# Patient Record
Sex: Female | Born: 1980 | Race: Black or African American | Hispanic: No | Marital: Single | Smoking: Former smoker
Health system: Southern US, Community
[De-identification: ages and names within clinical notes are randomized; demographics above are authoritative.]

## PROBLEM LIST (undated history)

## (undated) DIAGNOSIS — T8484XA Pain due to internal orthopedic prosthetic devices, implants and grafts, initial encounter: Secondary | ICD-10-CM

## (undated) DIAGNOSIS — Z8669 Personal history of other diseases of the nervous system and sense organs: Secondary | ICD-10-CM

## (undated) DIAGNOSIS — G709 Myoneural disorder, unspecified: Secondary | ICD-10-CM

## (undated) DIAGNOSIS — F419 Anxiety disorder, unspecified: Secondary | ICD-10-CM

## (undated) DIAGNOSIS — M898X9 Other specified disorders of bone, unspecified site: Secondary | ICD-10-CM

## (undated) DIAGNOSIS — K219 Gastro-esophageal reflux disease without esophagitis: Secondary | ICD-10-CM

## (undated) DIAGNOSIS — H5712 Ocular pain, left eye: Secondary | ICD-10-CM

## (undated) DIAGNOSIS — I10 Essential (primary) hypertension: Secondary | ICD-10-CM

## (undated) DIAGNOSIS — M1990 Unspecified osteoarthritis, unspecified site: Secondary | ICD-10-CM

## (undated) HISTORY — DX: Personal history of other diseases of the nervous system and sense organs: Z86.69

---

## 2001-05-05 ENCOUNTER — Emergency Department (HOSPITAL_COMMUNITY): Admission: EM | Admit: 2001-05-05 | Discharge: 2001-05-05 | Payer: Self-pay | Admitting: Emergency Medicine

## 2004-07-17 ENCOUNTER — Ambulatory Visit: Payer: Self-pay | Admitting: Gastroenterology

## 2004-07-21 ENCOUNTER — Ambulatory Visit: Payer: Self-pay | Admitting: Gastroenterology

## 2004-10-07 ENCOUNTER — Encounter: Admission: RE | Admit: 2004-10-07 | Discharge: 2004-10-07 | Payer: Self-pay | Admitting: Gastroenterology

## 2004-10-13 ENCOUNTER — Encounter: Admission: RE | Admit: 2004-10-13 | Discharge: 2004-10-13 | Payer: Self-pay | Admitting: Gastroenterology

## 2005-12-10 ENCOUNTER — Emergency Department (HOSPITAL_COMMUNITY): Admission: EM | Admit: 2005-12-10 | Discharge: 2005-12-10 | Payer: Self-pay | Admitting: Emergency Medicine

## 2006-03-22 ENCOUNTER — Encounter: Admission: RE | Admit: 2006-03-22 | Discharge: 2006-03-22 | Payer: Self-pay | Admitting: Obstetrics and Gynecology

## 2006-07-12 HISTORY — PX: CHOLECYSTECTOMY: SHX55

## 2006-08-05 ENCOUNTER — Inpatient Hospital Stay (HOSPITAL_COMMUNITY): Admission: AD | Admit: 2006-08-05 | Discharge: 2006-08-07 | Payer: Self-pay | Admitting: Obstetrics and Gynecology

## 2006-11-14 ENCOUNTER — Emergency Department (HOSPITAL_COMMUNITY): Admission: EM | Admit: 2006-11-14 | Discharge: 2006-11-14 | Payer: Self-pay | Admitting: Family Medicine

## 2007-01-09 ENCOUNTER — Emergency Department (HOSPITAL_COMMUNITY): Admission: EM | Admit: 2007-01-09 | Discharge: 2007-01-09 | Payer: Self-pay | Admitting: Family Medicine

## 2007-02-28 ENCOUNTER — Inpatient Hospital Stay (HOSPITAL_COMMUNITY): Admission: EM | Admit: 2007-02-28 | Discharge: 2007-03-03 | Payer: Self-pay | Admitting: Emergency Medicine

## 2007-02-28 HISTORY — PX: ORIF TIBIA FRACTURE: SHX5416

## 2007-02-28 HISTORY — PX: ORIF ANKLE FRACTURE BIMALLEOLAR: SUR920

## 2007-05-17 ENCOUNTER — Ambulatory Visit (HOSPITAL_COMMUNITY): Admission: RE | Admit: 2007-05-17 | Discharge: 2007-05-17 | Payer: Self-pay | Admitting: Orthopaedic Surgery

## 2007-05-17 ENCOUNTER — Ambulatory Visit: Payer: Self-pay | Admitting: Vascular Surgery

## 2007-05-17 ENCOUNTER — Encounter (INDEPENDENT_AMBULATORY_CARE_PROVIDER_SITE_OTHER): Payer: Self-pay | Admitting: Orthopaedic Surgery

## 2007-09-01 ENCOUNTER — Ambulatory Visit (HOSPITAL_BASED_OUTPATIENT_CLINIC_OR_DEPARTMENT_OTHER): Admission: RE | Admit: 2007-09-01 | Discharge: 2007-09-01 | Payer: Self-pay | Admitting: Orthopedic Surgery

## 2007-09-01 HISTORY — PX: TIBIA HARDWARE REMOVAL: SHX2515

## 2007-09-01 HISTORY — PX: KNEE ARTHROSCOPY: SHX127

## 2010-03-10 ENCOUNTER — Ambulatory Visit: Payer: Self-pay | Admitting: Family Medicine

## 2010-04-16 DIAGNOSIS — K219 Gastro-esophageal reflux disease without esophagitis: Secondary | ICD-10-CM | POA: Insufficient documentation

## 2010-06-03 ENCOUNTER — Ambulatory Visit: Payer: Self-pay | Admitting: Family

## 2010-11-24 NOTE — Discharge Summary (Signed)
NAMEJEMEKA, WAGLER              ACCOUNT NO.:  [id number]   MEDICAL RECORD NO.:  [id number]          PATIENT TYPE:  INP   LOCATION:  1617                         FACILITY:  [facility]   PHYSICIAN:  Harvie Junior, M.D.   DATE OF BIRTH:  [birth year]   DATE OF ADMISSION:  02/28/2007  DATE OF DISCHARGE:  03/03/2007                               DISCHARGE SUMMARY   ADMITTING DIAGNOSES:  1. Bi-malleolar right ankle fracture.  2. Right tib-fib fracture.   DISCHARGE DIAGNOSES:  1. Bi-malleolar right ankle fracture.  2. Right tib-fib fracture.   PROCEDURES IN-HOSPITAL:  1. Open reduction, internal fixation/IM nailing of right tibia      fracture February 28, 2007.  2. Open reduction, internal fixation of bi-malleolar fracture/distal      fibula, right ankle, February 28, 2007.   BRIEF HISTORY:  Ms. Hodak is a 30 year old female who was at work,  walking down some stairs.  She tripped and fell, suffering a right mid-  shaft tibia fracture and distal tibial plafond fracture with fracture of  the lateral fibulas, as well as posterior malleolus.  She was brought to  [facility] emergency room where x-rays confirmed the  diagnosis, and it was felt that she needed surgical intervention on her  right lower extremity, because of severity of her fractures.  She was  admitted for this.  Pertinent laboratory studies show a hemoglobin  admission of 13.1, hematocrit 38.2, WBC 15.5, indices within normal  limits.  Her BUN was within normal limits without abnormalities.  X-ray  of the right tib-fib.  She has a spiral-type fracture involving the  distal fibular shaft above the ankle mortis and also two fractures  involving the tibia.  One is in the mid-tibial shaft region, and the  other is distally.  Postoperative x-rays show near anatomic reduction of  the tibial and fibular fractures, status post ORIF.   BRIEF HISTORY:  Ms. Berling came to the [facility] emergency  room where she was  evaluated.  Neurovascular status was intact to the  right lower extremity, and she had a right mid-shaft tibia fracture with  a distal fibular fracture and tibial plafond fracture.  She was felt to  be in need of ORIF of these significant injuries.  She was brought to  the operating for this.  On the day of admission, preoperatively she  given a gram of Ancef prophylactically.  She underwent surgery as well,  described in Dr. Stacy Gardner operative note.  Postoperatively, she was  placed on the PCA morphine pump for pain control, was given Ancef 1 gram  IV q.8 h. x3 doses.  The leg was elevated and iced.  She was seen by  physical therapy for walker ambulation, non-weightbearing on the right  side.  She made somewhat slow progress in physical therapy and had  normal neurovascular status distally, although at one point stated their  toes were a bit numb.  She had no other injuries.  She was continued on  a PCA morphine pump, was gotten out of bed with physical therapy.  Aspirin was used  for DVT prophylaxis.  On post-op day #2, she was  without complaints.  She was afebrile.  Her vital signs were stable.  Her lungs clear to auscultation.  Her right lower extremity had  neurovascular status intact.  Her IV was converted to a heparin lock,  and a PCA morphine pump was continued.  On post-op day #3, she was  afebrile.  Her vital signs were stable.  She was ambulating safely with  physical therapy with a walker and had moderate pain.  She was  discharged home after being seen by physical therapy, and this was  deemed been safe.   CONDITION ON DISCHARGE:  Improved.   DISCHARGE DIET:  Regular.   MEDICATIONS AT DISCHARGE:  Will include:  1. Robaxin 500 mg 1 to 2 q.6 h. p.r.n. spasm.  2. Enteric aspirin one twice daily for DVT prophylaxis.  3. Oxycodone 5 mg without Tylenol 1-2 tablets q.4-6 h. p.r.n. severe      pain.  4. Vicoprofen 5 mg one to two q.4-6 h p.r.n. less severe pain.   ACTIVITY  STATUS:  May ambulate with a walker, non-weightbearing on the  right, to elevate the leg as much as possible.  She will follow up with  Dr. Luiz Blare in the office in 2 weeks.  She will be out of work.  Should  call if she has any problems.      Marshia Ly, P.A.      Harvie Junior, M.D.  Electronically Signed    JB/MEDQ  D:  05/10/2007  T:  05/11/2007  Job:  [id number]

## 2010-11-24 NOTE — Op Note (Signed)
NAMEJAIMI, BELLE              ACCOUNT NO.:  [id number]   MEDICAL RECORD NO.:  [id number]          PATIENT TYPE:  AMB   LOCATION:  DSC                          FACILITY:  [facility]   PHYSICIAN:  Harvie Junior, M.D.   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   POSTOPERATIVE DIAGNOSES:  1. Painful hardware from intramedullary rodding of the tibia.  2. Chondromalacia of patella.  3. Chondromalacia of lateral tibial plateau.  4. Synovitis medially and laterally.   PRINCIPAL PROCEDURE:  1. Arthroscopy with chondroplasty of the lateral tibial plateau.  2. Chondromalacia of the patellofemoral joints in particular the      lateral patella.  3. Synovectomy of the medial and lateral compartments.  4. Removal of deep hardware medial.  5. Removal of deep hardware lateral.   SURGEON:  Harvie Junior, MD.   ASSISTANT:  Marshia Ly, PA.   ANESTHESIA:  General.   BRIEF HISTORY:  Ms. Gallego is a 30 year old female with a long history  of having had significant pain in the right leg after a severe injury  where she had a tibia fracture, which spiraled down near the distal  joint.  She had an intramedullary rod placed and had done reasonably  well.  She had a little bit of struggle early on with pain but had done  reasonably well.  Pain in the tibia had all but resolved, but she was  having significant pain with the prominence of her tibial screws  inferiorly and was also having pain in the knee, especially over the  medial knee.  An MRI was obtained, which did not show any meniscal  abnormality, but we felt she could have a little bit of chondral  abnormality and felt that she could have some scar tissue relative to  the intramedullary rodding, and we also felt that we  needed to remove  the two screws from her tibia.  The tibia fracture was healed so we felt  certainly good about removing the hardware.   PROCEDURE:  The patient was taken to the operating room and after  adequate anesthesia was obtained with a general anesthetic, the patient  was placed upon the operating table and the right leg was prepped and  draped in the usual sterile fashion.  Following this, the leg was  exsanguinated and the blood pressure cuff inflated to 300 mmHg.  Following this, a small incision was made over the medial tibia and this  was dissected down to the screw, the screw was then removed.  Attention  was then turned laterally where a small incision was made in the  subcutaneous tissue and dissected down to the level of the screw on the  lateral tibia, and then this was removed with the screwdriver.  At this  point, attention was turned to the knee where routine arthroscopic  examination of the knee revealed that the patellofemoral  joint looked  like it tracked reasonably, the articular cartilage looked pretty good,  and over the anterolateral area there was some chondromalacia, which was  debrided.  Attention turned to the medial gutter, which had a fair  amount of scar tissue, this was debrided.  Attention turned to the  medial compartment where the medial femoral condyle looked pristine, the  medial meniscus pristine.  Attention turned to the ACL normal.  The  lateral side showed some grade 2 chondromalacia in the lateral tibial  plateau, this was debrided with a suction shaver back to a smooth stable  rim.  A synovectomy, thorough, was performed anteromedial and  anterolateral and once that was completed attention was turned back to  the knee, which was copiously and thoroughly irrigated and suctioned  dry.  Arthroscopic portals were then closed with a bandage, the distal  incisions were closed with nylon interrupted sutures, a sterile,  compressive dressing was  applied on the knee and then the patient was  taken to the recovery room where she was noted to be in satisfactory  condition.  Estimated blood loss for the procedure was none.      Harvie Junior, M.D.  Electronically Signed     JLG/MEDQ  D:  09/01/2007  T:  09/01/2007  Job:  [id number]

## 2010-11-24 NOTE — Op Note (Signed)
[name], [name]              ACCOUNT NO.:  [id number]   MEDICAL RECORD NO.:  [id number]          PATIENT TYPE:  INP   LOCATION:  1617                         FACILITY:  [facility]   PHYSICIAN:  Harvie Junior, M.D.   DATE OF BIRTH:  Sep 21, 1980   DATE OF PROCEDURE:  02/28/2007  DATE OF DISCHARGE:                               OPERATIVE REPORT   PREOPERATIVE DIAGNOSES:  1. Mid-shaft tibia fracture.  2. Bimalleolar ankle fracture.   EMERGENCY PROCEDURE:  1. Open reduction and internal fixation of mid-shaft tibia fracture      with intramedullary rodding of the tibia with a 10 by 36 mm rod,      locked proximally and distally.  2. Open reduction and internal fixation of bimalleolar ankle fracture      with fixation of the lateral tibia with a seven-hole one-third      tubular plate with interfragmentary fixation.   SURGEON:  Harvie Junior, M.D.   ASSISTANT:  Lianne Cure, P.A.-C.   BRIEF HISTORY:  [name] is a 30 year old female, who was walking down  her stairs in her high heels.  She ultimately tripped and suffered a mid-  shaft tibia fracture.  Unfortunately, she also suffered a distal tibial  plafond fracture with fracture of the lateral fibula, as well as  posterior malleolus.  Ultimately, it was felt that fixation was  appropriate with intramedullary rodding for the tibia fracture and then,  hopefully, we could lock the distal tibial plafond fracture in place  with her distal tibial locking.  This was accomplished and then we  needed to fix the ankle fracture with a plating and screws.  She was  brought to the operating room for this procedure.   DESCRIPTION OF PROCEDURE:  Patient was brought to the operating room.  After adequate anesthesia was obtained without ill effect, patient was  placed on the operating table.  The right leg was prepped and draped in  the usual sterile fashion.  Following this, the leg was exsanguinated.  Tourniquet was inflated to 300 mmHg.   Following this, a small incision  was made, just medial to the patellar tendon., subcutaneous tissue down  to the level of the patellar tendon.  A guide wire was placed centrally  in the tibia, AP and lateral.  This was over-reamed to a level of 14 mm.  Following this, a guide wire was passed across the fracture site, all  the way down to the tibial plafond and this was sequentially reamed to  11.5.  This measured to be a 36 and a 36 rod was advanced across the  fracture site, anatomically reducing the fracture and coming distally to  the plafond.  It was lined up proximally with the jigs, distally with  the free-hand technique, care being taken distally because she had a  nondisplaced tibial plafond fracture, to make sure that we were, in  fact, lining up and not displacing the plafond.   Once the tibia was anatomically fixed, the attention was turned to the  lateral malleolus, where interfragmentary fixation was initially  obtained, followed by  length control with a one-third tubular plate, a  seven-hole, with three cortical screws proximally and three cancellous  screws distally.  Excellent length control was obtained.  The ankle  mortise was anatomic and the posterior malleolus was anatomically  reduced with the posterior splinting.   At this point, the wounds were copiously and thoroughly irrigated and  suctioned dry.  The wounds were then closed proximally with #1 Vicryl,  followed by 2-0 Vicryl and skin staples distally over the ankle wound, 2-  0 Vicryl and skin staples.  The stab wounds for the interlocking screws  were closed with skin staples, as well.   At this point, a well-molded posterior splint, as well as a U splint,  were placed with bulky padding underneath and then the patient was taken  to the recovery room, where she was noted to be in satisfactory  condition.   Estimated blood loss for the procedure was none.      Harvie Junior, M.D.  Electronically  Signed     JLG/MEDQ  D:  02/28/2007  T:  03/01/2007  Job:  [id number]

## 2010-11-27 NOTE — Op Note (Signed)
NAMEJONNA, DITTRICH              ACCOUNT NO.:  [id number]   MEDICAL RECORD NO.:  [id number]          PATIENT TYPE:  INP   LOCATION:  9164                          FACILITY:  [facility]   PHYSICIAN:  [place] B. Earlene Plater, M.D.  DATE OF BIRTH:  Sep 10, 1980   DATE OF PROCEDURE:  08/05/2006  DATE OF DISCHARGE:                               OPERATIVE REPORT   PREOPERATIVE DIAGNOSIS:  1. 36 week intrauterine pregnancy.  2. Premature rupture of membranes.  3. Repetitive severe variable decelerations.   POSTOPERATIVE DIAGNOSIS:  1. 36 week intrauterine pregnancy.  2. Premature rupture of membranes.  3. Repetitive severe variable decelerations.   PROCEDURE:  Vacuum assisted vaginal delivery from a complete/complete,  plus 3, direct OA position.   INDICATIONS FOR PROCEDURE:  Patient with the above history with  repetitive severe variable decelerations dropping by well over 60 beats  a minute with mild associated baseline fetal tachycardia.  Recommended  vacuum to facilitate delivery.  The risks of scalp trauma,  cephalohematoma, and rare intracranial bleeding discussed as well as  potential increase for obstetric laceration.   DESCRIPTION OF PROCEDURE:  The patient was under epidural anesthesia  which was adequate.  She was pushing and was complete/complete, plus 3,  LOT position.  The vertex was manually rotated to direct OA and with one  push, the vertex stayed in this position.  Therefore, the Mityvac  mushroom cup was placed on the flexion point. From the mid green zone,  one pull was necessary to deliver the vertex, no pop-offs encountered.  The nose and mouth suctioned with the bulb, remainder of the infant  delivered without difficulty.  Cord around the neck x1 and cord around  the legs x1.  The cord was clamped and cut and the infant handed off to  the awaiting NICU team.  The baby was immediately vigorous, Apgars were  8 and 9.  The placenta was expelled spontaneously.  There was no  laceration.   Mother and baby were in the delivery room afterwards, doing well, with  no evidence for complication.      Gerri Spore B. Earlene Plater, M.D.  Electronically Signed     WBD/MEDQ  D:  08/05/2006  T:  08/05/2006  Job:  [id number]

## 2011-04-23 LAB — CBC
HCT: 38.2
Hemoglobin: 13.1
MCV: 85
Platelets: 330
RBC: 4.5
WBC: 15.5 — ABNORMAL HIGH

## 2011-04-23 LAB — DIFFERENTIAL
Basophils Absolute: 0
Basophils Relative: 0
Eosinophils Relative: 0
Lymphs Abs: 2
Neutro Abs: 12.9 — ABNORMAL HIGH

## 2011-04-23 LAB — BASIC METABOLIC PANEL
Chloride: 114 — ABNORMAL HIGH
Creatinine, Ser: 0.72
GFR calc Af Amer: >60
Sodium: 142

## 2011-04-28 LAB — POCT URINALYSIS DIP (DEVICE)
Bilirubin Urine: NEGATIVE
Glucose, UA: NEGATIVE
Ketones, ur: NEGATIVE
Nitrite: NEGATIVE
Operator id: 247071
Protein, ur: NEGATIVE
Specific Gravity, Urine: 1.025
Urobilinogen, UA: 0.2
pH: 5.5

## 2011-04-28 LAB — POCT PREGNANCY, URINE: Preg Test, Ur: NEGATIVE

## 2014-09-10 DIAGNOSIS — T8484XA Pain due to internal orthopedic prosthetic devices, implants and grafts, initial encounter: Secondary | ICD-10-CM

## 2014-09-10 DIAGNOSIS — M898X9 Other specified disorders of bone, unspecified site: Secondary | ICD-10-CM

## 2014-09-10 HISTORY — DX: Other specified disorders of bone, unspecified site: M89.8X9

## 2014-09-10 HISTORY — DX: Pain due to internal orthopedic prosthetic devices, implants and grafts, initial encounter: T84.84XA

## 2014-09-25 ENCOUNTER — Other Ambulatory Visit (HOSPITAL_BASED_OUTPATIENT_CLINIC_OR_DEPARTMENT_OTHER): Payer: Self-pay | Admitting: Orthopaedic Surgery

## 2014-10-10 ENCOUNTER — Encounter (HOSPITAL_BASED_OUTPATIENT_CLINIC_OR_DEPARTMENT_OTHER): Payer: Self-pay | Admitting: *Deleted

## 2014-10-10 DIAGNOSIS — H5712 Ocular pain, left eye: Secondary | ICD-10-CM

## 2014-10-10 HISTORY — DX: Ocular pain, left eye: H57.12

## 2014-10-16 ENCOUNTER — Ambulatory Visit (HOSPITAL_BASED_OUTPATIENT_CLINIC_OR_DEPARTMENT_OTHER)
Admission: RE | Admit: 2014-10-16 | Discharge: 2014-10-16 | Disposition: A | Discharge status: Home / Self Care | Payer: Managed Care, Other (non HMO) | Source: Ambulatory Visit | Attending: Orthopaedic Surgery | Admitting: Orthopaedic Surgery

## 2014-10-16 ENCOUNTER — Ambulatory Visit (HOSPITAL_BASED_OUTPATIENT_CLINIC_OR_DEPARTMENT_OTHER): Payer: Managed Care, Other (non HMO) | Admitting: Certified Registered"

## 2014-10-16 ENCOUNTER — Encounter (HOSPITAL_BASED_OUTPATIENT_CLINIC_OR_DEPARTMENT_OTHER): Payer: Self-pay | Admitting: Certified Registered"

## 2014-10-16 ENCOUNTER — Encounter (HOSPITAL_BASED_OUTPATIENT_CLINIC_OR_DEPARTMENT_OTHER)
Admission: RE | Disposition: A | Discharge status: Home / Self Care | Payer: Self-pay | Source: Ambulatory Visit | Attending: Orthopaedic Surgery

## 2014-10-16 DIAGNOSIS — Z9049 Acquired absence of other specified parts of digestive tract: Secondary | ICD-10-CM | POA: Diagnosis not present

## 2014-10-16 DIAGNOSIS — T8484XA Pain due to internal orthopedic prosthetic devices, implants and grafts, initial encounter: Secondary | ICD-10-CM | POA: Insufficient documentation

## 2014-10-16 DIAGNOSIS — M899 Disorder of bone, unspecified: Secondary | ICD-10-CM | POA: Diagnosis not present

## 2014-10-16 DIAGNOSIS — Y838 Other surgical procedures as the cause of abnormal reaction of the patient, or of later complication, without mention of misadventure at the time of the procedure: Secondary | ICD-10-CM | POA: Insufficient documentation

## 2014-10-16 HISTORY — PX: HARDWARE REMOVAL: SHX979

## 2014-10-16 HISTORY — PX: BONE EXOSTOSIS EXCISION: SHX1249

## 2014-10-16 HISTORY — DX: Pain due to internal orthopedic prosthetic devices, implants and grafts, initial encounter: T84.84XA

## 2014-10-16 HISTORY — DX: Other specified disorders of bone, unspecified site: M89.8X9

## 2014-10-16 HISTORY — DX: Ocular pain, left eye: H57.12

## 2014-10-16 LAB — POCT HEMOGLOBIN-HEMACUE: Hemoglobin: 13.2 g/dL (ref 12.0–15.0)

## 2014-10-16 SURGERY — REMOVAL, HARDWARE
Anesthesia: General | Site: Knee | Laterality: Right

## 2014-10-16 MED ORDER — ONDANSETRON 8 MG PO TBDP
8.0000 mg | ORAL_TABLET | Freq: Once | ORAL | Status: AC
  Administered 2014-10-16: 8 mg via ORAL

## 2014-10-16 MED ORDER — FENTANYL CITRATE 0.05 MG/ML IJ SOLN
INTRAMUSCULAR | Status: DC | PRN
  Administered 2014-10-16 (×3): 50 ug via INTRAVENOUS

## 2014-10-16 MED ORDER — FENTANYL CITRATE 0.05 MG/ML IJ SOLN
INTRAMUSCULAR | Status: AC
  Filled 2014-10-16: qty 6

## 2014-10-16 MED ORDER — LACTATED RINGERS IV SOLN
INTRAVENOUS | Status: DC
  Administered 2014-10-16 (×2): via INTRAVENOUS

## 2014-10-16 MED ORDER — MIDAZOLAM HCL 5 MG/5ML IJ SOLN
INTRAMUSCULAR | Status: DC | PRN
  Administered 2014-10-16: 2 mg via INTRAVENOUS

## 2014-10-16 MED ORDER — MIDAZOLAM HCL 2 MG/2ML IJ SOLN: 1.0000 mg | INTRAMUSCULAR | Status: DC | PRN

## 2014-10-16 MED ORDER — CLINDAMYCIN PHOSPHATE 900 MG/50ML IV SOLN
900.0000 mg | INTRAVENOUS | Status: AC
  Administered 2014-10-16: 900 mg via INTRAVENOUS

## 2014-10-16 MED ORDER — MIDAZOLAM HCL 2 MG/2ML IJ SOLN
INTRAMUSCULAR | Status: AC
  Filled 2014-10-16: qty 2

## 2014-10-16 MED ORDER — KETOROLAC TROMETHAMINE 30 MG/ML IJ SOLN: 30.0000 mg | Freq: Once | INTRAMUSCULAR | Status: DC | PRN

## 2014-10-16 MED ORDER — PROPOFOL 10 MG/ML IV BOLUS
INTRAVENOUS | Status: DC | PRN
  Administered 2014-10-16: 180 mg via INTRAVENOUS

## 2014-10-16 MED ORDER — OXYCODONE HCL 5 MG/5ML PO SOLN: 5.0000 mg | Freq: Once | ORAL | Status: DC | PRN

## 2014-10-16 MED ORDER — BUPIVACAINE HCL (PF) 0.5 % IJ SOLN
INTRAMUSCULAR | Status: AC
  Filled 2014-10-16: qty 120

## 2014-10-16 MED ORDER — CLINDAMYCIN PHOSPHATE 900 MG/50ML IV SOLN
INTRAVENOUS | Status: AC
  Filled 2014-10-16: qty 50

## 2014-10-16 MED ORDER — HYDROMORPHONE HCL 1 MG/ML IJ SOLN
0.2500 mg | INTRAMUSCULAR | Status: DC | PRN
  Administered 2014-10-16 (×4): 0.5 mg via INTRAVENOUS

## 2014-10-16 MED ORDER — DEXAMETHASONE SODIUM PHOSPHATE 10 MG/ML IJ SOLN
INTRAMUSCULAR | Status: DC | PRN
  Administered 2014-10-16: 10 mg via INTRAVENOUS

## 2014-10-16 MED ORDER — ONDANSETRON HCL 4 MG/2ML IJ SOLN
INTRAMUSCULAR | Status: DC | PRN
  Administered 2014-10-16: 4 mg via INTRAVENOUS

## 2014-10-16 MED ORDER — MIDAZOLAM HCL 2 MG/ML PO SYRP: 12.0000 mg | ORAL_SOLUTION | Freq: Once | ORAL | Status: DC | PRN

## 2014-10-16 MED ORDER — OXYCODONE HCL 5 MG PO TABS
ORAL_TABLET | ORAL | Status: AC
  Filled 2014-10-16: qty 1

## 2014-10-16 MED ORDER — OXYCODONE HCL 5 MG PO TABS: 5.0000 mg | ORAL_TABLET | Freq: Once | ORAL | Status: DC | PRN

## 2014-10-16 MED ORDER — FENTANYL CITRATE 0.05 MG/ML IJ SOLN: 50.0000 ug | INTRAMUSCULAR | Status: DC | PRN

## 2014-10-16 MED ORDER — LIDOCAINE HCL (CARDIAC) 20 MG/ML IV SOLN
INTRAVENOUS | Status: DC | PRN
  Administered 2014-10-16: 60 mg via INTRAVENOUS

## 2014-10-16 MED ORDER — BUPIVACAINE HCL (PF) 0.5 % IJ SOLN
INTRAMUSCULAR | Status: DC | PRN
  Administered 2014-10-16: 15 mL

## 2014-10-16 MED ORDER — HYDROMORPHONE HCL 1 MG/ML IJ SOLN
INTRAMUSCULAR | Status: AC
  Filled 2014-10-16: qty 1

## 2014-10-16 MED ORDER — HYDROCODONE-ACETAMINOPHEN 5-325 MG PO TABS: 1.0000 | ORAL_TABLET | Freq: Four times a day (QID) | ORAL | Status: DC | PRN

## 2014-10-16 MED ORDER — PROMETHAZINE HCL 25 MG/ML IJ SOLN
6.2500 mg | INTRAMUSCULAR | Status: DC | PRN
Start: 2014-10-16 — End: 2014-10-16

## 2014-10-16 SURGICAL SUPPLY — 74 items
BANDAGE ELASTIC 4 VELCRO ST LF (GAUZE/BANDAGES/DRESSINGS) ×4 IMPLANT
BANDAGE ELASTIC 6 VELCRO ST LF (GAUZE/BANDAGES/DRESSINGS) ×4 IMPLANT
BANDAGE ESMARK 6X9 LF (GAUZE/BANDAGES/DRESSINGS) ×2 IMPLANT
BENZOIN TINCTURE PRP APPL 2/3 (GAUZE/BANDAGES/DRESSINGS) ×4 IMPLANT
BLADE HEX COATED 2.75 (ELECTRODE) IMPLANT
BLADE SURG 15 STRL LF DISP TIS (BLADE) ×4 IMPLANT
BLADE SURG 15 STRL SS (BLADE) ×4
BNDG COHESIVE 3X5 TAN STRL LF (GAUZE/BANDAGES/DRESSINGS) ×4 IMPLANT
BNDG ESMARK 6X9 LF (GAUZE/BANDAGES/DRESSINGS) ×4
CANISTER SUCT 1200ML W/VALVE (MISCELLANEOUS) ×4 IMPLANT
CLOSURE WOUND 1/2 X4 (GAUZE/BANDAGES/DRESSINGS) ×3
COVER BACK TABLE 60X90IN (DRAPES) ×4 IMPLANT
COVER MAYO STAND STRL (DRAPES) ×4 IMPLANT
CUFF TOURNIQUET SINGLE 24IN (TOURNIQUET CUFF) IMPLANT
CUFF TOURNIQUET SINGLE 34IN LL (TOURNIQUET CUFF) ×4 IMPLANT
DECANTER SPIKE VIAL GLASS SM (MISCELLANEOUS) IMPLANT
DRAPE EXTREMITY T 121X128X90 (DRAPE) ×4 IMPLANT
DRAPE OEC MINIVIEW 54X84 (DRAPES) ×4 IMPLANT
DRAPE U 20/CS (DRAPES) ×4 IMPLANT
DRAPE U-SHAPE 47X51 STRL (DRAPES) IMPLANT
DURAPREP 26ML APPLICATOR (WOUND CARE) ×4 IMPLANT
ELECT REM PT RETURN 9FT ADLT (ELECTROSURGICAL) ×4
ELECTRODE REM PT RTRN 9FT ADLT (ELECTROSURGICAL) ×2 IMPLANT
GAUZE SPONGE 4X4 12PLY STRL (GAUZE/BANDAGES/DRESSINGS) ×4 IMPLANT
GAUZE SPONGE 4X4 16PLY XRAY LF (GAUZE/BANDAGES/DRESSINGS) IMPLANT
GAUZE XEROFORM 1X8 LF (GAUZE/BANDAGES/DRESSINGS) ×4 IMPLANT
GLOVE BIOGEL PI IND STRL 7.0 (GLOVE) ×2 IMPLANT
GLOVE BIOGEL PI INDICATOR 7.0 (GLOVE) ×2
GLOVE ECLIPSE 6.5 STRL STRAW (GLOVE) ×4 IMPLANT
GLOVE NEODERM STRL 7.5 LF PF (GLOVE) ×2 IMPLANT
GLOVE SURG NEODERM 7.5  LF PF (GLOVE) ×2
GLOVE SURG SYN 7.5  E (GLOVE) ×2
GLOVE SURG SYN 7.5 E (GLOVE) ×2 IMPLANT
GOWN STRL REIN XL XLG (GOWN DISPOSABLE) ×4 IMPLANT
GOWN STRL REUS W/ TWL LRG LVL3 (GOWN DISPOSABLE) ×2 IMPLANT
GOWN STRL REUS W/TWL LRG LVL3 (GOWN DISPOSABLE) ×2
LIQUID BAND (GAUZE/BANDAGES/DRESSINGS) ×4 IMPLANT
NEEDLE HYPO 22GX1.5 SAFETY (NEEDLE) IMPLANT
NS IRRIG 1000ML POUR BTL (IV SOLUTION) ×4 IMPLANT
PACK BASIN DAY SURGERY FS (CUSTOM PROCEDURE TRAY) ×4 IMPLANT
PAD CAST 3X4 CTTN HI CHSV (CAST SUPPLIES) IMPLANT
PAD CAST 4YDX4 CTTN HI CHSV (CAST SUPPLIES) ×2 IMPLANT
PADDING CAST COTTON 3X4 STRL (CAST SUPPLIES)
PADDING CAST COTTON 4X4 STRL (CAST SUPPLIES) ×2
PADDING CAST COTTON 6X4 STRL (CAST SUPPLIES) ×4 IMPLANT
PADDING CAST SYN 6 (CAST SUPPLIES)
PADDING CAST SYNTHETIC 4 (CAST SUPPLIES)
PADDING CAST SYNTHETIC 4X4 STR (CAST SUPPLIES) IMPLANT
PADDING CAST SYNTHETIC 6X4 NS (CAST SUPPLIES) IMPLANT
PENCIL BUTTON HOLSTER BLD 10FT (ELECTRODE) ×4 IMPLANT
SLEEVE SCD COMPRESS KNEE MED (MISCELLANEOUS) IMPLANT
SPLINT FAST PLASTER 5X30 (CAST SUPPLIES)
SPLINT FIBERGLASS 3X35 (CAST SUPPLIES) IMPLANT
SPLINT FIBERGLASS 4X30 (CAST SUPPLIES) IMPLANT
SPLINT PLASTER CAST FAST 5X30 (CAST SUPPLIES) IMPLANT
SPONGE LAP 18X18 X RAY DECT (DISPOSABLE) ×4 IMPLANT
SPONGE LAP 4X18 X RAY DECT (DISPOSABLE) IMPLANT
STAPLER VISISTAT (STAPLE) IMPLANT
STRIP CLOSURE SKIN 1/2X4 (GAUZE/BANDAGES/DRESSINGS) ×9 IMPLANT
SUCTION FRAZIER TIP 10 FR DISP (SUCTIONS) ×4 IMPLANT
SUT ETHILON 3 0 PS 1 (SUTURE) ×4 IMPLANT
SUT MNCRL AB 4-0 PS2 18 (SUTURE) ×8 IMPLANT
SUT VIC AB 2-0 CT1 27 (SUTURE) ×2
SUT VIC AB 2-0 CT1 TAPERPNT 27 (SUTURE) ×2 IMPLANT
SUT VIC AB 2-0 SH 27 (SUTURE) ×2
SUT VIC AB 2-0 SH 27XBRD (SUTURE) ×2 IMPLANT
SYR BULB 3OZ (MISCELLANEOUS) ×4 IMPLANT
SYR CONTROL 10ML LL (SYRINGE) IMPLANT
TOWEL OR 17X24 6PK STRL BLUE (TOWEL DISPOSABLE) ×4 IMPLANT
TOWEL OR NON WOVEN STRL DISP B (DISPOSABLE) ×4 IMPLANT
TUBE CONNECTING 20'X1/4 (TUBING) ×1
TUBE CONNECTING 20X1/4 (TUBING) ×3 IMPLANT
UNDERPAD 30X30 INCONTINENT (UNDERPADS AND DIAPERS) ×4 IMPLANT
YANKAUER SUCT BULB TIP NO VENT (SUCTIONS) IMPLANT

## 2014-10-16 NOTE — H&P (Signed)
PREOPERATIVE H&P  Chief Complaint: painful hardware right ankle, right knee pain due to bony exostosis over tibial tubercle  HPI: [name] is a 34 y.o. female who presents for surgical treatment of painful hardware right ankle, right knee pain due to bony exostosis over tibial tubercle.  She denies any changes in medical history.  Past Medical History  Diagnosis Date  . Painful orthopaedic hardware 09/2014    right ankle  . Bony exostosis 09/2014    right knee  . Pain, eye, left 10/10/2014    with tingling - states possible shingles; to see PCP   Past Surgical History  Procedure Laterality Date  . Orif tibia fracture Right 02/28/2007  . Orif ankle fracture bimalleolar Right 02/28/2007  . Knee arthroscopy Right 09/01/2007  . Tibia hardware removal Right 09/01/2007  . Cholecystectomy  2008   History   Social History  . Marital Status: Single    Spouse Name: N/A  . Number of Children: N/A  . Years of Education: N/A   Social History Main Topics  . Smoking status: Never Smoker   . Smokeless tobacco: Never Used  . Alcohol Use: Yes     Comment: occasionally  . Drug Use: No  . Sexual Activity: Not on file   Other Topics Concern  . None   Social History Narrative   History reviewed. No pertinent family history. Allergies  Allergen Reactions  . Penicillins Other (See Comments)    UNKNOWN - AS AN INFANT   Prior to Admission medications   Medication Sig Start Date End Date Taking? Authorizing Provider  cyclobenzaprine (FLEXERIL) 10 MG tablet Take 10 mg by mouth 3 (three) times daily as needed for muscle spasms.   Yes Historical Provider, MD  meloxicam (MOBIC) 7.5 MG tablet Take 7.5 mg by mouth 2 (two) times daily.   Yes Historical Provider, MD     Positive ROS: All other systems have been reviewed and were otherwise negative with the exception of those mentioned in the HPI and as above.  Physical Exam: General: Alert, no acute distress Cardiovascular: No pedal  edema Respiratory: No cyanosis, no use of accessory musculature GI: abdomen soft Skin: No lesions in the area of chief complaint Neurologic: Sensation intact distally Psychiatric: Patient is competent for consent with normal mood and affect Lymphatic: no lymphedema  MUSCULOSKELETAL: exam stable  Assessment: painful hardware right ankle, right knee pain due to bony exostosis over tibial tubercle  Plan: Plan for Procedure(s): HARDWARE REMOVAL RIGHT ANKLE EXCISION OF BONY EXOSTOSIS FROM RIGHT KNEE  The risks benefits and alternatives were discussed with the patient including but not limited to the risks of nonoperative treatment, versus surgical intervention including infection, bleeding, nerve injury,  blood clots, cardiopulmonary complications, morbidity, mortality, among others, and they were willing to proceed.   Cheral AlmasXu, Aileene Lanum Michael, MD   10/16/2014 8:15 AM

## 2014-10-16 NOTE — Anesthesia Preprocedure Evaluation (Addendum)
Anesthesia Evaluation  Patient identified by MRN, date of birth, ID band Patient awake    Reviewed: Allergy & Precautions, NPO status , Patient's Chart, lab work & pertinent test results  Airway Mallampati: I  TM Distance: >3 FB Neck ROM: Full    Dental  (+) Teeth Intact, Dental Advisory Given   Pulmonary neg pulmonary ROS,  breath sounds clear to auscultation        Cardiovascular negative cardio ROS  Rhythm:Regular Rate:Normal     Neuro/Psych negative neurological ROS     GI/Hepatic negative GI ROS, Neg liver ROS,   Endo/Other  negative endocrine ROS  Renal/GU negative Renal ROS     Musculoskeletal   Abdominal   Peds  Hematology negative hematology ROS (+)   Anesthesia Other Findings   Reproductive/Obstetrics                            Anesthesia Physical Anesthesia Plan  ASA: I  Anesthesia Plan: General   Post-op Pain Management:    Induction: Intravenous  Airway Management Planned: LMA  Additional Equipment:   Intra-op Plan:   Post-operative Plan:   Informed Consent: I have reviewed the patients History and Physical, chart, labs and discussed the procedure including the risks, benefits and alternatives for the proposed anesthesia with the patient or authorized representative who has indicated his/her understanding and acceptance.   Dental advisory given  Plan Discussed with: CRNA  Anesthesia Plan Comments:         Anesthesia Quick Evaluation

## 2014-10-16 NOTE — Anesthesia Procedure Notes (Signed)
Procedure Name: LMA Insertion Date/Time: 10/16/2014 8:37 AM Performed by: Curly ShoresRAFT, Genesi Stefanko W Pre-anesthesia Checklist: Patient identified, Emergency Drugs available, Suction available and Patient being monitored Patient Re-evaluated:Patient Re-evaluated prior to inductionOxygen Delivery Method: Circle System Utilized Preoxygenation: Pre-oxygenation with 100% oxygen Intubation Type: IV induction Ventilation: Mask ventilation without difficulty LMA: LMA inserted LMA Size: 4.0 Number of attempts: 1 Airway Equipment and Method: Bite block Placement Confirmation: positive ETCO2 and breath sounds checked- equal and bilateral Tube secured with: Tape Dental Injury: Teeth and Oropharynx as per pre-operative assessment

## 2014-10-16 NOTE — Op Note (Signed)
Date of surgery: 10/16/2014  Preoperative diagnosis: 1. Symptomatic hardware of right ankle 2. Symptomatic bony exostosis of right proximal tibia  Postoperative diagnosis: Same  Procedure: 1. Removal of deep implant from right lateral ankle of plates and screws 2. Removal of deep implant from right distal tibia, separate incision 3. Removal of deep implant from medial distal tibia, separate incision 4. Removal of bony exostosis from proximal tibia, separate incision  Surgeon: Glee ArvinMichael Vadhir Mcnay, M.D.  Anesthesia: General  Estimated blood loss: Minimal  Tourniquet time: Less than 30 minutes  Complications: None  Indications for procedure: [name] is a 34 year old female who has symptomatic hardware and bony exostosis from of the right lower extremity. She has failed conservative treatment. She presents today for surgical treatment. She is aware of the risks benefits alternatives to surgery and she wished to proceed.  Description of procedure: The patient was identified in the preoperative holding area. The operative sites were marked by the surgeon confirmed with the patient. She was brought back to the operating room. She was placed supine on table. General anesthesia was induced. Nonsterile tourniquet was placed on the right upper thigh. Right lower extremity was prepped and draped in standard sterile fashion. Timeout was performed preoperative antibiotics given. The extremity was exsanguinated using Esmarch bandage and tourniquet was inflated to 300 mmHg. We first addressed the lateral ankle hardware. We use the previous incision. Full-thickness flaps were created and elevated off of the plate. Once the plates and the screws were fully exposed we sequentially backed out the screws. We were then able to elevate the plate off of the lateral aspect of the fibula with a Therapist, nutritionalreer elevator. We then took out the lag screw posteriorly. We then turned our attention to the distal interlocking screws of the  tibia. A separate incision over the anterior aspect of the distal tibia was used. Blunt dissection was taken down to the level of the screw head. The screw head was cleared of soft tissue. This screw was then also backed out. We then performed the same for the medial distal interlocking screw through a separate incision. We then turned our attention to the bony exostosis. We made a longitudinal incision directly over the nasal stenosis over the proximal tibia. Blunt dissection was carried down to the level of the exostosis. We are able to smooth down the exostosis slightly with the rongeur. I felt that we could not due to extensive amount as offset exostectomy the cause of the attachment of the patellar tendon. I do not want to destabilize this. We then took an x-ray of the distal ankle to confirm that all the hardware had been taken out. The wounds were thoroughly irrigated and closed in layer fashion using 2-0 Vicryl for the subcutaneous layer and a running 4-0 Monocryl for the skin. Dermabond and Steri-Strips strips were applied. Sterile dressings were applied. Tourniquet was deflated. Patient was extubated and transferred to the PACU in stable condition.  Postoperative plan: Patient will be weightbearing as tolerated to right lower extremity. She will take off her dressings in 2-3 days and place Band-Aids over the incisions. We'll see her back in the office in 2 weeks for wound check.  Mayra ReelN. Michael Mireya Meditz, MD [facility] [phone] 9:41 AM

## 2014-10-16 NOTE — Anesthesia Postprocedure Evaluation (Signed)
  Anesthesia Post-op Note  Patient: [name]  Procedure(s) Performed: Procedure(s): HARDWARE REMOVAL RIGHT ANKLE (Right) EXCISION OF BONY EXOSTOSIS FROM RIGHT KNEE (Right)  Patient Location: PACU  Anesthesia Type:General  Level of Consciousness: awake, alert  and oriented  Airway and Oxygen Therapy: Patient Spontanous Breathing  Post-op Pain: mild  Post-op Assessment: Post-op Vital signs reviewed  Post-op Vital Signs: Reviewed  Last Vitals:  Filed Vitals:   10/16/14 1200  BP: 127/74  Pulse: 84  Temp: 36.6 C  Resp: 16    Complications: No apparent anesthesia complications

## 2014-10-16 NOTE — Transfer of Care (Signed)
Immediate Anesthesia Transfer of Care Note  Patient: [name]  Procedure(s) Performed: Procedure(s): HARDWARE REMOVAL RIGHT ANKLE (Right) EXCISION OF BONY EXOSTOSIS FROM RIGHT KNEE (Right)  Patient Location: PACU  Anesthesia Type:General  Level of Consciousness: awake, alert  and patient cooperative  Airway & Oxygen Therapy: Patient Spontanous Breathing and Patient connected to face mask oxygen  Post-op Assessment: Report given to RN, Post -op Vital signs reviewed and stable and Patient moving all extremities  Post vital signs: Reviewed and stable  Last Vitals:  Filed Vitals:   10/16/14 0712  BP: 126/81  Pulse: 85  Temp: 36.8 C  Resp: 20    Complications: No apparent anesthesia complications

## 2014-10-16 NOTE — Discharge Instructions (Signed)
Postoperative instructions:  Weightbearing: as tolerated  Keep your dressing and/or splint clean and dry at all times.  You can remove your dressing on post-operative day #3 and change with a dry/sterile dressing or Band-Aids as needed thereafter.    Incision instructions:  Do not soak your incision for 3 weeks after surgery.  If the incision gets wet, pat dry and do not scrub the incision.  Pain control:  You have been given a prescription to be taken as directed for post-operative pain control.  In addition, elevate the operative extremity above the heart at all times to prevent swelling and throbbing pain.  Take over-the-counter Colace, 100mg  by mouth twice a day while taking narcotic pain medications to help prevent constipation.  Follow up appointments: 1) 10-14 days for suture removal and wound check. 2) Dr. Roda ShuttersXu as scheduled.   -------------------------------------------------------------------------------------------------------------  After Surgery Pain Control:  After your surgery, post-surgical discomfort or pain is likely. This discomfort can last several days to a few weeks. At certain times of the day your discomfort may be more intense.  General Anesthesia:  If you did not receive a nerve block during your surgery, you will need to start taking your pain medication shortly after your surgery and should continue to do so as prescribed by your surgeon.  Pain Medication:  Most commonly we prescribe Vicodin and Percocet for post-operative pain. Both of these medications contain a combination of acetaminophen (Tylenol) and a narcotic to help control pain.   It takes between 30 and 45 minutes before pain medication starts to work. It is important to take your medication before your pain level gets too intense.   Nausea is a common side effect of many pain medications. You will want to eat something before taking your pain medicine to help prevent nausea.   If you are taking a  prescription pain medication that contains acetaminophen, we recommend that you do not take additional over the counter acetaminophen (Tylenol).  Other pain relieving options:   Using a cold pack to ice the affected area a few times a day (15 to 20 minutes at a time) can help to relieve pain, reduce swelling and bruising.   Elevation of the affected area can also help to reduce pain and swelling.     Post Anesthesia Home Care Instructions  Activity: Get plenty of rest for the remainder of the day. A responsible adult should stay with you for 24 hours following the procedure.  For the next 24 hours, DO NOT: -Drive a car -Advertising copywriterperate machinery -Drink alcoholic beverages -Take any medication unless instructed by your physician -Make any legal decisions or sign important papers.  Meals: Start with liquid foods such as gelatin or soup. Progress to regular foods as tolerated. Avoid greasy, spicy, heavy foods. If nausea and/or vomiting occur, drink only clear liquids until the nausea and/or vomiting subsides. Call your physician if vomiting continues.  Special Instructions/Symptoms: Your throat may feel dry or sore from the anesthesia or the breathing tube placed in your throat during surgery. If this causes discomfort, gargle with warm salt water. The discomfort should disappear within 24 hours.  If you had a scopolamine patch placed behind your ear for the management of post- operative nausea and/or vomiting:  1. The medication in the patch is effective for 72 hours, after which it should be removed.  Wrap patch in a tissue and discard in the trash. Wash hands thoroughly with soap and water. 2. You may remove the patch earlier  than 72 hours if you experience unpleasant side effects which may include dry mouth, dizziness or visual disturbances. 3. Avoid touching the patch. Wash your hands with soap and water after contact with the patch.

## 2014-10-17 ENCOUNTER — Encounter (HOSPITAL_BASED_OUTPATIENT_CLINIC_OR_DEPARTMENT_OTHER): Payer: Self-pay | Admitting: Orthopaedic Surgery

## 2015-05-06 DIAGNOSIS — E559 Vitamin D deficiency, unspecified: Secondary | ICD-10-CM | POA: Insufficient documentation

## 2015-09-09 ENCOUNTER — Other Ambulatory Visit: Payer: Self-pay | Admitting: Obstetrics and Gynecology

## 2016-11-26 DIAGNOSIS — G5600 Carpal tunnel syndrome, unspecified upper limb: Secondary | ICD-10-CM | POA: Insufficient documentation

## 2016-12-08 ENCOUNTER — Other Ambulatory Visit: Payer: Self-pay | Admitting: Neurology

## 2016-12-08 DIAGNOSIS — G8929 Other chronic pain: Secondary | ICD-10-CM

## 2016-12-08 DIAGNOSIS — R202 Paresthesia of skin: Secondary | ICD-10-CM

## 2016-12-08 DIAGNOSIS — R200 Anesthesia of skin: Secondary | ICD-10-CM

## 2016-12-08 DIAGNOSIS — M542 Cervicalgia: Principal | ICD-10-CM

## 2016-12-13 ENCOUNTER — Ambulatory Visit: Payer: Managed Care, Other (non HMO)

## 2017-09-29 ENCOUNTER — Encounter: Payer: Self-pay | Admitting: Obstetrics and Gynecology

## 2017-09-29 ENCOUNTER — Ambulatory Visit (INDEPENDENT_AMBULATORY_CARE_PROVIDER_SITE_OTHER): Payer: BLUE CROSS/BLUE SHIELD | Admitting: Obstetrics and Gynecology

## 2017-09-29 VITALS — BP 120/80 | HR 90 | Ht 67.0 in | Wt 183.0 lb

## 2017-09-29 DIAGNOSIS — R2990 Unspecified symptoms and signs involving the nervous system: Secondary | ICD-10-CM

## 2017-09-29 DIAGNOSIS — G519 Disorder of facial nerve, unspecified: Secondary | ICD-10-CM

## 2017-09-29 NOTE — Progress Notes (Signed)
System, Provider Not In   Chief Complaint  Patient presents with  . Consult    problems before and after menstrual cycle    HPI:      [name] is a 37 y.o. No obstetric history on file. who LMP was Patient's last menstrual period was 09/15/2017 (exact date)., presents today for NP eval of neuro sx before and after menses. Pt develops a bumpy rash by her LT eye and then has pain/numbness/mild paralysis LT side of face with affected speech due to numbness. She also has tachycardia and itchy rash on scalp, and this month with dizziness. Sx occur wk before period, resolve for wk during her period, and then recur again for about 7-10 days. Sx each month now (used to not be every cycle but becoming more frequent). Started with rash on LT eye in high school but sx then stopped for a few yrs, restarting about age 67. Facial neuro sx started about a yr ago. Pt with Bells palsy about age 21. Sx have been progressively getting worse in terms of every month and then with sx increase in severity. Pt also with occas chest pain/LT arm and thumb. Pt had Mirena a few yrs ago with sx. Pt changed to OCPs and still had sx, slightly increasing in frequency/intensity. Pt stopped them and sx have persisted.   Pt followed by PCP (Dr. Burnett Long) and neuro (Dr. Malvin Long), but suggested to f/u with GYN due to cyclical sx with menses. Pt has been treated with valrex 1g TID with sx. Sx improved with this initially but now no real change. She also tried valtrex 1g daily as preventive without sx relief.  Pt on lyrica for other MSK issue without any change of facial sx.   Pt has had neg brain MRI per report. Pt occas gets migraines and takes maxalt without relief of facial sx.  Pt's menses are monthly, last 3 days, no BTB, mild dysmen. Not currently sex active. No hx of dyspareunia. [name] has pelvic discomfort.   Past Medical History:  Diagnosis Date  . Bony exostosis 09/2014   right knee  . History of migraine     . Pain, eye, left 10/10/2014   with tingling - states possible shingles; to see PCP  . Painful orthopaedic hardware [facility]) 09/2014   right ankle    Past Surgical History:  Procedure Laterality Date  . BONE EXOSTOSIS EXCISION Right 10/16/2014   Procedure: EXCISION OF BONY EXOSTOSIS FROM RIGHT KNEE;  Surgeon: Tarry Kos, MD;  Location: [facility];  Service: Orthopedics;  Laterality: Right;  . CHOLECYSTECTOMY  2008  . HARDWARE REMOVAL Right 10/16/2014   Procedure: HARDWARE REMOVAL RIGHT ANKLE;  Surgeon: Tarry Kos, MD;  Location: [facility];  Service: Orthopedics;  Laterality: Right;  . KNEE ARTHROSCOPY Right 09/01/2007  . ORIF ANKLE FRACTURE BIMALLEOLAR Right 02/28/2007  . ORIF TIBIA FRACTURE Right 02/28/2007  . TIBIA HARDWARE REMOVAL Right 09/01/2007    Family History  Problem Relation Age of Onset  . Lupus Mother   . Heart disease Father   . Rheum arthritis Father   . Myasthenia gravis Maternal Grandmother   . Diabetes Paternal Grandmother   . Breast cancer Paternal Grandmother   . Colon cancer Paternal Uncle   . Colon cancer Maternal Uncle     Social History   Socioeconomic History  . Marital status: Single    Spouse name: Not on file  . Number of children: Not  on file  . Years of education: Not on file  . Highest education level: Not on file  Occupational History  . Not on file  Social Needs  . Financial resource strain: Not on file  . Food insecurity:    Worry: Not on file    Inability: Not on file  . Transportation needs:    Medical: Not on file    Non-medical: Not on file  Tobacco Use  . Smoking status: Never Smoker  . Smokeless tobacco: Never Used  Substance and Sexual Activity  . Alcohol use: Yes    Comment: occasionally  . Drug use: No  . Sexual activity: Not Currently    Birth control/protection: None  Lifestyle  . Physical activity:    Days per week: 2 days    Minutes per session: 30 min  . Stress: To some extent   Relationships  . Social connections:    Talks on phone: Not on file    Gets together: Not on file    Attends religious service: Not on file    Active member of club or organization: Not on file    Attends meetings of clubs or organizations: Not on file    Relationship status: Not on file  . Intimate partner violence:    Fear of current or ex partner: Not on file    Emotionally abused: Not on file    Physically abused: Not on file    Forced sexual activity: Not on file  Other Topics Concern  . Not on file  Social History Narrative  . Not on file    Outpatient Medications Prior to Visit  Medication Sig Dispense Refill  . citalopram (CELEXA) 10 MG tablet   0  . cyclobenzaprine (FLEXERIL) 10 MG tablet Take by mouth.    . diclofenac sodium (VOLTAREN) 1 % GEL APPLY 2 TO 4 GRAMS AA BID    . HYDROcodone-acetaminophen (NORCO/VICODIN) 5-325 MG tablet Take by mouth.    Marland Kitchen LYRICA 50 MG capsule   2  . propranolol (INDERAL) 20 MG tablet   1  . rizatriptan (MAXALT-MLT) 10 MG disintegrating tablet   6  . valACYclovir (VALTREX) 1000 MG tablet TAKE 1 TAB PO TID X7 DAYS AT ONSET OF SX'S    . cyclobenzaprine (FLEXERIL) 10 MG tablet Take 10 mg by mouth 3 (three) times daily as needed for muscle spasms.    Marland Kitchen HYDROcodone-acetaminophen (NORCO) 5-325 MG per tablet Take 1-2 tablets by mouth every 6 (six) hours as needed. 90 tablet 0  . meloxicam (MOBIC) 7.5 MG tablet Take 7.5 mg by mouth 2 (two) times daily.    . naproxen (NAPROSYN) 500 MG tablet TAKE 1 TABLET BY MOUTH TWICE DAILY AS NEEDED FOR BACK PAIN WITH FOOD. 40 tablet 0   No facility-administered medications prior to visit.     ROS:  Review of Systems  Constitutional: Negative for fatigue, fever and unexpected weight change.  Respiratory: Negative for cough, shortness of breath and wheezing.   Cardiovascular: Positive for chest pain. Negative for palpitations and leg swelling.  Gastrointestinal: Negative for blood in stool, constipation,  diarrhea, nausea and vomiting.  Endocrine: Negative for cold intolerance, heat intolerance and polyuria.  Genitourinary: Negative for dyspareunia, dysuria, flank pain, frequency, genital sores, hematuria, menstrual problem, pelvic pain, urgency, vaginal bleeding, vaginal discharge and vaginal pain.  Musculoskeletal: Positive for arthralgias and joint swelling. Negative for back pain and myalgias.  Skin: Negative for rash.  Neurological: Positive for dizziness, numbness and headaches. Negative for  syncope and light-headedness.  Hematological: Negative for adenopathy.  Psychiatric/Behavioral: Negative for agitation, confusion, sleep disturbance and suicidal ideas. The patient is not nervous/anxious.    BREAST: No symptoms   OBJECTIVE:   Vitals:  BP 120/80   Pulse 90   Ht 5\' 7"  (1.702 m)   Wt 183 lb (83 kg)   LMP 09/15/2017 (Exact Date)   BMI 28.66 kg/m   Physical Exam  Constitutional: She is oriented to person, place, and time and well-developed, well-nourished, and in no distress.  Neck: Normal range of motion. No thyromegaly present.  Pulmonary/Chest: Effort normal.  Musculoskeletal: Normal range of motion.  Lymphadenopathy:    She has no cervical adenopathy.  Neurological: She is alert and oriented to person, place, and time. No cranial nerve deficit. Gait normal.  Skin: Skin is warm and dry.  Psychiatric: Mood, memory, affect and judgment normal.  Vitals reviewed.   Assessment/Plan: Facial neuropathy - Cyclical with menses. Neg brain MRI. Question herpetic, migrainous. Will discuss with Dr. Tiburcio PeaHarris and research and f/u with pt.  Neurosensory deficit    Return if symptoms worsen or fail to improve.  Desirey Keahey B. Quitman Norberto, PA-C 09/30/2017 11:33 AM

## 2017-09-30 ENCOUNTER — Encounter: Payer: Self-pay | Admitting: Obstetrics and Gynecology

## 2017-09-30 NOTE — Patient Instructions (Signed)
I value your feedback and entrusting us with your care. If you get a [facility] patient survey, I would appreciate you taking the time to let us know about your experience today. Thank you! 

## 2017-10-04 ENCOUNTER — Telehealth: Payer: Self-pay | Admitting: Obstetrics and Gynecology

## 2017-10-04 DIAGNOSIS — G510 Bell's palsy: Secondary | ICD-10-CM

## 2017-10-04 DIAGNOSIS — G519 Disorder of facial nerve, unspecified: Secondary | ICD-10-CM

## 2017-10-04 NOTE — Telephone Encounter (Signed)
Pt with a facial palsy cyclically with menstrual cycle. Discussed with Dr. Tiburcio PeaHarris. Can try depo to see if stopping menstrual hormonal changes improves sx. Pt will talk to fam members re: depo since her mom didn't do well with BC. Pt tolerated OCPs well. Pt will call me back.  Also [facility] for further eval since so rare. [facility] but no other further work up done. Pt would like ref.

## 2017-10-06 ENCOUNTER — Encounter: Payer: Self-pay | Admitting: Obstetrics and Gynecology

## 2019-04-27 ENCOUNTER — Other Ambulatory Visit: Payer: Self-pay | Admitting: Family Medicine

## 2019-04-27 DIAGNOSIS — Z1231 Encounter for screening mammogram for malignant neoplasm of breast: Secondary | ICD-10-CM

## 2019-09-18 ENCOUNTER — Ambulatory Visit
Admission: RE | Admit: 2019-09-18 | Discharge: 2019-09-18 | Disposition: A | Discharge status: Home / Self Care | Payer: BC Managed Care – PPO | Source: Ambulatory Visit | Attending: Family Medicine | Admitting: Family Medicine

## 2019-09-18 DIAGNOSIS — Z1231 Encounter for screening mammogram for malignant neoplasm of breast: Secondary | ICD-10-CM | POA: Insufficient documentation

## 2019-09-25 ENCOUNTER — Other Ambulatory Visit: Payer: Self-pay | Admitting: Family Medicine

## 2019-09-25 DIAGNOSIS — R928 Other abnormal and inconclusive findings on diagnostic imaging of breast: Secondary | ICD-10-CM

## 2019-09-25 DIAGNOSIS — N6320 Unspecified lump in the left breast, unspecified quadrant: Secondary | ICD-10-CM

## 2019-10-02 ENCOUNTER — Ambulatory Visit
Admission: RE | Admit: 2019-10-02 | Discharge: 2019-10-02 | Disposition: A | Discharge status: Home / Self Care | Payer: BC Managed Care – PPO | Source: Ambulatory Visit | Attending: Family Medicine | Admitting: Family Medicine

## 2019-10-02 DIAGNOSIS — N6320 Unspecified lump in the left breast, unspecified quadrant: Secondary | ICD-10-CM | POA: Diagnosis present

## 2019-10-02 DIAGNOSIS — R928 Other abnormal and inconclusive findings on diagnostic imaging of breast: Secondary | ICD-10-CM | POA: Diagnosis present

## 2020-06-02 ENCOUNTER — Other Ambulatory Visit: Payer: Self-pay | Admitting: Obstetrics and Gynecology

## 2020-06-02 DIAGNOSIS — E049 Nontoxic goiter, unspecified: Secondary | ICD-10-CM

## 2020-06-13 ENCOUNTER — Other Ambulatory Visit
Admission: RE | Admit: 2020-06-13 | Discharge: 2020-06-13 | Disposition: A | Discharge status: Home / Self Care | Payer: BC Managed Care – PPO | Source: Ambulatory Visit | Attending: Student | Admitting: Student

## 2020-06-13 DIAGNOSIS — M5402 Panniculitis affecting regions of neck and back, cervical region: Secondary | ICD-10-CM | POA: Diagnosis present

## 2020-06-13 DIAGNOSIS — R42 Dizziness and giddiness: Secondary | ICD-10-CM | POA: Diagnosis present

## 2020-06-13 LAB — FIBRIN DERIVATIVES D-DIMER (ARMC ONLY): Fibrin derivatives D-dimer (ARMC): 300.12 ng/mL (FEU) (ref 0.00–499.00)

## 2020-06-13 LAB — TROPONIN I (HIGH SENSITIVITY): Troponin I (High Sensitivity): <2 ng/L (ref <18)

## 2020-07-01 ENCOUNTER — Ambulatory Visit
Admission: RE | Admit: 2020-07-01 | Discharge: 2020-07-01 | Disposition: A | Discharge status: Home / Self Care | Payer: Managed Care, Other (non HMO) | Source: Ambulatory Visit | Attending: Obstetrics and Gynecology | Admitting: Obstetrics and Gynecology

## 2020-07-01 DIAGNOSIS — E049 Nontoxic goiter, unspecified: Secondary | ICD-10-CM

## 2020-10-07 ENCOUNTER — Other Ambulatory Visit: Payer: Self-pay | Admitting: Family Medicine

## 2020-10-07 DIAGNOSIS — Z1231 Encounter for screening mammogram for malignant neoplasm of breast: Secondary | ICD-10-CM

## 2020-10-31 ENCOUNTER — Ambulatory Visit
Admission: RE | Admit: 2020-10-31 | Discharge: 2020-10-31 | Disposition: A | Discharge status: Home / Self Care | Payer: 59 | Source: Ambulatory Visit | Attending: Family Medicine | Admitting: Family Medicine

## 2020-10-31 ENCOUNTER — Other Ambulatory Visit: Payer: Self-pay

## 2020-10-31 DIAGNOSIS — Z1231 Encounter for screening mammogram for malignant neoplasm of breast: Secondary | ICD-10-CM | POA: Diagnosis present

## 2021-06-03 NOTE — Progress Notes (Shared)
[facility] - Clinic Note  06/08/2021     CHIEF COMPLAINT Patient presents for No chief complaint on file.   HISTORY OF PRESENT ILLNESS: [name] is a 40 y.o. female who presents to the clinic today for:     Referring physician: Jerl Mina, MD [address] [facility] [place],  [place] [address]  HISTORICAL INFORMATION:   Selected notes from the MEDICAL RECORD NUMBER    CURRENT MEDICATIONS: No current outpatient medications on file. (Ophthalmic Drugs)   No current facility-administered medications for this visit. (Ophthalmic Drugs)   Current Outpatient Medications (Other)  Medication Sig   citalopram (CELEXA) 10 MG tablet    cyclobenzaprine (FLEXERIL) 10 MG tablet Take by mouth.   diclofenac sodium (VOLTAREN) 1 % GEL APPLY 2 TO 4 GRAMS AA BID   HYDROcodone-acetaminophen (NORCO/VICODIN) 5-325 MG tablet Take by mouth.   LYRICA 50 MG capsule    propranolol (INDERAL) 20 MG tablet    rizatriptan (MAXALT-MLT) 10 MG disintegrating tablet    valACYclovir (VALTREX) 1000 MG tablet TAKE 1 TAB PO TID X7 DAYS AT ONSET OF SX'S   No current facility-administered medications for this visit. (Other)      REVIEW OF SYSTEMS:    ALLERGIES Allergies  Allergen Reactions   Penicillin G Other (See Comments)   Penicillins Other (See Comments)    UNKNOWN - AS AN INFANT    PAST MEDICAL HISTORY Past Medical History:  Diagnosis Date   Bony exostosis 09/2014   right knee   History of migraine    Pain, eye, left 10/10/2014   with tingling - states possible shingles; to see PCP   Painful orthopaedic hardware (HCC) 09/2014   right ankle   Past Surgical History:  Procedure Laterality Date   BONE EXOSTOSIS EXCISION Right 10/16/2014   Procedure: EXCISION OF BONY EXOSTOSIS FROM RIGHT KNEE;  Surgeon: Tarry Kos, MD;  Location: [facility];  Service: Orthopedics;  Laterality: Right;   CHOLECYSTECTOMY  2008   HARDWARE REMOVAL Right  10/16/2014   Procedure: HARDWARE REMOVAL RIGHT ANKLE;  Surgeon: Tarry Kos, MD;  Location: [facility];  Service: Orthopedics;  Laterality: Right;   KNEE ARTHROSCOPY Right 09/01/2007   ORIF ANKLE FRACTURE BIMALLEOLAR Right 02/28/2007   ORIF TIBIA FRACTURE Right 02/28/2007   TIBIA HARDWARE REMOVAL Right 09/01/2007    FAMILY HISTORY Family History  Problem Relation Age of Onset   Lupus Mother    Heart disease Father    Rheum arthritis Father    Myasthenia gravis Maternal Grandmother    Diabetes Paternal Grandmother    Breast cancer Paternal Grandmother 85   Colon cancer Paternal Uncle    Colon cancer Maternal Uncle     SOCIAL HISTORY Social History   Tobacco Use   Smoking status: Never   Smokeless tobacco: Never  Vaping Use   Vaping Use: Never used  Substance Use Topics   Alcohol use: Yes    Comment: occasionally   Drug use: No         OPHTHALMIC EXAM:  Not recorded     IMAGING AND PROCEDURES  Imaging and Procedures for @TODAY @           ASSESSMENT/PLAN:  No diagnosis found.  1.  2.  3.  Ophthalmic Meds Ordered this visit:  No orders of the defined types were placed in this encounter.      No follow-ups on file.  There are no Patient Instructions  on file for this visit.   Explained the diagnoses, plan, and follow up with the patient and they expressed understanding.  Patient expressed understanding of the importance of proper follow up care.   This document serves as a record of services personally performed by Karie Chimera, MD, PhD. It was created on their behalf by Annalee Genta, COMT. The creation of this record is the provider's dictation and/or activities during the visit.  Electronically signed by: Annalee Genta, COMT 06/03/21 9:12 AM    Karie Chimera, M.D., Ph.D. Diseases & Surgery of the Retina and Vitreous [facility]     Abbreviations: M myopia (nearsighted); A astigmatism; H  hyperopia (farsighted); P presbyopia; Mrx spectacle prescription;  CTL contact lenses; OD right eye; OS left eye; OU both eyes  XT exotropia; ET esotropia; PEK punctate epithelial keratitis; PEE punctate epithelial erosions; DES dry eye syndrome; MGD meibomian gland dysfunction; ATs artificial tears; PFAT's preservative free artificial tears; NSC nuclear sclerotic cataract; PSC posterior subcapsular cataract; ERM epi-retinal membrane; PVD posterior vitreous detachment; RD retinal detachment; DM diabetes mellitus; DR diabetic retinopathy; NPDR non-proliferative diabetic retinopathy; PDR proliferative diabetic retinopathy; CSME clinically significant macular edema; DME diabetic macular edema; dbh dot blot hemorrhages; CWS cotton wool spot; POAG primary open angle glaucoma; C/D cup-to-disc ratio; HVF humphrey visual field; GVF goldmann visual field; OCT optical coherence tomography; IOP intraocular pressure; BRVO Branch retinal vein occlusion; CRVO central retinal vein occlusion; CRAO central retinal artery occlusion; BRAO branch retinal artery occlusion; RT retinal tear; SB scleral buckle; PPV pars plana vitrectomy; VH Vitreous hemorrhage; PRP panretinal laser photocoagulation; IVK intravitreal kenalog; VMT vitreomacular traction; MH Macular hole;  NVD neovascularization of the disc; NVE neovascularization elsewhere; AREDS age related eye disease study; ARMD age related macular degeneration; POAG primary open angle glaucoma; EBMD epithelial/anterior basement membrane dystrophy; ACIOL anterior chamber intraocular lens; IOL intraocular lens; PCIOL posterior chamber intraocular lens; Phaco/IOL phacoemulsification with intraocular lens placement; PRK photorefractive keratectomy; LASIK laser assisted in situ keratomileusis; HTN hypertension; DM diabetes mellitus; COPD chronic obstructive pulmonary disease

## 2021-06-08 ENCOUNTER — Encounter (INDEPENDENT_AMBULATORY_CARE_PROVIDER_SITE_OTHER): Payer: 59 | Admitting: Ophthalmology

## 2021-06-08 DIAGNOSIS — H3581 Retinal edema: Secondary | ICD-10-CM

## 2021-06-22 ENCOUNTER — Encounter (INDEPENDENT_AMBULATORY_CARE_PROVIDER_SITE_OTHER): Payer: 59 | Admitting: Ophthalmology

## 2021-07-15 ENCOUNTER — Encounter (INDEPENDENT_AMBULATORY_CARE_PROVIDER_SITE_OTHER): Payer: 59 | Admitting: Ophthalmology

## 2021-07-21 NOTE — Progress Notes (Addendum)
[facility] - Clinic Note  07/22/2021     CHIEF COMPLAINT Patient presents for Retina Evaluation   HISTORY OF PRESENT ILLNESS: [name] is a 41 y.o. female who presents to the clinic today for:   HPI     Retina Evaluation   In left eye.  Onset: Unknown.  Duration: Unknown.  Associated Symptoms Negative for Flashes, Floaters, Distortion, Blind Spot, Pain, Redness, Photophobia, Glare, Trauma, Scalp Tenderness, Jaw Claudication, Shoulder/Hip pain, Fever, Weight Loss and Fatigue.  Context:  distance vision.  Treatments tried include no treatments.  I, the attending physician,  performed the HPI with the patient and updated documentation appropriately.        Comments   41 y/o female pt referred by Dr. 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  Pt had been referred to another retina provider in the past, but did not have a good experience there, so requested to see a new provider.  [facility] good [facility].  Denies pain, FOL, floaters.  No gtts.      Last edited by Rennis ChrisZamora, Leman Martinek, MD on 07/22/2021  9:30 PM.    Pt is here on the referral of Dr. 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 Lorin PicketScott, Dr. Lorin PicketScott sent her to see Dr. Clelia CroftShaw at [facility], pt states she did not have a good experience there and wanted to see someone else, pt states she originally saw Dr. Clelia CroftShaw bc she has numbness and tingling of her left eye, she was told it is the nerve in her face causing it, she also states Dr. 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 [place] [id number]  HISTORICAL INFORMATION:   Selected notes from the MEDICAL RECORD NUMBER Referred by Dr. 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 (Ophthalmic Drugs)   No current facility-administered medications for this visit. (Ophthalmic Drugs)   Current Outpatient Medications (Other)  Medication Sig   cyclobenzaprine (FLEXERIL) 10 MG tablet Take by mouth.   diclofenac sodium (VOLTAREN) 1 % GEL APPLY 2 TO 4 GRAMS AA BID   diclofenac Sodium (VOLTAREN) 1 % GEL APPLY 2 TO 4 GRAMS AA BID   doxycycline (VIBRA-TABS) 100 MG tablet Take 100 mg by mouth 2 (two) times daily.   fluconazole (DIFLUCAN) 150 MG tablet Take 150 mg by mouth once.   gabapentin (NEURONTIN) 100 MG capsule TAKE 1 CAPSULE(100 MG) BY MOUTH THREE TIMES DAILY   hydrochlorothiazide (HYDRODIURIL) 12.5 MG tablet Take by mouth.   HYDROcodone-acetaminophen (NORCO/VICODIN) 5-325 MG tablet Take by mouth.   metoprolol succinate (TOPROL-XL) 50 MG 24 hr tablet Take 1 tablet by mouth 2 (two) times daily.   Metoprolol-Hydrochlorothiazide 50-12.5 MG TB24    sertraline (ZOLOFT) 50 MG tablet Take 50 mg by mouth daily.   citalopram (CELEXA) 10 MG tablet    LYRICA 50 MG capsule  propranolol (INDERAL) 20 MG tablet    rizatriptan (MAXALT-MLT) 10 MG disintegrating tablet    valACYclovir (VALTREX) 1000 MG tablet TAKE 1 TAB PO TID X7 DAYS AT ONSET OF SX'S   No current facility-administered medications for this visit. (Other)   REVIEW OF SYSTEMS: ROS   Positive for: Eyes Negative for: Constitutional, Gastrointestinal, Neurological, Skin, Genitourinary, Musculoskeletal, HENT, Endocrine, Cardiovascular, Respiratory, Psychiatric, Allergic/Imm, Heme/Lymph Last edited by Celine Mans, COA on 07/22/2021  2:14 PM.     ALLERGIES Allergies  Allergen Reactions   Penicillin G Other (See Comments)   Penicillins Other (See Comments)     UNKNOWN - AS AN INFANT    PAST MEDICAL HISTORY Past Medical History:  Diagnosis Date   Bony exostosis 09/2014   right knee   History of migraine    Pain, eye, left 10/10/2014   with tingling - states possible shingles; to see PCP   Painful orthopaedic hardware (HCC) 09/2014   right ankle   Past Surgical History:  Procedure Laterality Date   BONE EXOSTOSIS EXCISION Right 10/16/2014   Procedure: EXCISION OF BONY EXOSTOSIS FROM RIGHT KNEE;  Surgeon: Tarry Kos, MD;  Location: [facility];  Service: Orthopedics;  Laterality: Right;   CHOLECYSTECTOMY  2008   HARDWARE REMOVAL Right 10/16/2014   Procedure: HARDWARE REMOVAL RIGHT ANKLE;  Surgeon: Tarry Kos, MD;  Location: [facility];  Service: Orthopedics;  Laterality: Right;   KNEE ARTHROSCOPY Right 09/01/2007   ORIF ANKLE FRACTURE BIMALLEOLAR Right 02/28/2007   ORIF TIBIA FRACTURE Right 02/28/2007   TIBIA HARDWARE REMOVAL Right 09/01/2007    FAMILY HISTORY Family History  Problem Relation Age of Onset   Lupus Mother    Heart disease Father    Rheum arthritis Father    Myasthenia gravis Maternal Grandmother    Diabetes Paternal Grandmother    Breast cancer Paternal Grandmother 17   Colon cancer Paternal Uncle    Colon cancer Maternal Uncle     SOCIAL HISTORY Social History   Tobacco Use   Smoking status: Never   Smokeless tobacco: Never  Vaping Use   Vaping Use: Never used  Substance Use Topics   Alcohol use: Yes    Comment: occasionally   Drug use: No       OPHTHALMIC EXAM:  Base Eye Exam     Visual Acuity (Snellen - Linear)       Right Left   Dist cc 20/20 20/20    Correction: Glasses         Tonometry (Tonopen, 2:18 PM)       Right Left   Pressure 16 18         Pupils       Dark Light Shape React APD   Right 4 3 Round Brisk None   Left 4 3 Round Brisk None         Visual Fields (Counting fingers)       Left Right    Full Full         Extraocular Movement        Right Left    Full, Ortho Full, Ortho         Neuro/Psych     Oriented x3: Yes   Mood/Affect: Normal         Dilation     Both eyes: 1.0% Mydriacyl, 2.5% Phenylephrine @ 2:18 PM           Slit Lamp and Fundus Exam  Slit Lamp Exam       Right Left   Lids/Lashes mild MGD mild MGD   Conjunctiva/Sclera White and quiet White and quiet   Cornea Mild tear film debris, mild EBMD Mild tear film debris, mild EBMD   Anterior Chamber Deep and quiet Deep and quiet   Iris Round and dilated, +PPM Round and dilated, +PPM   Lens 1-2+ Nuclear sclerosis, 1-2+ Cortical cataract 1-2+ Nuclear sclerosis, 1-2+ Cortical cataract         Fundus Exam       Right Left   Disc Pink and Sharp Pink and Sharp   C/D Ratio 0.3 0.5   Macula Flat, Good foveal reflex Flat, Good foveal reflex, No heme or edema   Vessels mild tortuousity mild tortuousity   Periphery Attached, amelanotic elevated choroidal lesion at 0400 midzone with overlying pigment clumping and shallow SRF, approx 3DDx4-5DD, approx 2mm in height Attached, peripheral schisis IT quad, pigmented cystoid degeneration inferiorly           Refraction     Wearing Rx       Sphere Cylinder Axis   Right [place] Sphere    Left -4.75 +3.50 080    Age: [age]yr   Type: SVL         Manifest Refraction       Sphere Cylinder Axis Dist [facility]   Right [place] Sphere  20/20   Left -4.50 +3.75 083 20/20            IMAGING AND PROCEDURES  Imaging and Procedures for 07/22/2021  OCT, Retina - OU - Both Eyes       Right Eye Quality was good. Central Foveal Thickness: 266. Progression has no prior data. Findings include normal foveal contour, no IRF, vitreomacular adhesion , subretinal fluid (Elevated choridal lesion with +SRF overlying -- inf nasal quad -- caught on widefield).   Left Eye Central Foveal Thickness: 265. Findings include normal foveal contour, vitreomacular adhesion , no SRF, intraretinal fluid (+retinoschisis IT  periphery caught on widefield).   Notes *Images captured and stored on drive  Diagnosis / Impression:  OD: NFP; no IRF/SRF centrally; Elevated choridal lesion with +SRF overlying -- inf nasal quad -- caught on widefield OS: NFP; no IRF/SRF centrally; Peripheral retinoschisis, IT quad, caught on widefield  Clinical management:  See below  Abbreviations: NFP - Normal foveal profile. CME - cystoid macular edema. PED - pigment epithelial detachment. IRF - intraretinal fluid. SRF - subretinal fluid. EZ - ellipsoid zone. ERM - epiretinal membrane. ORA - outer retinal atrophy. ORT - outer retinal tubulation. SRHM - subretinal hyper-reflective material. IRHM - intraretinal hyper-reflective material      Fluorescein Angiography Optos (Transit OD)       Right Eye Progression has no prior data. Early phase findings include staining. Mid/Late phase findings include staining (Focal staining of choroidal lesion nasal to disc).   Left Eye Progression has no prior data. Early phase findings include normal observations. Mid/Late phase findings include normal observations.   Notes **Images stored on drive**  Impression: OD: Focal staining of choroidal lesion nasal to disc OS: normal study     B-Scan Ultrasound - OD - Right Eye       Quality was good. Findings included mass.   Notes **Images stored on drive**  Impression: OD: elevated, dome shaped mass at 0400; hypoechoic signal within lesion              ASSESSMENT/PLAN:    ICD-10-CM   1.  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 Essential hypertension  I10     4. Hypertensive retinopathy of both eyes  H35.033 Fluorescein Angiography Optos (Transit OD)      1. 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 Pearletha Furl, Ocular Oncologist, at [facility] for further evaluation and management  2. Retinoschisis OS  - IT periphery  - discussed findings, prognosis  - recommend monitoring  3,4. Hypertensive retinopathy OU - discussed importance of tight BP control - monitor  Ophthalmic Meds Ordered this visit:  No orders of the defined types were placed in this encounter.    Return for f/u 3-4 months, choroidal lesion OD / retinoschisis OS.  There are no Patient Instructions on file for this visit.  This document serves as a record of services personally performed by Karie Chimera, MD, PhD. It was created on their behalf by De Blanch, an ophthalmic technician. The creation of this record is the provider's dictation and/or activities during the visit.    Electronically signed by: De Blanch, OA, 07/22/21  9:46 PM   Explained the diagnoses, plan, and follow up with the patient and they expressed understanding.  Patient expressed understanding of the importance of proper follow up care.   This document serves as a record of services personally performed by Karie Chimera, MD, PhD. It was created on their behalf by Glee Arvin. Manson Passey, OA an ophthalmic technician. The creation of this record is the provider's dictation and/or activities during the visit.    Electronically signed by: Glee Arvin. Manson Passey, [place] 01.11.2023 9:46 PM   Karie Chimera, M.D., Ph.D. Diseases & Surgery of the Retina and Vitreous [facility]  I have reviewed the above documentation for accuracy and completeness, and I agree with the above. Karie Chimera, M.D., Ph.D. 07/22/21 9:46 PM   Abbreviations: M myopia (nearsighted); A astigmatism; H hyperopia (farsighted); P presbyopia; Mrx spectacle prescription;  CTL contact lenses; OD right eye; OS left eye; OU  both eyes  XT exotropia; ET esotropia; PEK punctate epithelial keratitis; PEE punctate epithelial erosions; DES dry eye syndrome; MGD meibomian gland dysfunction; ATs artificial tears; PFAT's preservative free artificial tears; NSC nuclear sclerotic cataract; PSC posterior subcapsular cataract; ERM epi-retinal membrane; PVD posterior vitreous detachment; RD retinal detachment; DM diabetes mellitus; DR diabetic retinopathy; NPDR non-proliferative diabetic retinopathy; PDR proliferative diabetic retinopathy; CSME clinically significant macular edema; DME diabetic macular edema; dbh dot blot hemorrhages; CWS cotton wool spot; POAG primary open angle glaucoma; C/D cup-to-disc ratio; HVF humphrey visual field; GVF goldmann visual field; OCT optical coherence tomography; IOP intraocular pressure; BRVO Branch retinal vein occlusion; CRVO central retinal vein occlusion; CRAO central retinal artery occlusion; BRAO branch retinal artery occlusion; RT retinal tear; SB scleral buckle; PPV pars plana vitrectomy; VH Vitreous hemorrhage; PRP panretinal laser photocoagulation; IVK intravitreal kenalog; VMT vitreomacular traction; MH Macular hole;  NVD neovascularization of the disc; NVE neovascularization elsewhere; AREDS age related eye disease study; ARMD age related macular degeneration; POAG primary open angle glaucoma; EBMD epithelial/anterior basement  membrane dystrophy; ACIOL anterior chamber intraocular lens; IOL intraocular lens; PCIOL posterior chamber intraocular lens; Phaco/IOL phacoemulsification with intraocular lens placement; [place] photorefractive keratectomy; LASIK laser assisted in situ keratomileusis; HTN hypertension; DM diabetes mellitus; COPD chronic obstructive pulmonary disease

## 2021-07-22 ENCOUNTER — Other Ambulatory Visit: Payer: Self-pay

## 2021-07-22 ENCOUNTER — Encounter (INDEPENDENT_AMBULATORY_CARE_PROVIDER_SITE_OTHER): Payer: Self-pay | Admitting: Ophthalmology

## 2021-07-22 ENCOUNTER — Ambulatory Visit (INDEPENDENT_AMBULATORY_CARE_PROVIDER_SITE_OTHER): Payer: Managed Care, Other (non HMO) | Admitting: Ophthalmology

## 2021-07-22 DIAGNOSIS — I10 Essential (primary) hypertension: Secondary | ICD-10-CM | POA: Diagnosis not present

## 2021-07-22 DIAGNOSIS — H3581 Retinal edema: Secondary | ICD-10-CM

## 2021-07-22 DIAGNOSIS — H35033 Hypertensive retinopathy, bilateral: Secondary | ICD-10-CM | POA: Diagnosis not present

## 2021-07-22 DIAGNOSIS — H318 Other specified disorders of choroid: Secondary | ICD-10-CM

## 2021-07-22 DIAGNOSIS — H33102 Unspecified retinoschisis, left eye: Secondary | ICD-10-CM

## 2021-08-21 ENCOUNTER — Other Ambulatory Visit: Payer: Self-pay | Admitting: Neurology

## 2021-08-21 DIAGNOSIS — G500 Trigeminal neuralgia: Secondary | ICD-10-CM

## 2021-10-19 NOTE — Progress Notes (Shared)
?[facility] - Clinic Note ? ?10/21/2021 ? ?  ? ?CHIEF COMPLAINT ?Patient presents for No chief complaint on file. ? ? ?HISTORY OF PRESENT ILLNESS: ?[name] is a 42 y.o. female who presents to the clinic today for:  ? ? ? ?Referring physician: ?Jerl Mina, MD ?[address] [place] ?[facility] ?[place],  [place] [address] ? ?HISTORICAL INFORMATION:  ? ?Selected notes from the MEDICAL RECORD NUMBER ?Referred by Dr. 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  ? ?CURRENT MEDICATIONS: ?No current outpatient medications on file. (Ophthalmic Drugs)  ? ?No current facility-administered medications for this visit. (Ophthalmic Drugs)  ? ?Current Outpatient Medications (Other)  ?Medication Sig  ? citalopram (CELEXA) 10 MG tablet   ? cyclobenzaprine (FLEXERIL) 10 MG tablet Take by mouth.  ? diclofenac sodium (VOLTAREN) 1 % GEL APPLY 2 TO 4 GRAMS AA BID  ? diclofenac Sodium (VOLTAREN) 1 % GEL APPLY 2 TO 4 GRAMS AA BID  ? doxycycline (VIBRA-TABS) 100 MG tablet Take 100 mg by mouth 2 (two) times daily.  ? fluconazole (DIFLUCAN) 150 MG tablet Take 150 mg by mouth once.  ? gabapentin (NEURONTIN) 100 MG capsule TAKE 1 CAPSULE(100 MG) BY MOUTH THREE TIMES DAILY  ? hydrochlorothiazide (HYDRODIURIL) 12.5 MG tablet Take by mouth.  ? HYDROcodone-acetaminophen (NORCO/VICODIN) 5-325 MG tablet Take by mouth.  ? LYRICA 50 MG capsule   ? metoprolol succinate (TOPROL-XL) 50 MG 24 hr tablet Take 1 tablet by mouth 2 (two) times daily.  ? Metoprolol-Hydrochlorothiazide 50-12.5 MG TB24   ? propranolol (INDERAL) 20 MG tablet   ? rizatriptan (MAXALT-MLT) 10 MG disintegrating tablet   ? sertraline (ZOLOFT) 50 MG tablet Take 50 mg by mouth daily.  ? valACYclovir (VALTREX) 1000 MG tablet TAKE 1 TAB PO TID X7 DAYS AT ONSET OF SX'S  ? ?No current facility-administered medications for this visit. (Other)  ? ?REVIEW OF SYSTEMS: ? ? ?ALLERGIES ?Allergies  ?Allergen Reactions  ? Penicillin G Other (See Comments)   ? Penicillins Other (See Comments)  ?  UNKNOWN - AS AN INFANT  ? ? ?PAST MEDICAL HISTORY ?Past Medical History:  ?Diagnosis Date  ? Bony exostosis 09/2014  ? right knee  ? History of migraine   ? Pain, eye, left 10/10/2014  ? with tingling - states possible shingles; to see PCP  ? Painful orthopaedic hardware [facility]) 09/2014  ? right ankle  ? ?Past Surgical History:  ?Procedure Laterality Date  ? BONE EXOSTOSIS EXCISION Right 10/16/2014  ? Procedure: EXCISION OF BONY EXOSTOSIS FROM RIGHT KNEE;  Surgeon: Tarry Kos, MD;  Location: [facility];  Service: Orthopedics;  Laterality: Right;  ? CHOLECYSTECTOMY  2008  ? HARDWARE REMOVAL Right 10/16/2014  ? Procedure: HARDWARE REMOVAL RIGHT ANKLE;  Surgeon: Tarry Kos, MD;  Location: [facility];  Service: Orthopedics;  Laterality: Right;  ? KNEE ARTHROSCOPY Right 09/01/2007  ? ORIF ANKLE FRACTURE BIMALLEOLAR Right 02/28/2007  ? ORIF TIBIA FRACTURE Right 02/28/2007  ? TIBIA HARDWARE REMOVAL Right 09/01/2007  ? ? ?FAMILY HISTORY ?Family History  ?Problem Relation Age of Onset  ? Lupus Mother   ? Heart disease Father   ? Rheum arthritis Father   ? Myasthenia gravis Maternal Grandmother   ? Diabetes Paternal Grandmother   ? Breast cancer Paternal Grandmother 55  ? Colon cancer Paternal Uncle   ? Colon cancer Maternal Uncle   ? ? ?SOCIAL HISTORY ?Social History  ? ?Tobacco  Use  ? Smoking status: Never  ? Smokeless tobacco: Never  ?Vaping Use  ? Vaping Use: Never used  ?Substance Use Topics  ? Alcohol use: Yes  ?  Comment: occasionally  ? Drug use: No  ?  ? ?  ?OPHTHALMIC EXAM: ? ?Not recorded ?  ? ? ?IMAGING AND PROCEDURES  ?Imaging and Procedures for 10/21/2021 ? ? ? ? ?  ?  ? ?  ?ASSESSMENT/PLAN: ? ?No diagnosis found. ? ? ?1. Choroidal mass OD ? 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 - BCVA 20/20 and pt is asymptomatic OD ? - +brown pigment on surface and +SRF overlying lesion ? - thickness close to 97mm ? - discussed  findings, prognosis ? - will refer to Dr. Pearletha Furl, Ocular Oncologist, at [facility] for further evaluation and management ? ?2. Retinoschisis OS ? - IT periphery ? - discussed findings, prognosis ? - recommend monitoring ? ?3,4. Hypertensive retinopathy OU ?- discussed importance of tight BP control ?- monitor ? ?Ophthalmic Meds Ordered this visit:  ?No orders of the defined types were placed in this encounter. ?  ? ?No follow-ups on file. ? ?There are no Patient Instructions on file for this visit. ? ? ? ? ?Explained the diagnoses, plan, and follow up with the patient and they expressed understanding.  Patient expressed understanding of the importance of proper follow up care.  ? ?This document serves as a record of services personally performed by Karie Chimera, MD, PhD. It was created on their behalf by De Blanch, an ophthalmic technician. The creation of this record is the provider's dictation and/or activities during the visit.   ? ?Electronically signed by: De Blanch, OA, 10/19/21  9:06 AM ? ? ? ?Karie Chimera, M.D., Ph.D. ?Diseases & Surgery of the Retina and Vitreous ?[facility] ? ?I have reviewed the above documentation for accuracy and completeness, and I agree with the above. Karie Chimera, M.D., Ph.D. 07/22/21 9:06 AM ? ? ?Abbreviations: ?M myopia (nearsighted); A astigmatism; H hyperopia (farsighted); P presbyopia; Mrx spectacle prescription;  CTL contact lenses; OD right eye; OS left eye; OU both eyes  XT exotropia; ET esotropia; PEK punctate epithelial keratitis; PEE punctate epithelial erosions; DES dry eye syndrome; MGD meibomian gland dysfunction; ATs artificial tears; PFAT's preservative free artificial tears; NSC nuclear sclerotic cataract; PSC posterior subcapsular cataract; ERM epi-retinal membrane; PVD posterior vitreous detachment; RD retinal detachment; DM diabetes mellitus; DR diabetic retinopathy; NPDR non-proliferative diabetic retinopathy;  PDR proliferative diabetic retinopathy; CSME clinically significant macular edema; DME diabetic macular edema; dbh dot blot hemorrhages; CWS cotton wool spot; POAG primary open angle glaucoma; C/D cup-to-disc ratio; HVF humphrey visual field; GVF goldmann visual field; OCT optical coherence tomography; IOP intraocular pressure; BRVO Branch retinal vein occlusion; CRVO central retinal vein occlusion; CRAO central retinal artery occlusion; BRAO branch retinal artery occlusion; RT retinal tear; SB scleral buckle; PPV pars plana vitrectomy; VH Vitreous hemorrhage; PRP panretinal laser photocoagulation; IVK intravitreal kenalog; VMT vitreomacular traction; MH Macular hole;  NVD neovascularization of the disc; NVE neovascularization elsewhere; AREDS age related eye disease study; ARMD age related macular degeneration; POAG primary open angle glaucoma; EBMD epithelial/anterior basement membrane dystrophy; ACIOL anterior chamber intraocular lens; IOL intraocular lens; PCIOL posterior chamber intraocular lens; Phaco/IOL phacoemulsification with intraocular lens placement; PRK photorefractive keratectomy; LASIK laser assisted in situ keratomileusis; HTN hypertension; DM diabetes mellitus; COPD chronic obstructive pulmonary disease  ?

## 2021-10-21 ENCOUNTER — Encounter (INDEPENDENT_AMBULATORY_CARE_PROVIDER_SITE_OTHER): Payer: Self-pay

## 2021-10-21 ENCOUNTER — Encounter (INDEPENDENT_AMBULATORY_CARE_PROVIDER_SITE_OTHER): Payer: Managed Care, Other (non HMO) | Admitting: Ophthalmology

## 2021-11-04 ENCOUNTER — Other Ambulatory Visit: Payer: Self-pay | Admitting: Family Medicine

## 2021-11-04 DIAGNOSIS — Z1231 Encounter for screening mammogram for malignant neoplasm of breast: Secondary | ICD-10-CM

## 2021-12-23 ENCOUNTER — Ambulatory Visit
Admission: RE | Admit: 2021-12-23 | Discharge: 2021-12-23 | Disposition: A | Discharge status: Home / Self Care | Payer: Managed Care, Other (non HMO) | Source: Ambulatory Visit | Attending: Family Medicine | Admitting: Family Medicine

## 2021-12-23 DIAGNOSIS — Z1231 Encounter for screening mammogram for malignant neoplasm of breast: Secondary | ICD-10-CM | POA: Diagnosis present

## 2022-04-19 DIAGNOSIS — G43109 Migraine with aura, not intractable, without status migrainosus: Secondary | ICD-10-CM | POA: Insufficient documentation

## 2022-04-19 DIAGNOSIS — G500 Trigeminal neuralgia: Secondary | ICD-10-CM | POA: Insufficient documentation

## 2022-09-14 ENCOUNTER — Other Ambulatory Visit: Payer: Self-pay | Admitting: Obstetrics and Gynecology

## 2022-09-20 ENCOUNTER — Encounter (HOSPITAL_BASED_OUTPATIENT_CLINIC_OR_DEPARTMENT_OTHER): Payer: Self-pay | Admitting: Obstetrics and Gynecology

## 2022-09-20 NOTE — Progress Notes (Addendum)
09/20/2022 2:36 PM Spoke w/ via phone for pre-op interview---patient Lab needs dos----I Stat Chem 8, EKG, Urine Pregnancy Lab results------none COVID test -----patient states asymptomatic no test needed Arrive at -------0800 NPO after MN NO Solid Food.  Clear liquids from MN until---0600 Med rec completed Medications to take morning of surgery -----metoprolol only, HOLD HCTZ DOS Diabetic medication -----na Patient instructed no nail polish to be worn day of surgery Patient instructed to bring photo id and insurance card day of surgery Patient aware to have Driver (ride ) / caregiver    for 24 hours after surgery (Home with brother and daughter) Patient Special Instructions -----ensure someone can stay overnight, this is a requirement Pre-Op special Istructions -----na Patient verbalized understanding of instructions that were given at this phone interview. Patient denies shortness of breath, chest pain, fever, cough at this phone interview.  Eithan Beagle, Arville Lime

## 2022-09-21 ENCOUNTER — Other Ambulatory Visit: Payer: Self-pay | Admitting: Obstetrics and Gynecology

## 2022-09-24 ENCOUNTER — Other Ambulatory Visit: Payer: Self-pay

## 2022-09-24 ENCOUNTER — Ambulatory Visit (HOSPITAL_BASED_OUTPATIENT_CLINIC_OR_DEPARTMENT_OTHER): Payer: BC Managed Care – PPO | Admitting: Anesthesiology

## 2022-09-24 ENCOUNTER — Encounter (HOSPITAL_BASED_OUTPATIENT_CLINIC_OR_DEPARTMENT_OTHER): Payer: Self-pay | Admitting: Obstetrics and Gynecology

## 2022-09-24 ENCOUNTER — Encounter (HOSPITAL_BASED_OUTPATIENT_CLINIC_OR_DEPARTMENT_OTHER)
Admission: RE | Disposition: A | Discharge status: Home / Self Care | Payer: Self-pay | Source: Home / Self Care | Attending: Obstetrics and Gynecology

## 2022-09-24 ENCOUNTER — Ambulatory Visit (HOSPITAL_BASED_OUTPATIENT_CLINIC_OR_DEPARTMENT_OTHER)
Admission: RE | Admit: 2022-09-24 | Discharge: 2022-09-24 | Disposition: A | Discharge status: Home / Self Care | Payer: BC Managed Care – PPO | Attending: Obstetrics and Gynecology | Admitting: Obstetrics and Gynecology

## 2022-09-24 DIAGNOSIS — F17210 Nicotine dependence, cigarettes, uncomplicated: Secondary | ICD-10-CM | POA: Diagnosis not present

## 2022-09-24 DIAGNOSIS — N920 Excessive and frequent menstruation with regular cycle: Secondary | ICD-10-CM | POA: Diagnosis not present

## 2022-09-24 DIAGNOSIS — M1990 Unspecified osteoarthritis, unspecified site: Secondary | ICD-10-CM | POA: Insufficient documentation

## 2022-09-24 DIAGNOSIS — N840 Polyp of corpus uteri: Secondary | ICD-10-CM | POA: Insufficient documentation

## 2022-09-24 DIAGNOSIS — I10 Essential (primary) hypertension: Secondary | ICD-10-CM | POA: Diagnosis not present

## 2022-09-24 DIAGNOSIS — Z79899 Other long term (current) drug therapy: Secondary | ICD-10-CM | POA: Insufficient documentation

## 2022-09-24 DIAGNOSIS — F419 Anxiety disorder, unspecified: Secondary | ICD-10-CM | POA: Diagnosis not present

## 2022-09-24 DIAGNOSIS — K219 Gastro-esophageal reflux disease without esophagitis: Secondary | ICD-10-CM | POA: Insufficient documentation

## 2022-09-24 HISTORY — PX: ENDOMETRIAL ABLATION: SHX621

## 2022-09-24 HISTORY — DX: Gastro-esophageal reflux disease without esophagitis: K21.9

## 2022-09-24 HISTORY — DX: Unspecified osteoarthritis, unspecified site: M19.90

## 2022-09-24 HISTORY — DX: Myoneural disorder, unspecified: G70.9

## 2022-09-24 HISTORY — DX: Anxiety disorder, unspecified: F41.9

## 2022-09-24 HISTORY — PX: DILATATION & CURRETTAGE/HYSTEROSCOPY WITH RESECTOCOPE: SHX5572

## 2022-09-24 HISTORY — DX: Essential (primary) hypertension: I10

## 2022-09-24 LAB — BASIC METABOLIC PANEL
Anion gap: 8 (ref 5–15)
BUN: 11 mg/dL (ref 6–20)
CO2: 23 mmol/L (ref 22–32)
Calcium: 9 mg/dL (ref 8.9–10.3)
Chloride: 106 mmol/L (ref 98–111)
Creatinine, Ser: 0.65 mg/dL (ref 0.44–1.00)
GFR, Estimated: >60 mL/min (ref >60)
Glucose, Bld: 104 mg/dL — ABNORMAL HIGH (ref 70–99)
Potassium: 3.7 mmol/L (ref 3.5–5.1)
Sodium: 137 mmol/L (ref 135–145)

## 2022-09-24 LAB — CBC
HCT: 39.1 % (ref 36.0–46.0)
Hemoglobin: 12.5 g/dL (ref 12.0–15.0)
MCH: 29.5 pg (ref 26.0–34.0)
MCHC: 32 g/dL (ref 30.0–36.0)
MCV: 92.2 fL (ref 80.0–100.0)
Platelets: 302 10*3/uL (ref 150–400)
RBC: 4.24 MIL/uL (ref 3.87–5.11)
RDW: 13.9 % (ref 11.5–15.5)
WBC: 6.4 10*3/uL (ref 4.0–10.5)
nRBC: 0 % (ref 0.0–0.2)

## 2022-09-24 LAB — TYPE AND SCREEN
ABO/RH(D): AB POS
Antibody Screen: NEGATIVE

## 2022-09-24 LAB — POCT PREGNANCY, URINE: Preg Test, Ur: NEGATIVE

## 2022-09-24 LAB — ABO/RH: ABO/RH(D): AB POS

## 2022-09-24 SURGERY — DILATATION & CURETTAGE/HYSTEROSCOPY WITH RESECTOCOPE
Anesthesia: General

## 2022-09-24 MED ORDER — ONDANSETRON HCL 4 MG/2ML IJ SOLN
INTRAMUSCULAR | Status: DC | PRN
  Administered 2022-09-24: 4 mg via INTRAVENOUS

## 2022-09-24 MED ORDER — LIDOCAINE 2% (20 MG/ML) 5 ML SYRINGE
INTRAMUSCULAR | Status: DC | PRN
  Administered 2022-09-24: 60 mg via INTRAVENOUS

## 2022-09-24 MED ORDER — DEXAMETHASONE SODIUM PHOSPHATE 10 MG/ML IJ SOLN
INTRAMUSCULAR | Status: AC
  Filled 2022-09-24: qty 1

## 2022-09-24 MED ORDER — DEXAMETHASONE SODIUM PHOSPHATE 10 MG/ML IJ SOLN
INTRAMUSCULAR | Status: DC | PRN
  Administered 2022-09-24 (×2): 5 mg via INTRAVENOUS

## 2022-09-24 MED ORDER — SERTRALINE HCL 100 MG PO TABS: 100.0000 mg | ORAL_TABLET | Freq: Every day | ORAL | 11 refills | Status: AC

## 2022-09-24 MED ORDER — ONDANSETRON HCL 4 MG/2ML IJ SOLN
INTRAMUSCULAR | Status: AC
  Filled 2022-09-24: qty 2

## 2022-09-24 MED ORDER — FENTANYL CITRATE (PF) 100 MCG/2ML IJ SOLN
INTRAMUSCULAR | Status: AC
  Filled 2022-09-24: qty 2

## 2022-09-24 MED ORDER — PROPOFOL 10 MG/ML IV BOLUS
INTRAVENOUS | Status: AC
  Filled 2022-09-24: qty 20

## 2022-09-24 MED ORDER — FENTANYL CITRATE (PF) 100 MCG/2ML IJ SOLN
25.0000 ug | INTRAMUSCULAR | Status: DC | PRN
  Administered 2022-09-24 (×2): 50 ug via INTRAVENOUS

## 2022-09-24 MED ORDER — IBUPROFEN 800 MG PO TABS: 800.0000 mg | ORAL_TABLET | Freq: Three times a day (TID) | ORAL | 4 refills | Status: DC | PRN

## 2022-09-24 MED ORDER — PROPOFOL 10 MG/ML IV BOLUS
INTRAVENOUS | Status: DC | PRN
  Administered 2022-09-24: 200 mg via INTRAVENOUS
  Administered 2022-09-24: 100 mg via INTRAVENOUS

## 2022-09-24 MED ORDER — OXYCODONE HCL 5 MG PO TABS
5.0000 mg | ORAL_TABLET | Freq: Once | ORAL | Status: AC
  Administered 2022-09-24: 5 mg via ORAL

## 2022-09-24 MED ORDER — OXYCODONE HCL 5 MG PO TABS
ORAL_TABLET | ORAL | Status: AC
  Filled 2022-09-24: qty 1

## 2022-09-24 MED ORDER — LIDOCAINE HCL (PF) 2 % IJ SOLN
INTRAMUSCULAR | Status: AC
  Filled 2022-09-24: qty 5

## 2022-09-24 MED ORDER — FENTANYL CITRATE (PF) 100 MCG/2ML IJ SOLN
INTRAMUSCULAR | Status: DC | PRN
  Administered 2022-09-24 (×2): 25 ug via INTRAVENOUS
  Administered 2022-09-24: 50 ug via INTRAVENOUS

## 2022-09-24 MED ORDER — KETOROLAC TROMETHAMINE 30 MG/ML IJ SOLN
INTRAMUSCULAR | Status: DC | PRN
  Administered 2022-09-24: 30 mg via INTRAVENOUS

## 2022-09-24 MED ORDER — MIDAZOLAM HCL 2 MG/2ML IJ SOLN
INTRAMUSCULAR | Status: AC
  Filled 2022-09-24: qty 2

## 2022-09-24 MED ORDER — KETOROLAC TROMETHAMINE 30 MG/ML IJ SOLN
INTRAMUSCULAR | Status: AC
  Filled 2022-09-24: qty 1

## 2022-09-24 MED ORDER — OXYCODONE-ACETAMINOPHEN 5-325 MG PO TABS: 1.0000 | ORAL_TABLET | Freq: Four times a day (QID) | ORAL | 0 refills | Status: AC | PRN

## 2022-09-24 MED ORDER — ACETAMINOPHEN 500 MG PO TABS
1000.0000 mg | ORAL_TABLET | Freq: Once | ORAL | Status: AC
  Administered 2022-09-24: 1000 mg via ORAL

## 2022-09-24 MED ORDER — SODIUM CHLORIDE 0.9 % IR SOLN
Status: DC | PRN
  Administered 2022-09-24: 3000 mL

## 2022-09-24 MED ORDER — LACTATED RINGERS IV SOLN
INTRAVENOUS | Status: DC
  Administered 2022-09-24: 1000 mL via INTRAVENOUS

## 2022-09-24 MED ORDER — ACETAMINOPHEN 500 MG PO TABS
ORAL_TABLET | ORAL | Status: AC
  Filled 2022-09-24: qty 2

## 2022-09-24 MED ORDER — MIDAZOLAM HCL 2 MG/2ML IJ SOLN
INTRAMUSCULAR | Status: DC | PRN
  Administered 2022-09-24: 2 mg via INTRAVENOUS

## 2022-09-24 SURGICAL SUPPLY — 22 items
ABLATOR SURESOUND NOVASURE (ABLATOR) IMPLANT
CATH ROBINSON RED A/P 16FR (CATHETERS) IMPLANT
DEVICE MYOSURE LITE (MISCELLANEOUS) IMPLANT
DEVICE MYOSURE REACH (MISCELLANEOUS) IMPLANT
DILATOR CANAL MILEX (MISCELLANEOUS) IMPLANT
DRSG TELFA 3X8 NADH STRL (GAUZE/BANDAGES/DRESSINGS) ×1 IMPLANT
GAUZE 4X4 16PLY ~~LOC~~+RFID DBL (SPONGE) ×1 IMPLANT
GLOVE BIOGEL PI IND STRL 7.0 (GLOVE) ×1 IMPLANT
GLOVE ECLIPSE 6.5 STRL STRAW (GLOVE) ×1 IMPLANT
GOWN STRL REUS W/TWL LRG LVL3 (GOWN DISPOSABLE) ×1 IMPLANT
KIT PROCEDURE FLUENT (KITS) ×1 IMPLANT
KIT TURNOVER CYSTO (KITS) ×1 IMPLANT
LOOP CUTTING BIPOLAR 21FR (ELECTRODE) IMPLANT
MYOSURE XL FIBROID (MISCELLANEOUS)
PACK VAGINAL MINOR WOMEN LF (CUSTOM PROCEDURE TRAY) ×1 IMPLANT
PAD OB MATERNITY 4.3X12.25 (PERSONAL CARE ITEMS) ×1 IMPLANT
PAD PREP 24X48 CUFFED NSTRL (MISCELLANEOUS) ×1 IMPLANT
SEAL ROD LENS SCOPE MYOSURE (ABLATOR) ×1 IMPLANT
SLEEVE SCD COMPRESS KNEE MED (STOCKING) ×1 IMPLANT
SYSTEM TISS REMOVAL MYOSURE XL (MISCELLANEOUS) IMPLANT
TOWEL OR 17X24 6PK STRL BLUE (TOWEL DISPOSABLE) ×1 IMPLANT
WATER STERILE IRR 500ML POUR (IV SOLUTION) ×1 IMPLANT

## 2022-09-24 NOTE — Anesthesia Postprocedure Evaluation (Signed)
Anesthesia Post Note  Patient: [name]  Procedure(s) Performed: DILATATION & CURETTAGE/HYSTEROSCOPY WITH RESECTOCOPE ENDOMETRIAL ABLATION     Patient location during evaluation: PACU Anesthesia Type: General Level of consciousness: awake and alert Pain management: pain level controlled Vital Signs Assessment: post-procedure vital signs reviewed and stable Respiratory status: spontaneous breathing, nonlabored ventilation and respiratory function stable Cardiovascular status: blood pressure returned to baseline and stable Postop Assessment: no apparent nausea or vomiting Anesthetic complications: no  No notable events documented.  Last Vitals:  Vitals:   09/24/22 1145 09/24/22 1200  BP: (!) 132/94 132/84  Pulse: 81 76  Resp: 13 15  Temp:  37.1 C  SpO2: 96% 100%    Last Pain:  Vitals:   09/24/22 1200  TempSrc:   PainSc: 4                  Katharin Schneider,W. EDMOND

## 2022-09-24 NOTE — Anesthesia Procedure Notes (Signed)
Procedure Name: LMA Insertion Date/Time: 09/24/2022 10:39 AM  Performed by: Suan Halter, CRNAPre-anesthesia Checklist: Patient identified, Emergency Drugs available, Suction available and Patient being monitored Patient Re-evaluated:Patient Re-evaluated prior to induction Oxygen Delivery Method: Circle system utilized Preoxygenation: Pre-oxygenation with 100% oxygen Induction Type: IV induction Ventilation: Mask ventilation without difficulty LMA: LMA inserted LMA Size: 4.0 Number of attempts: 1 Airway Equipment and Method: Bite block Placement Confirmation: positive ETCO2 Tube secured with: Tape Dental Injury: Teeth and Oropharynx as per pre-operative assessment

## 2022-09-24 NOTE — H&P (Signed)
[name] is an 42 y.o. female. BF presents for surgical management of menorrhagia. Sono showed endometrial polyp. Ebx 2 days ago: benign secretory endometrium  Pertinent Gynecological History: Menses:  heavy Bleeding: menorrhagia Contraception: none DES exposure: denies Blood transfusions: none Sexually transmitted diseases: no past history Previous GYN Procedures:  none   Last mammogram: normal Date: 2023 Last pap: normal Date: 2024 OB History: G2, P2   Menstrual History: Menarche age: n/a Patient's last menstrual period was 09/06/2022 (exact date).    Past Medical History:  Diagnosis Date   Anxiety    Arthritis    right ankle   Bony exostosis 09/2014   right knee   GERD (gastroesophageal reflux disease)    History of migraine    Hypertension    Neuromuscular disorder (HCC)    trigeminal nerve pain   Pain, eye, left 10/10/2014   with tingling - states possible shingles; to see PCP   Painful orthopaedic hardware ([facility]) 09/2014   right ankle    Past Surgical History:  Procedure Laterality Date   BONE EXOSTOSIS EXCISION Right 10/16/2014   Procedure: EXCISION OF BONY EXOSTOSIS FROM RIGHT KNEE;  Surgeon: Leandrew Koyanagi, MD;  Location: [facility];  Service: Orthopedics;  Laterality: Right;   CHOLECYSTECTOMY  07/12/2006   HARDWARE REMOVAL Right 10/16/2014   Procedure: HARDWARE REMOVAL RIGHT ANKLE;  Surgeon: Leandrew Koyanagi, MD;  Location: [facility];  Service: Orthopedics;  Laterality: Right;   KNEE ARTHROSCOPY Right 09/01/2007   ORIF ANKLE FRACTURE BIMALLEOLAR Right 02/28/2007   ORIF TIBIA FRACTURE Right 02/28/2007   rod placement right   TIBIA HARDWARE REMOVAL Right 09/01/2007    Family History  Problem Relation Age of Onset   Lupus Mother    Heart disease Father    Rheum arthritis Father    Myasthenia gravis Maternal Grandmother    Diabetes Paternal Grandmother    Breast cancer Paternal Grandmother 52   Colon cancer Paternal  Uncle    Colon cancer Maternal Uncle     Social History:  reports that she has been smoking cigarettes. She has been smoking an average of .25 packs per day. She has never used smokeless tobacco. She reports current alcohol use. She reports that she does not use drugs.  Allergies:  Allergies  Allergen Reactions   Penicillin G Other (See Comments)   Yeast-Related Products Other (See Comments)    States she avoids yeast products as they cause her yeast infections   Penicillins Other (See Comments)    UNKNOWN - AS AN INFANT    Medications Prior to Admission  Medication Sig Dispense Refill Last Dose   carbamazepine (CARBATROL) 300 MG 12 hr capsule Take 300 mg by mouth 2 (two) times daily.   09/23/2022   cholecalciferol (VITAMIN D3) 25 MCG (1000 UNIT) tablet Take 1,000 Units by mouth daily.   09/23/2022   citalopram (CELEXA) 10 MG tablet   0 Past Week   cyclobenzaprine (FLEXERIL) 10 MG tablet Take by mouth.   Past Week   hydrochlorothiazide (HYDRODIURIL) 12.5 MG tablet Take by mouth.   09/23/2022   LYRICA 50 MG capsule Take 100 mg by mouth 2 (two) times daily.  2 09/23/2022   metoprolol succinate (TOPROL-XL) 50 MG 24 hr tablet Take 1 tablet by mouth daily.   09/24/2022 at 0600   sertraline (ZOLOFT) 50 MG tablet Take 50 mg by mouth daily.   Past Month   valACYclovir (VALTREX) 1000 MG tablet Take 1,000 mg by  mouth 2 (two) times daily.   09/23/2022   diclofenac sodium (VOLTAREN) 1 % GEL APPLY 2 TO 4 GRAMS AA BID   More than a month   diclofenac Sodium (VOLTAREN) 1 % GEL APPLY 2 TO 4 GRAMS AA BID   More than a month   doxycycline (VIBRA-TABS) 100 MG tablet Take 100 mg by mouth 2 (two) times daily.   More than a month   fluconazole (DIFLUCAN) 150 MG tablet Take 150 mg by mouth once.   More than a month   gabapentin (NEURONTIN) 100 MG capsule TAKE 1 CAPSULE(100 MG) BY MOUTH THREE TIMES DAILY   More than a month   HYDROcodone-acetaminophen (NORCO/VICODIN) 5-325 MG tablet Take by mouth.   More than a  month   Metoprolol-Hydrochlorothiazide 50-12.5 MG TB24       propranolol (INDERAL) 20 MG tablet   1    rizatriptan (MAXALT-MLT) 10 MG disintegrating tablet   6 More than a month    Review of Systems  All other systems reviewed and are negative.   Blood pressure (!) 129/92, pulse 76, temperature 98.6 F (37 C), temperature source Oral, resp. rate 16, height 5\' 8"  (1.727 m), weight 94.3 kg, last menstrual period 09/06/2022, SpO2 98 %. Physical Exam Constitutional:      Appearance: Normal appearance.  Eyes:     Extraocular Movements: Extraocular movements intact.  Cardiovascular:     Rate and Rhythm: Regular rhythm.     Heart sounds: Normal heart sounds.  Pulmonary:     Breath sounds: Normal breath sounds.  Abdominal:     Palpations: Abdomen is soft.  Genitourinary:    General: Normal vulva.     Comments: VAGINA nl Cervix parous  Uterus anteverted Adnexa nl Musculoskeletal:        General: Normal range of motion.     Cervical back: Neck supple.  Skin:    General: Skin is warm and dry.  Neurological:     General: No focal deficit present.     Mental Status: She is alert and oriented to person, place, and time.  Psychiatric:        Mood and Affect: Mood normal.        Behavior: Behavior normal.     Results for orders placed or performed during the hospital encounter of 09/24/22 (from the past 24 hour(s))  Pregnancy, urine POC     Status: None   Collection Time: 09/24/22  8:16 AM  Result Value Ref Range   Preg Test, Ur NEGATIVE NEGATIVE  Type and screen     Status: None (Preliminary result)   Collection Time: 09/24/22  8:56 AM  Result Value Ref Range   ABO/RH(D) PENDING    Antibody Screen PENDING    Sample Expiration      09/27/2022,2359 Performed at [facility], [place] [address] [place], [place] [address]   CBC     Status: None   Collection Time: 09/24/22  8:56 AM  Result Value Ref Range   WBC 6.4 4.0 - 10.5 K/uL   RBC 4.24 3.87 - 5.11  MIL/uL   Hemoglobin 12.5 12.0 - 15.0 g/dL   HCT 39.1 36.0 - 46.0 %   MCV 92.2 80.0 - 100.0 fL   MCH 29.5 26.0 - 34.0 pg   MCHC 32.0 30.0 - 36.0 g/dL   RDW 13.9 11.5 - 15.5 %   Platelets 302 150 - 400 K/uL   nRBC 0.0 0.0 - 0.2 %  Basic metabolic panel  Status: Abnormal   Collection Time: 09/24/22  8:56 AM  Result Value Ref Range   Sodium 137 135 - 145 mmol/L   Potassium 3.7 3.5 - 5.1 mmol/L   Chloride 106 98 - 111 mmol/L   CO2 23 22 - 32 mmol/L   Glucose, Bld 104 (H) 70 - 99 mg/dL   BUN 11 6 - 20 mg/dL   Creatinine, Ser 0.65 0.44 - 1.00 mg/dL   Calcium 9.0 8.9 - 10.3 mg/dL   GFR, Estimated >60 >60 mL/min   Anion gap 8 5 - 15    No results found.  Assessment/Plan: Menorrhagia Endometrial polyp P) dx hysteroscopy, hysteroscopic resection of endometrial polyp, Novasure endometrial ablation. Procedure explained. Risk of surgery reviewed including infection, bleeding, injury to surrounding organ structures, thermal injury, fluid overload and its mgmt, uterine perforation( 07/998) and its risk, 90% reduction in flow, 10% failure. Not a contraceptive method. All ? answered  Codee Bloodworth A Jazmyn Offner 09/24/2022, 10:24 AM

## 2022-09-24 NOTE — Discharge Instructions (Addendum)
CALL  IF TEMP>100.4, NOTHING PER VAGINA X 2 WK, CALL IF SOAKING A MAXI  PAD EVERY HOUR OR MORE FREQUENTLY Post Anesthesia Home Care Instructions  Activity: Get plenty of rest for the remainder of the day. A responsible adult should stay with you for 24 hours following the procedure.  For the next 24 hours, DO NOT: -Drive a car -Operate machinery -Drink alcoholic beverages -Take any medication unless instructed by your physician -Make any legal decisions or sign important papers.  Meals: Start with liquid foods such as gelatin or soup. Progress to regular foods as tolerated. Avoid greasy, spicy, heavy foods. If nausea and/or vomiting occur, drink only clear liquids until the nausea and/or vomiting subsides. Call your physician if vomiting continues.  Special Instructions/Symptoms: Your throat may feel dry or sore from the anesthesia or the breathing tube placed in your throat during surgery. If this causes discomfort, gargle with warm salt water. The discomfort should disappear within 24 hours.    

## 2022-09-24 NOTE — Brief Op Note (Signed)
09/24/2022  11:12 AM  PATIENT:  [name]  42 y.o. female  PRE-OPERATIVE DIAGNOSIS:  Menorrhagia with regular cycle  POST-OPERATIVE DIAGNOSIS:  Menorrhagia with regular cycle  PROCEDURE:  diagnostic hysteroscopy, hysteroscopic resection of endometrial polyp using myosure, dilation and curettage, Novasure endometrial ablation  SURGEON:  Surgeon(s) and Role:    * Servando Salina, MD - Primary  PHYSICIAN ASSISTANT:   ASSISTANTS: none   ANESTHESIA:   general  EBL:  5 mL   BLOOD ADMINISTERED:none  DRAINS: none   LOCAL MEDICATIONS USED:  NONE  SPECIMEN:  Source of Specimen:  EMC with polyp  DISPOSITION OF SPECIMEN:  PATHOLOGY  COUNTS:  YES  TOURNIQUET:  * No tourniquets in log *  DICTATION: .Other Dictation: Dictation Number [id number]  PLAN OF CARE: Discharge to home after PACU  PATIENT DISPOSITION:  PACU - hemodynamically stable.   Delay start of Pharmacological VTE agent (>24hrs) due to surgical blood loss or risk of bleeding: no

## 2022-09-24 NOTE — Transfer of Care (Signed)
Immediate Anesthesia Transfer of Care Note  Patient: [name]  Procedure(s) Performed: Procedure(s) (LRB): DILATATION & CURETTAGE/HYSTEROSCOPY WITH RESECTOCOPE (N/A) ENDOMETRIAL ABLATION (N/A)  Patient Location: PACU  Anesthesia Type: General  Level of Consciousness: awake, oriented, sedated and patient cooperative  Airway & Oxygen Therapy: Patient Spontanous Breathing and Patient connected to face mask oxygen  Post-op Assessment: Report given to PACU RN and Post -op Vital signs reviewed and stable  Post vital signs: Reviewed and stable  Complications: No apparent anesthesia complications Vitals Value Taken Time  BP 130/83 09/24/22 1115  Temp    Pulse 98 09/24/22 1116  Resp 14 09/24/22 1116  SpO2 100 % 09/24/22 1116  Vitals shown include unvalidated device data.  Last Pain:  Vitals:   09/24/22 0840  TempSrc:   PainSc: 0-No pain      Patients Stated Pain Goal: 6 ([id number])  Complications: No notable events documented.

## 2022-09-24 NOTE — Op Note (Unsigned)
[name], [name] MEDICAL RECORD NO: [id number] ACCOUNT NO: [id number] DATE OF BIRTH: May 28, 1981 FACILITY: [facility] LOCATION: [facility] PHYSICIAN: Huey Scalia A. Garwin Brothers, MD  Operative Report   DATE OF PROCEDURE: 09/24/2022  PREOPERATIVE DIAGNOSES:  Menorrhagia, endometrial polyps.  PROCEDURE:  Diagnostic hysteroscopy, hysteroscopic resection of endometrial polyp using MyoSure, dilation and curettage, NovaSure endometrial ablation.  POSTOPERATIVE DIAGNOSES:  Menorrhagia, endometrial polyps.  ANESTHESIA:  General.  SURGEON:  Chico Cawood A. Garwin Brothers, MD.  ASSISTANT:  None.  DESCRIPTION OF PROCEDURE:  Under adequate general anesthesia, the patient was placed in the dorsal lithotomy position.  She was sterilely prepped and draped in the usual fashion.  The bladder was catheterized, moderate amount of urine.  Examination under  anesthesia revealed anteverted uterus, no adnexal masses could be appreciated.  Bivalve speculum was placed in the vagina.  A single-tooth tenaculum was placed on the anterior lip of the cervix. The uterus sounded to 8 cm.  Endocervical canal was 3.5  cm.  The cervix was then dilated to #[facility] dilator.  Diagnostic hysteroscope was introduced into the uterine cavity, a large endometrial polyp arising from the left lateral posterior wall was noted.  Both tubal ostias could be seen.  Using the  Reach resectoscope, the endometrial polyp was resected, endometrium was also resected.  The resectoscope was then removed.  The NovaSure endometrial apparatus was inserted. A cervical width of 3.5 cm was noted and the machine was enabled with a power of  87 watts, 1 minute and 4 seconds of ablation occurred.  The apparatus was removed.  The diagnostic hysteroscope was reinserted.  Good endometrial ablation was noted throughout. Procedure was felt to be complete at which time all instruments were then  removed from the vagina.  Specimen labeled endometrial curettings with polyp  was sent to pathology.  ESTIMATED BLOOD LOSS:  10 mL.  FLUID DEFICIT:  [id number] mL  COMPLICATIONS:  None.  The patient tolerated the procedure well, was transferred to recovery room in stable condition.   PUS D: 09/24/2022 11:49:51 am T: 09/24/2022 12:00:00 pm  JOB: [id number]

## 2022-09-24 NOTE — Anesthesia Preprocedure Evaluation (Addendum)
Anesthesia Evaluation  Patient identified by MRN, date of birth, ID band Patient awake    Reviewed: Allergy & Precautions, H&P , NPO status , Patient's Chart, lab work & pertinent test results, reviewed documented beta blocker date and time   Airway Mallampati: I  TM Distance: >3 FB Neck ROM: Full    Dental no notable dental hx. (+) Teeth Intact, Dental Advisory Given   Pulmonary Current Smoker and Patient abstained from smoking.   Pulmonary exam normal breath sounds clear to auscultation       Cardiovascular hypertension, Pt. on medications and Pt. on home beta blockers  Rhythm:Regular Rate:Normal     Neuro/Psych   Anxiety     negative neurological ROS     GI/Hepatic Neg liver ROS,GERD  ,,  Endo/Other  negative endocrine ROS    Renal/GU negative Renal ROS  negative genitourinary   Musculoskeletal  (+) Arthritis , Osteoarthritis,    Abdominal   Peds  Hematology negative hematology ROS (+)   Anesthesia Other Findings   Reproductive/Obstetrics negative OB ROS                             Anesthesia Physical Anesthesia Plan  ASA: 2  Anesthesia Plan: General   Post-op Pain Management: Tylenol PO (pre-op)* and Toradol IV (intra-op)*   Induction: Intravenous  PONV Risk Score and Plan: 3 and Ondansetron, Dexamethasone and Midazolam  Airway Management Planned: LMA  Additional Equipment:   Intra-op Plan:   Post-operative Plan: Extubation in OR  Informed Consent: I have reviewed the patients History and Physical, chart, labs and discussed the procedure including the risks, benefits and alternatives for the proposed anesthesia with the patient or authorized representative who has indicated his/her understanding and acceptance.     Dental advisory given  Plan Discussed with: CRNA  Anesthesia Plan Comments:        Anesthesia Quick Evaluation

## 2022-09-27 ENCOUNTER — Encounter (HOSPITAL_BASED_OUTPATIENT_CLINIC_OR_DEPARTMENT_OTHER): Payer: Self-pay | Admitting: Obstetrics and Gynecology

## 2022-09-28 LAB — SURGICAL PATHOLOGY

## 2022-10-13 ENCOUNTER — Other Ambulatory Visit: Payer: Self-pay | Admitting: Family Medicine

## 2022-10-13 ENCOUNTER — Ambulatory Visit
Admission: RE | Admit: 2022-10-13 | Discharge: 2022-10-13 | Disposition: A | Discharge status: Home / Self Care | Payer: BC Managed Care – PPO | Source: Ambulatory Visit | Attending: Family Medicine | Admitting: Family Medicine

## 2022-10-13 DIAGNOSIS — M79604 Pain in right leg: Secondary | ICD-10-CM

## 2023-01-05 ENCOUNTER — Other Ambulatory Visit (INDEPENDENT_AMBULATORY_CARE_PROVIDER_SITE_OTHER): Payer: BC Managed Care – PPO

## 2023-01-05 ENCOUNTER — Ambulatory Visit (INDEPENDENT_AMBULATORY_CARE_PROVIDER_SITE_OTHER): Payer: BC Managed Care – PPO | Admitting: Orthopaedic Surgery

## 2023-01-05 DIAGNOSIS — M25571 Pain in right ankle and joints of right foot: Secondary | ICD-10-CM | POA: Diagnosis not present

## 2023-01-05 NOTE — Progress Notes (Signed)
Office Visit Note   Patient: [name]           Date of Birth: 1980/11/26           MRN: [id number] Visit Date: 01/05/2023              Requested by: Jerl Mina, MD [address] [facility] [place],  [place] [address] PCP: Jerl Mina, MD   Assessment & Plan: Visit Diagnoses:  1. Pain in right ankle and joints of right foot     Plan: Impression is chronic and intermittent right ankle pain.  Hard to tell if her symptoms are posttraumatic in nature but clinically it seems that this is what is causing her to have pain.  We have discussed anti-inflammatories as well as physical therapy.  Referral has been made.  She will follow-up with [place] as needed.  Follow-Up Instructions: Return if symptoms worsen or fail to improve.   Orders:  Orders Placed This Encounter  Procedures   XR Ankle Complete Right   XR Foot Complete Right   Ambulatory referral to Physical Therapy   No orders of the defined types were placed in this encounter.     Procedures: No procedures performed   Clinical Data: No additional findings.   Subjective: Chief Complaint  Patient presents with   Right Ankle - Pain    HPI patient is a pleasant 42 year old female who comes in today with right ankle pain.  She is status post right tibial nail with subsequent hardware removal back in 2016 following a fall down a set of stairs.  She was doing well until earlier this year when she sprained her right ankle.  She was seen at [facility] where she states x-rays were done.  These were negative for acute findings.  She was given a home exercise program which did help.  She is now having intermittent discomfort to the anterior ankle and into the lateral foot.  Symptoms are worse with walking as well as at the end of the day where she has associated swelling.  She does not take medication for this.  Review of Systems as detailed in HPI.  All others reviewed and are negative.   Objective: Vital  Signs: There were no vitals taken for this visit.  Physical Exam well-developed well-nourished female no acute distress.  Alert and oriented x 3.  Ortho Exam right ankle exam reveals tenderness to the medial malleolus as well as posterior tibial tendon.  She also has mild tenderness to the anterior ankle and into the fifth metatarsal.  Painless range of motion.  She is neurovascularly intact distally.  Specialty Comments:  No specialty comments available.  Imaging: XR Ankle Complete Right  Result Date: 01/05/2023 No acute fracture noted.  She does have mild degenerative changes throughout the ankle in addition to a healed fibula fracture.  No hardware complication of the tibial nail.  XR Foot Complete Right  Result Date: 01/05/2023 No acute or structural abnormalities    PMFS History: There are no problems to display for this patient.  Past Medical History:  Diagnosis Date   Anxiety    Arthritis    right ankle   Bony exostosis 09/2014   right knee   GERD (gastroesophageal reflux disease)    History of migraine    Hypertension    Neuromuscular disorder (HCC)    trigeminal nerve pain   Pain, eye, left 10/10/2014   with tingling - states possible shingles; to see PCP  Painful orthopaedic hardware [facility]) 09/2014   right ankle    Family History  Problem Relation Age of Onset   Lupus Mother    Heart disease Father    Rheum arthritis Father    Myasthenia gravis Maternal Grandmother    Diabetes Paternal Grandmother    Breast cancer Paternal Grandmother 63   Colon cancer Paternal Uncle    Colon cancer Maternal Uncle     Past Surgical History:  Procedure Laterality Date   BONE EXOSTOSIS EXCISION Right 10/16/2014   Procedure: EXCISION OF BONY EXOSTOSIS FROM RIGHT KNEE;  Surgeon: Tarry Kos, MD;  Location: [facility];  Service: Orthopedics;  Laterality: Right;   CHOLECYSTECTOMY  07/12/2006   DILATATION & CURRETTAGE/HYSTEROSCOPY WITH RESECTOCOPE N/A  09/24/2022   Procedure: DILATATION & CURETTAGE/HYSTEROSCOPY WITH RESECTOCOPE;  Surgeon: Maxie Better, MD;  Location: [facility];  Service: Gynecology;  Laterality: N/A;   ENDOMETRIAL ABLATION N/A 09/24/2022   Procedure: ENDOMETRIAL ABLATION;  Surgeon: Maxie Better, MD;  Location: [facility];  Service: Gynecology;  Laterality: N/A;   HARDWARE REMOVAL Right 10/16/2014   Procedure: HARDWARE REMOVAL RIGHT ANKLE;  Surgeon: Tarry Kos, MD;  Location: [facility];  Service: Orthopedics;  Laterality: Right;   KNEE ARTHROSCOPY Right 09/01/2007   ORIF ANKLE FRACTURE BIMALLEOLAR Right 02/28/2007   ORIF TIBIA FRACTURE Right 02/28/2007   rod placement right   TIBIA HARDWARE REMOVAL Right 09/01/2007   Social History   Occupational History   Not on file  Tobacco Use   Smoking status: Some Days    Packs/day: .25    Types: Cigarettes   Smokeless tobacco: Never   Tobacco comments:    Pt. States she occasionally will smoke not everyday  Vaping Use   Vaping Use: Never used  Substance and Sexual Activity   Alcohol use: Yes    Comment: occasionally   Drug use: No   Sexual activity: Not Currently    Birth control/protection: None

## 2023-01-14 ENCOUNTER — Other Ambulatory Visit: Payer: Self-pay | Admitting: Family Medicine

## 2023-01-14 DIAGNOSIS — Z1231 Encounter for screening mammogram for malignant neoplasm of breast: Secondary | ICD-10-CM

## 2023-01-19 ENCOUNTER — Ambulatory Visit
Admission: RE | Admit: 2023-01-19 | Discharge: 2023-01-19 | Disposition: A | Discharge status: Home / Self Care | Payer: BC Managed Care – PPO | Source: Ambulatory Visit | Attending: Family Medicine | Admitting: Family Medicine

## 2023-01-19 ENCOUNTER — Encounter: Payer: Self-pay | Admitting: Radiology

## 2023-01-19 DIAGNOSIS — Z1231 Encounter for screening mammogram for malignant neoplasm of breast: Secondary | ICD-10-CM | POA: Diagnosis present

## 2023-01-31 NOTE — Therapy (Unsigned)
OUTPATIENT PHYSICAL THERAPY ANKLE/FOOT EVALUATION  Patient Name: [name] MRN: [id number] DOB:10-13-1980, 42 y.o., female Today's Date: 02/02/2023  END OF SESSION:  PT End of Session - 02/01/23 1651     Visit Number 1    Number of Visits 17    Date for PT Re-Evaluation 03/29/23    Authorization Type eval: 02/01/23    PT Start Time 1700    PT Stop Time 1745    PT Time Calculation (min) 45 min    Activity Tolerance Patient tolerated treatment well    Behavior During Therapy [facility] for tasks assessed/performed            Past Medical History:  Diagnosis Date   Anxiety    Arthritis    right ankle   Bony exostosis 09/2014   right knee   GERD (gastroesophageal reflux disease)    History of migraine    Hypertension    Neuromuscular disorder (HCC)    trigeminal nerve pain   Pain, eye, left 10/10/2014   with tingling - states possible shingles; to see PCP   Painful orthopaedic hardware (HCC) 09/2014   right ankle   Past Surgical History:  Procedure Laterality Date   BONE EXOSTOSIS EXCISION Right 10/16/2014   Procedure: EXCISION OF BONY EXOSTOSIS FROM RIGHT KNEE;  Surgeon: Tarry Kos, MD;  Location: [facility];  Service: Orthopedics;  Laterality: Right;   CHOLECYSTECTOMY  07/12/2006   DILATATION & CURRETTAGE/HYSTEROSCOPY WITH RESECTOCOPE N/A 09/24/2022   Procedure: DILATATION & CURETTAGE/HYSTEROSCOPY WITH RESECTOCOPE;  Surgeon: Maxie Better, MD;  Location: [facility];  Service: Gynecology;  Laterality: N/A;   ENDOMETRIAL ABLATION N/A 09/24/2022   Procedure: ENDOMETRIAL ABLATION;  Surgeon: Maxie Better, MD;  Location: [facility] [place];  Service: Gynecology;  Laterality: N/A;   HARDWARE REMOVAL Right 10/16/2014   Procedure: HARDWARE REMOVAL RIGHT ANKLE;  Surgeon: Tarry Kos, MD;  Location: [facility];  Service: Orthopedics;  Laterality: Right;   KNEE ARTHROSCOPY Right 09/01/2007   ORIF ANKLE FRACTURE  BIMALLEOLAR Right 02/28/2007   ORIF TIBIA FRACTURE Right 02/28/2007   rod placement right   TIBIA HARDWARE REMOVAL Right 09/01/2007   There are no problems to display for this patient.   PCP: Jerl Mina MD  REFERRING PROVIDER: Gershon Mussel MD  REFERRING DIAG: [phone] (ICD-10-CM) - Pain in right ankle and joints of right foot   Rationale for Evaluation and Treatment: Rehabilitation  THERAPY DIAG: Pain in right ankle and joints of right foot - Plan: PT plan of care cert/re-cert  ONSET DATE: Early 2024  FOLLOW-UP APPT SCHEDULED WITH REFERRING PROVIDER: No    SUBJECTIVE:  SUBJECTIVE STATEMENT:  R ankle/foot pain  PERTINENT HISTORY:  Patient is a pleasant 42 year old female who was referred to physical therapy for right ankle pain. She underwent R ankle ORIF after "shattering my ankle" (trimalleolar fracture) in 2008 when she fell down stairs. She is now status post right tibial nail with subsequent hardware removal (lateral plate for fibula fracture removed) back in 2016. She still has the tibial IM nail in place. Pt was doing well until earlier this year when her R foot and ankle started to hurt. She denies any acute injury and states that the only thing she can think might have contributed to her pain was a change in her footwear. She went to [facility] where plain film radiographs showed no acute changes. She was given a home exercise program which did help however pain still remains so she was seen by Dr. Roda Shutters in [place] who referred her for physical therapy. She is now having intermittent discomfort to the R anterior/anteromedial ankle as well as pain in the distal lateral foot (ankle pain started first followed by foot pain at later date). She has pain most days and symptoms are worse with walking as  well as at the end of the day. She notes occasional anterior/anterolateral swelling in the ankle.    R ankle XR (03/01/2007) IMPRESSION:   Spiral type fracture involving the distal fibular shaft above the ankle mortise. There are also two fractures involving the tibia, one is in the mid-tibial shaft region and the other is distally.   XR Ankle Complete Right (01/05/2023) No acute fracture noted.  She does have mild degenerative changes throughout the ankle in addition to a healed fibula fracture.  No hardware complication of the tibial nail.  PAIN:    Pain Intensity: Present: 0/10, Best: 0/10, Worst: 8/10 Pain location: Anterior and anteromedial ankle "deep soreness", R foot pain over the distal 4th and 5th metatarsals (dull ache).  Pain Quality: dull and sore Radiating: No  Swelling: Yes, anterior/anteromedial ankle; Popping, catching, locking: Yes, occasional "pop" which offers pain relief; Numbness/Tingling: No Focal Weakness: Yes, some R ankle weakness reported Aggravating factors: extended standing/walking, exercising, painful walking after first waking in the morning; Relieving factors: pain medicine, rest (offloading), ice, compression stockings at night and sometimes during the day, no benefit with NSAIDs 24-hour pain behavior: Varies throughout the day. Bad especially first thing in the morning History of prior back, hip, knee, or ankle injury, pain, surgery, or therapy: Yes, history of R ankle surgery. R knee pain and swelling since her fall in 2008. She has also been having R low back pain since her injury. Her back pain occasionally radiates into the R hip but never into the knee or ankle;  Dominant hand: right Imaging: Yes, see history Typical footwear: pt has been wearing sneakers since her R foot/ankle pain started Red flags: Positive for weight gain, Negative for personal history of cancer, chills/fever, night sweats, nausea, vomiting;  PRECAUTIONS: None  WEIGHT BEARING  RESTRICTIONS: No  FALLS: Has patient fallen in last 6 months? No  Living Environment Lives with: lives with their daughter Lives in: House/apartment (two level townhome) Stairs: bedroom upstairs and pt has to occasionally go one step at a time due to the pain;  Prior level of function: Independent  Occupational demands: Gaffer at [employer]  Hobbies: exercising, eating different foods, spending time with friends  Patient Goals: Pt would like to be able to exercise and walk her dog without pain, go  for a walk with her dog without pain    OBJECTIVE:   Patient Surveys  LEFS To be completed FOTO 47, predicted improvement to 2  Cognition Patient is oriented to person, place, and time.  Recent memory is intact.  Remote memory is intact.  Attention span and concentration are intact.  Expressive speech is intact.  Patient's fund of knowledge is within normal limits for educational level.    Gross Musculoskeletal Assessment Bulk: Normal Tone: Normal No trophic changes noted to foot/ankle. No ecchymosis, erythema, or edema noted. No gross ankle/foot deformity noted  GAIT: Full gait assessment deferred  Posture: No gross deficits in seated or standing posture which would contribute to her pain;  AROM AROM (Normal range in degrees) AROM   Hip Right Left  Flexion (125)    Extension (15)    Abduction (40)    Adduction     Internal Rotation (45)    External Rotation (45)        Knee    Flexion (135) WNL WNL  Extension (0) WNL WNL      Ankle    Dorsiflexion (20) 12 28  Plantarflexion (50) 50 42  Inversion (35) 18 30  Eversion (15) 8 18  (* = pain; Blank rows = not tested)   LE MMT: MMT (out of 5) Right  Left   Hip flexion 5 5  Hip extension    Hip abduction    Hip adduction    Hip internal rotation    Hip external rotation    Knee flexion 5 5  Knee extension 5 5  Ankle dorsiflexion 4* 5  Ankle plantarflexion 5 5  Ankle inversion 5  5  Ankle eversion 5 5  (* = pain; Blank rows = not tested)  Sensation Grossly intact to light touch throughout bilateral LEs as determined by testing dermatomes L2-S2. Proprioception, stereognosis, and hot/cold testing deferred on this date.  Reflexes Deferred  Muscle Length Deferred  Palpation Pain with palpation to anteromedial ankle near the scar from her surgery. Pain with palpation over tibialis anterior distal muscle belly and tendon. Pain to posterior tibialis tendon but no pain at navicular. Painful at base of 5th metatarsal and in space between 3rd and 4th/4th and 5th metatarsals.  Passive Accessory Motion Deferred  VASCULAR Deferred  SPECIAL TESTS Ligamentous Integrity Anterior Drawer (ATF, 10-15 plantarflexion with anterior translation): Negative Talar Tilt (CFL, inversion): Negative Eversion Stress Test (Deltoid, eversion): Negative External Rotation Test (High ankle, dorsiflexion and external rotation): Negative Squeeze Test (High ankle): Negative Impingment Sign (Dorsiflexion and eversion): Negative  Achilles Integrity Thompson Test: Negative  Fracture Screening Metatarsal Axial Loading: Negative Tap/Percussion Test: Not examined Vibration Test: Not examined  Pronation/Supination Navicular Drop: Not examined  Nerve Test Tarsal Tunnel Test (maximal DF, EV, toe ext with tapping over tarsal tunnel): Not examined Test for Morton's Neuroma (compress metatarsals and mobilize): Positive  Other Windlass Mechanism Test: Negative   Beighton Scale Deferred  TODAY'S TREATMENT: Deferred   PATIENT EDUCATION:  Education details: Plan of care Person educated: Patient Education method: Explanation Education comprehension: verbalized understanding   HOME EXERCISE PROGRAM:  None currently   ASSESSMENT:  CLINICAL IMPRESSION: Patient is a 42 y.o. female who was seen today for physical therapy evaluation and treatment for R ankle and foot pain. She  presents with weak and painful R ankle dorsiflexion. No ligamentous instability however grossly painful to palpation in a variety of areas around anterior, anteromedial, and medial R ankle. She is also painful  to palpation between the 3rd/4th and 4th/5th webspace which seems unrelated to her ankle pain and may represent a possible Morton's neuroma.  OBJECTIVE IMPAIRMENTS: difficulty walking, decreased strength, and pain.   ACTIVITY LIMITATIONS: standing and stairs  PARTICIPATION LIMITATIONS: shopping, community activity, occupation, and exercise  PERSONAL FACTORS: Past/current experiences and 1-2 comorbidities: anxiety and R ankle arthritis  are also affecting patient's functional outcome.   REHAB POTENTIAL: Good  CLINICAL DECISION MAKING: Evolving/moderate complexity  EVALUATION COMPLEXITY: Moderate   GOALS: Goals reviewed with patient? No  SHORT TERM GOALS: Target date: 03/01/2023  Pt will be independent with HEP to improve strength and decrease ankle pain to improve pain-free function at home and work. Baseline:  Goal status: INITIAL   LONG TERM GOALS: Target date: 03/29/2023  Pt will increase FOTO to at least 67 to demonstrate significant improvement in function at home and work related to ankle pain  Baseline: 02/01/23: 47; Goal status: INITIAL  2.  Pt will decrease worst ankle pain by at least 3 points on the NPRS in order to demonstrate clinically significant reduction in ankle pain. Baseline: 02/01/23: worst: 8/10; Goal status: INITIAL  3.  Pt will decrease LEFS score by at least 9 points in order demonstrate clinically significant reduction in ankle pain/disability.       Baseline: 02/01/23: To be completed Goal status: INITIAL  4.  Pt will increase pain-free strength of R ankle dorsiflexion to 5/5 MMT grade in order to demonstrate improvement in strength and function  Baseline: 02/01/23: 4/5, painful Goal status: INITIAL   PLAN: PT FREQUENCY: 1-2x/week  PT  DURATION: 8 weeks  PLANNED INTERVENTIONS: Therapeutic exercises, Therapeutic activity, Neuromuscular re-education, Balance training, Gait training, Patient/Family education, Self Care, Joint mobilization, Joint manipulation, Vestibular training, Canalith repositioning, Orthotic/Fit training, DME instructions, Dry Needling, Electrical stimulation, Spinal manipulation, Spinal mobilization, Cryotherapy, Moist heat, Taping, Traction, Ultrasound, Ionotophoresis 4mg /ml Dexamethasone, Manual therapy, and Re-evaluation.  PLAN FOR NEXT SESSION: Initiate strengthening and manual techniques, issue HEP;   Sharalyn Ink Jayliani Wanner PT, DPT, GCS  Amity Roes, PT 02/02/2023, 2:40 PM

## 2023-02-01 ENCOUNTER — Ambulatory Visit: Payer: BC Managed Care – PPO | Attending: Orthopaedic Surgery

## 2023-02-01 DIAGNOSIS — M25571 Pain in right ankle and joints of right foot: Secondary | ICD-10-CM | POA: Diagnosis present

## 2023-02-03 ENCOUNTER — Ambulatory Visit: Payer: BC Managed Care – PPO

## 2023-02-03 DIAGNOSIS — M25571 Pain in right ankle and joints of right foot: Secondary | ICD-10-CM | POA: Diagnosis not present

## 2023-02-03 NOTE — Therapy (Signed)
OUTPATIENT PHYSICAL THERAPY ANKLE/FOOT TREATMENT  Patient Name: ANIELLA WANDREY MRN: [id number] DOB:21-May-1981, 42 y.o., female Today's Date: 02/05/2023  END OF SESSION:  PT End of Session - 02/05/23 1435     Visit Number 2    Number of Visits 17    Date for PT Re-Evaluation 03/29/23    Authorization Type eval: 02/01/23    PT Start Time 1700    PT Stop Time 1745    PT Time Calculation (min) 45 min    Activity Tolerance Patient tolerated treatment well    Behavior During Therapy [facility] for tasks assessed/performed            Past Medical History:  Diagnosis Date   Anxiety    Arthritis    right ankle   Bony exostosis 09/2014   right knee   GERD (gastroesophageal reflux disease)    History of migraine    Hypertension    Neuromuscular disorder (HCC)    trigeminal nerve pain   Pain, eye, left 10/10/2014   with tingling - states possible shingles; to see PCP   Painful orthopaedic hardware (HCC) 09/2014   right ankle   Past Surgical History:  Procedure Laterality Date   BONE EXOSTOSIS EXCISION Right 10/16/2014   Procedure: EXCISION OF BONY EXOSTOSIS FROM RIGHT KNEE;  Surgeon: Tarry Kos, MD;  Location: [facility];  Service: Orthopedics;  Laterality: Right;   CHOLECYSTECTOMY  07/12/2006   DILATATION & CURRETTAGE/HYSTEROSCOPY WITH RESECTOCOPE N/A 09/24/2022   Procedure: DILATATION & CURETTAGE/HYSTEROSCOPY WITH RESECTOCOPE;  Surgeon: Maxie Better, MD;  Location: [facility] [place];  Service: Gynecology;  Laterality: N/A;   ENDOMETRIAL ABLATION N/A 09/24/2022   Procedure: ENDOMETRIAL ABLATION;  Surgeon: Maxie Better, MD;  Location: [facility];  Service: Gynecology;  Laterality: N/A;   HARDWARE REMOVAL Right 10/16/2014   Procedure: HARDWARE REMOVAL RIGHT ANKLE;  Surgeon: Tarry Kos, MD;  Location: [facility];  Service: Orthopedics;  Laterality: Right;   KNEE ARTHROSCOPY Right 09/01/2007   ORIF ANKLE FRACTURE  BIMALLEOLAR Right 02/28/2007   ORIF TIBIA FRACTURE Right 02/28/2007   rod placement right   TIBIA HARDWARE REMOVAL Right 09/01/2007   There are no problems to display for this patient.   PCP: Jerl Mina MD  REFERRING PROVIDER: Gershon Mussel MD  REFERRING DIAG: [phone] (ICD-10-CM) - Pain in right ankle and joints of right foot   Rationale for Evaluation and Treatment: Rehabilitation  THERAPY DIAG: Pain in right ankle and joints of right foot  ONSET DATE: Early 2024  FOLLOW-UP APPT SCHEDULED WITH REFERRING PROVIDER: No   From Initial Evaluation SUBJECTIVE:  SUBJECTIVE STATEMENT:  R ankle/foot pain  PERTINENT HISTORY:  Patient is a pleasant 42 year old female who was referred to physical therapy for right ankle pain. She underwent R ankle ORIF after "shattering my ankle" (trimalleolar fracture) in 2008 when she fell down stairs. She is now status post right tibial nail with subsequent hardware removal (lateral plate for fibula fracture removed) back in 2016. She still has the tibial IM nail in place. Pt was doing well until earlier this year when her R foot and ankle started to hurt. She denies any acute injury and states that the only thing she can think might have contributed to her pain was a change in her footwear. She went to [facility] where plain film radiographs showed no acute changes. She was given a home exercise program which did help however pain still remains so she was seen by Dr. Roda Shutters in [place] who referred her for physical therapy. She is now having intermittent discomfort to the R anterior/anteromedial ankle as well as pain in the distal lateral foot (ankle pain started first followed by foot pain at later date). She has pain most days and symptoms are worse with walking as well as at the  end of the day. She notes occasional anterior/anterolateral swelling in the ankle.    R ankle XR (03/01/2007) IMPRESSION:   Spiral type fracture involving the distal fibular shaft above the ankle mortise. There are also two fractures involving the tibia, one is in the mid-tibial shaft region and the other is distally.   XR Ankle Complete Right (01/05/2023) No acute fracture noted.  She does have mild degenerative changes throughout the ankle in addition to a healed fibula fracture.  No hardware complication of the tibial nail.  PAIN:    Pain Intensity: Present: 0/10, Best: 0/10, Worst: 8/10 Pain location: Anterior and anteromedial ankle "deep soreness", R foot pain over the distal 4th and 5th metatarsals (dull ache).  Pain Quality: dull and sore Radiating: No  Swelling: Yes, anterior/anteromedial ankle; Popping, catching, locking: Yes, occasional "pop" which offers pain relief; Numbness/Tingling: No Focal Weakness: Yes, some R ankle weakness reported Aggravating factors: extended standing/walking, exercising, painful walking after first waking in the morning; Relieving factors: pain medicine, rest (offloading), ice, compression stockings at night and sometimes during the day, no benefit with NSAIDs 24-hour pain behavior: Varies throughout the day. Bad especially first thing in the morning History of prior back, hip, knee, or ankle injury, pain, surgery, or therapy: Yes, history of R ankle surgery. R knee pain and swelling since her fall in 2008. She has also been having R low back pain since her injury. Her back pain occasionally radiates into the R hip but never into the knee or ankle;  Dominant hand: right Imaging: Yes, see history Typical footwear: pt has been wearing sneakers since her R foot/ankle pain started Red flags: Positive for weight gain, Negative for personal history of cancer, chills/fever, night sweats, nausea, vomiting;  PRECAUTIONS: None  WEIGHT BEARING RESTRICTIONS:  No  FALLS: Has patient fallen in last 6 months? No  Living Environment Lives with: lives with their daughter Lives in: House/apartment (two level townhome) Stairs: bedroom upstairs and pt has to occasionally go one step at a time due to the pain;  Prior level of function: Independent  Occupational demands: Gaffer at [employer]  Hobbies: exercising, eating different foods, spending time with friends  Patient Goals: Pt would like to be able to exercise and walk her dog without pain, go  for a walk with her dog without pain    OBJECTIVE:   Patient Surveys  LEFS To be completed FOTO 47, predicted improvement to 60  Cognition Patient is oriented to person, place, and time.  Recent memory is intact.  Remote memory is intact.  Attention span and concentration are intact.  Expressive speech is intact.  Patient's fund of knowledge is within normal limits for educational level.    Gross Musculoskeletal Assessment Bulk: Normal Tone: Normal No trophic changes noted to foot/ankle. No ecchymosis, erythema, or edema noted. No gross ankle/foot deformity noted  GAIT: Full gait assessment deferred  Posture: No gross deficits in seated or standing posture which would contribute to her pain;  AROM AROM (Normal range in degrees) AROM   Hip Right Left  Flexion (125)    Extension (15)    Abduction (40)    Adduction     Internal Rotation (45)    External Rotation (45)        Knee    Flexion (135) WNL WNL  Extension (0) WNL WNL      Ankle    Dorsiflexion (20) 12 28  Plantarflexion (50) 50 42  Inversion (35) 18 30  Eversion (15) 8 18  (* = pain; Blank rows = not tested)   LE MMT: MMT (out of 5) Right  Left   Hip flexion 5 5  Hip extension    Hip abduction    Hip adduction    Hip internal rotation    Hip external rotation    Knee flexion 5 5  Knee extension 5 5  Ankle dorsiflexion 4* 5  Ankle plantarflexion 5 5  Ankle inversion 5 5  Ankle  eversion 5 5  (* = pain; Blank rows = not tested)  Sensation Grossly intact to light touch throughout bilateral LEs as determined by testing dermatomes L2-S2. Proprioception, stereognosis, and hot/cold testing deferred on this date.  Reflexes Deferred  Muscle Length Deferred  Palpation Pain with palpation to anteromedial ankle near the scar from her surgery. Pain with palpation over tibialis anterior distal muscle belly and tendon. Pain to posterior tibialis tendon but no pain at navicular. Painful at base of 5th metatarsal and in space between 3rd and 4th/4th and 5th metatarsals.  Passive Accessory Motion Deferred  VASCULAR Deferred  SPECIAL TESTS Ligamentous Integrity Anterior Drawer (ATF, 10-15 plantarflexion with anterior translation): Negative Talar Tilt (CFL, inversion): Negative Eversion Stress Test (Deltoid, eversion): Negative External Rotation Test (High ankle, dorsiflexion and external rotation): Negative Squeeze Test (High ankle): Negative Impingment Sign (Dorsiflexion and eversion): Negative  Achilles Integrity Thompson Test: Negative  Fracture Screening Metatarsal Axial Loading: Negative Tap/Percussion Test: Not examined Vibration Test: Not examined  Pronation/Supination Navicular Drop: Not examined  Nerve Test Tarsal Tunnel Test (maximal DF, EV, toe ext with tapping over tarsal tunnel): Not examined Test for Morton's Neuroma (compress metatarsals and mobilize): Positive  Other Windlass Mechanism Test: Negative   Beighton Scale Deferred   TODAY'S TREATMENT  SUBJECTIVE: Pt reports that she is doing well today. No changes since the initial evaluation. No resting pain upon arrival today.   PAIN: Denies   Ther-ex  Supine manually resisted R ankle inversion, eversion, and plantarflexion 2 x 10 each; Supine manually resisted R ankle eccentric dorsiflexion 2 x 10; HEP issued and reviewed with patient including handout and  demonstration;   Manual Therapy  R ankle A/P mobilizations at available end range dorsiflexion, grade I-II, 30s/bout x 3 bouts; R ankle talocrural distraction, grade I-II,  30s/bout x 3 bouts; R ankle subtalar distraction, grade I-II, 30s/bout x 3 bouts; R ankle inversion and eversion passive stretches 30s hold x 2 each; Extensive STM to medial, anterior, and lateral ankle as well as proximally along anterior tibialis muscle;   Trigger Point Dry Needling (TDN), unbilled Education performed with patient regarding potential benefit of TDN. Reviewed precautions and risks with patient. Pt provided verbal consent to treatment. With pt in supine using clean technique TDN performed to R anterior tibialis with 3, 0.25 x 40 single needle placements with local twitch response (LTR) during all placements but most notably during final placement which reproduces referred pain in anterior ankle. Pistoning technique utilized. I   PATIENT EDUCATION:  Education details: Plan of care Person educated: Patient Education method: Explanation Education comprehension: verbalized understanding   HOME EXERCISE PROGRAM:  Access Code: [email or web address] URL: [email or web address] Date: 02/05/2023 Prepared by: Ria Comment  Exercises - Gastroc Stretch on Wall (Mirrored)  - 2 x daily - 7 x weekly - 3 reps - 30-45s hold - Soleus Stretch on Wall (Mirrored)  - 2 x daily - 7 x weekly - 3 sets - 10 reps - Seated Figure 4 Ankle Inversion with Resistance  - 1 x daily - 7 x weekly - 2 sets - 10 reps - 2-3s hold - Seated Ankle Eversion with Resistance (Mirrored)  - 1 x daily - 7 x weekly - 2 sets - 10 reps - 2-3 hold   ASSESSMENT:  CLINICAL IMPRESSION: Initiated manual techniques and strengthening during session today. Also introduced trigger point dry needling to anterior tibialis and during the third placement it reproduces some of the patient's anterior R ankle pain. Issued HEP and pt encouraged to follow-up  as scheduled. Pt will benefit from PT services to address deficits in strength, range of motion, and pain in order to return to full function at home, work, and with leisure activities.  OBJECTIVE IMPAIRMENTS: difficulty walking, decreased strength, and pain.   ACTIVITY LIMITATIONS: standing and stairs  PARTICIPATION LIMITATIONS: shopping, community activity, occupation, and exercise  PERSONAL FACTORS: Past/current experiences and 1-2 comorbidities: anxiety and R ankle arthritis  are also affecting patient's functional outcome.   REHAB POTENTIAL: Good  CLINICAL DECISION MAKING: Evolving/moderate complexity  EVALUATION COMPLEXITY: Moderate   GOALS: Goals reviewed with patient? No  SHORT TERM GOALS: Target date: 03/01/2023  Pt will be independent with HEP to improve strength and decrease ankle pain to improve pain-free function at home and work. Baseline:  Goal status: INITIAL   LONG TERM GOALS: Target date: 03/29/2023  Pt will increase FOTO to at least 67 to demonstrate significant improvement in function at home and work related to ankle pain  Baseline: 02/01/23: 47; Goal status: INITIAL  2.  Pt will decrease worst ankle pain by at least 3 points on the NPRS in order to demonstrate clinically significant reduction in ankle pain. Baseline: 02/01/23: worst: 8/10; Goal status: INITIAL  3.  Pt will decrease LEFS score by at least 9 points in order demonstrate clinically significant reduction in ankle pain/disability.       Baseline: 02/01/23: To be completed Goal status: INITIAL  4.  Pt will increase pain-free strength of R ankle dorsiflexion to 5/5 MMT grade in order to demonstrate improvement in strength and function  Baseline: 02/01/23: 4/5, painful Goal status: INITIAL   PLAN: PT FREQUENCY: 1-2x/week  PT DURATION: 8 weeks  PLANNED INTERVENTIONS: Therapeutic exercises, Therapeutic activity, Neuromuscular re-education, Balance training, Gait training, Patient/Family  education, Self Care, Joint  mobilization, Joint manipulation, Vestibular training, Canalith repositioning, Orthotic/Fit training, DME instructions, Dry Needling, Electrical stimulation, Spinal manipulation, Spinal mobilization, Cryotherapy, Moist heat, Taping, Traction, Ultrasound, Ionotophoresis 4mg /ml Dexamethasone, Manual therapy, and Re-evaluation.  PLAN FOR NEXT SESSION: Progress strengthening and manual techniques, assess response to TDN, modify/review HEP as needed;   Sharalyn Ink Eilidh Marcano PT, DPT, GCS  Messi Twedt, PT 02/05/2023, 2:45 PM

## 2023-02-05 NOTE — Therapy (Unsigned)
OUTPATIENT PHYSICAL THERAPY ANKLE/FOOT TREATMENT  Patient Name: [name] MRN: [id number] DOB:1981/05/02, 42 y.o., female Today's Date: 02/09/2023  END OF SESSION:  PT End of Session - 02/08/23 1734     Visit Number 3    Number of Visits 17    Date for PT Re-Evaluation 03/29/23    Authorization Type eval: 02/01/23    PT Start Time 1705    PT Stop Time 1750    PT Time Calculation (min) 45 min    Activity Tolerance Patient tolerated treatment well    Behavior During Therapy [facility] for tasks assessed/performed            Past Medical History:  Diagnosis Date   Anxiety    Arthritis    right ankle   Bony exostosis 09/2014   right knee   GERD (gastroesophageal reflux disease)    History of migraine    Hypertension    Neuromuscular disorder (HCC)    trigeminal nerve pain   Pain, eye, left 10/10/2014   with tingling - states possible shingles; to see PCP   Painful orthopaedic hardware (HCC) 09/2014   right ankle   Past Surgical History:  Procedure Laterality Date   BONE EXOSTOSIS EXCISION Right 10/16/2014   Procedure: EXCISION OF BONY EXOSTOSIS FROM RIGHT KNEE;  Surgeon: Tarry Kos, MD;  Location: [facility];  Service: Orthopedics;  Laterality: Right;   CHOLECYSTECTOMY  07/12/2006   DILATATION & CURRETTAGE/HYSTEROSCOPY WITH RESECTOCOPE N/A 09/24/2022   Procedure: DILATATION & CURETTAGE/HYSTEROSCOPY WITH RESECTOCOPE;  Surgeon: Maxie Better, MD;  Location: [facility];  Service: Gynecology;  Laterality: N/A;   ENDOMETRIAL ABLATION N/A 09/24/2022   Procedure: ENDOMETRIAL ABLATION;  Surgeon: Maxie Better, MD;  Location: [facility];  Service: Gynecology;  Laterality: N/A;   HARDWARE REMOVAL Right 10/16/2014   Procedure: HARDWARE REMOVAL RIGHT ANKLE;  Surgeon: Tarry Kos, MD;  Location: [facility];  Service: Orthopedics;  Laterality: Right;   KNEE ARTHROSCOPY Right 09/01/2007   ORIF ANKLE FRACTURE  BIMALLEOLAR Right 02/28/2007   ORIF TIBIA FRACTURE Right 02/28/2007   rod placement right   TIBIA HARDWARE REMOVAL Right 09/01/2007   There are no problems to display for this patient.   PCP: Jerl Mina MD  REFERRING PROVIDER: Gershon Mussel MD  REFERRING DIAG: [phone] (ICD-10-CM) - Pain in right ankle and joints of right foot   Rationale for Evaluation and Treatment: Rehabilitation  THERAPY DIAG: Pain in right ankle and joints of right foot  ONSET DATE: Early 2024  FOLLOW-UP APPT SCHEDULED WITH REFERRING PROVIDER: No   From Initial Evaluation SUBJECTIVE:  SUBJECTIVE STATEMENT:  R ankle/foot pain  PERTINENT HISTORY:  Patient is a pleasant 42 year old female who was referred to physical therapy for right ankle pain. She underwent R ankle ORIF after "shattering my ankle" (trimalleolar fracture) in 2008 when she fell down stairs. She is now status post right tibial nail with subsequent hardware removal (lateral plate for fibula fracture removed) back in 2016. She still has the tibial IM nail in place. Pt was doing well until earlier this year when her R foot and ankle started to hurt. She denies any acute injury and states that the only thing she can think might have contributed to her pain was a change in her footwear. She went to [facility] where plain film radiographs showed no acute changes. She was given a home exercise program which did help however pain still remains so she was seen by Dr. Roda Shutters in [place] who referred her for physical therapy. She is now having intermittent discomfort to the R anterior/anteromedial ankle as well as pain in the distal lateral foot (ankle pain started first followed by foot pain at later date). She has pain most days and symptoms are worse with walking as well as at the  end of the day. She notes occasional anterior/anterolateral swelling in the ankle.    R ankle XR (03/01/2007) IMPRESSION:   Spiral type fracture involving the distal fibular shaft above the ankle mortise. There are also two fractures involving the tibia, one is in the mid-tibial shaft region and the other is distally.   XR Ankle Complete Right (01/05/2023) No acute fracture noted.  She does have mild degenerative changes throughout the ankle in addition to a healed fibula fracture.  No hardware complication of the tibial nail.  PAIN:    Pain Intensity: Present: 0/10, Best: 0/10, Worst: 8/10 Pain location: Anterior and anteromedial ankle "deep soreness", R foot pain over the distal 4th and 5th metatarsals (dull ache).  Pain Quality: dull and sore Radiating: No  Swelling: Yes, anterior/anteromedial ankle; Popping, catching, locking: Yes, occasional "pop" which offers pain relief; Numbness/Tingling: No Focal Weakness: Yes, some R ankle weakness reported Aggravating factors: extended standing/walking, exercising, painful walking after first waking in the morning; Relieving factors: pain medicine, rest (offloading), ice, compression stockings at night and sometimes during the day, no benefit with NSAIDs 24-hour pain behavior: Varies throughout the day. Bad especially first thing in the morning History of prior back, hip, knee, or ankle injury, pain, surgery, or therapy: Yes, history of R ankle surgery. R knee pain and swelling since her fall in 2008. She has also been having R low back pain since her injury. Her back pain occasionally radiates into the R hip but never into the knee or ankle;  Dominant hand: right Imaging: Yes, see history Typical footwear: pt has been wearing sneakers since her R foot/ankle pain started Red flags: Positive for weight gain, Negative for personal history of cancer, chills/fever, night sweats, nausea, vomiting;  PRECAUTIONS: None  WEIGHT BEARING RESTRICTIONS:  No  FALLS: Has patient fallen in last 6 months? No  Living Environment Lives with: lives with their daughter Lives in: House/apartment (two level townhome) Stairs: bedroom upstairs and pt has to occasionally go one step at a time due to the pain;  Prior level of function: Independent  Occupational demands: Gaffer at [employer]  Hobbies: exercising, eating different foods, spending time with friends  Patient Goals: Pt would like to be able to exercise and walk her dog without pain, go  for a walk with her dog without pain    OBJECTIVE:   Patient Surveys  LEFS To be completed FOTO 47, predicted improvement to 22  Cognition Patient is oriented to person, place, and time.  Recent memory is intact.  Remote memory is intact.  Attention span and concentration are intact.  Expressive speech is intact.  Patient's fund of knowledge is within normal limits for educational level.    Gross Musculoskeletal Assessment Bulk: Normal Tone: Normal No trophic changes noted to foot/ankle. No ecchymosis, erythema, or edema noted. No gross ankle/foot deformity noted  GAIT: Full gait assessment deferred  Posture: No gross deficits in seated or standing posture which would contribute to her pain;  AROM AROM (Normal range in degrees) AROM   Hip Right Left  Flexion (125)    Extension (15)    Abduction (40)    Adduction     Internal Rotation (45)    External Rotation (45)        Knee    Flexion (135) WNL WNL  Extension (0) WNL WNL      Ankle    Dorsiflexion (20) 12 28  Plantarflexion (50) 50 42  Inversion (35) 18 30  Eversion (15) 8 18  (* = pain; Blank rows = not tested)   LE MMT: MMT (out of 5) Right  Left   Hip flexion 5 5  Hip extension    Hip abduction    Hip adduction    Hip internal rotation    Hip external rotation    Knee flexion 5 5  Knee extension 5 5  Ankle dorsiflexion 4* 5  Ankle plantarflexion 5 5  Ankle inversion 5 5  Ankle  eversion 5 5  (* = pain; Blank rows = not tested)  Sensation Grossly intact to light touch throughout bilateral LEs as determined by testing dermatomes L2-S2. Proprioception, stereognosis, and hot/cold testing deferred on this date.  Reflexes Deferred  Muscle Length Deferred  Palpation Pain with palpation to anteromedial ankle near the scar from her surgery. Pain with palpation over tibialis anterior distal muscle belly and tendon. Pain to posterior tibialis tendon but no pain at navicular. Painful at base of 5th metatarsal and in space between 3rd and 4th/4th and 5th metatarsals.  Passive Accessory Motion Deferred  VASCULAR Deferred  SPECIAL TESTS Ligamentous Integrity Anterior Drawer (ATF, 10-15 plantarflexion with anterior translation): Negative Talar Tilt (CFL, inversion): Negative Eversion Stress Test (Deltoid, eversion): Negative External Rotation Test (High ankle, dorsiflexion and external rotation): Negative Squeeze Test (High ankle): Negative Impingment Sign (Dorsiflexion and eversion): Negative  Achilles Integrity Thompson Test: Negative  Fracture Screening Metatarsal Axial Loading: Negative Tap/Percussion Test: Not examined Vibration Test: Not examined  Pronation/Supination Navicular Drop: Not examined  Nerve Test Tarsal Tunnel Test (maximal DF, EV, toe ext with tapping over tarsal tunnel): Not examined Test for Morton's Neuroma (compress metatarsals and mobilize): Positive  Other Windlass Mechanism Test: Negative   Beighton Scale Deferred   TODAY'S TREATMENT   SUBJECTIVE: Pt reports that she is having a lot of foot and ankle pain today. She rates her pain as 8/10 currently. She was on her feet all day yesterday and had a lot of swelling last night.    PAIN: 8/10 R ankle pain;   Ther-ex  Supine manually resisted R ankle dorsiflexion, inversion, eversion, and plantarflexion 2 x 10 each; HEP modified and reviewed with patient including handout  and demonstration;   Manual Therapy  R ankle A/P mobilizations at available end range  dorsiflexion, grade I-II, 30s/bout x 3 bouts; R ankle talocrural distraction, grade I-II, 30s/bout x 3 bouts; R ankle subtalar distraction, grade I-II, 30s/bout x 3 bouts; R ankle inversion and eversion passive stretches 30s hold x 2 each; Extensive STM to medial, anterior, and lateral ankle as well as proximally along anterior tibialis muscle;   Trigger Point Dry Needling (TDN), unbilled Education performed with patient regarding potential benefit of TDN. Reviewed precautions and risks with patient. Pt provided verbal consent to treatment. With pt in supine using clean technique TDN performed to R anterior tibialis with 3, 0.25 x 40 single needle placements with local twitch response (LTR) during all placements. Also performed one placement to posterior tibialis muscle with 1, 0.25 x 40 single needle placement. Pistoning technique utilized for all placements.   PATIENT EDUCATION:  Education details: Pt educated throughout session about proper posture and technique with exercises. Improved exercise technique, movement at target joints, use of target muscles after min to mod verbal, visual, tactile cues.  Person educated: Patient Education method: Explanation Education comprehension: verbalized understanding   HOME EXERCISE PROGRAM:  Access Code: [email or web address] URL: [email or web address] Date: 02/05/2023 Prepared by: Ria Comment  Exercises - Gastroc Stretch on Wall (Mirrored)  - 2 x daily - 7 x weekly - 3 reps - 30-45s hold - Soleus Stretch on Wall (Mirrored)  - 2 x daily - 7 x weekly - 3 sets - 10 reps - Seated Figure 4 Ankle Inversion with Resistance  - 1 x daily - 7 x weekly - 2 sets - 10 reps - 2-3s hold - Seated Ankle Eversion with Resistance (Mirrored)  - 1 x daily - 7 x weekly - 2 sets - 10 reps - 2-3 hold   ASSESSMENT:  CLINICAL IMPRESSION: Progressed manual techniques and  strengthening during session today. Also repeated trigger point dry needling to anterior tibialis and added one placement to posterior tibialis muscle. Modified HEP and pt encouraged to follow-up as scheduled. Discussed contacting podiatrist to schedule an evaluation. Pt will benefit from PT services to address deficits in strength, range of motion, and pain in order to return to full function at home, work, and with leisure activities.  OBJECTIVE IMPAIRMENTS: difficulty walking, decreased strength, and pain.   ACTIVITY LIMITATIONS: standing and stairs  PARTICIPATION LIMITATIONS: shopping, community activity, occupation, and exercise  PERSONAL FACTORS: Past/current experiences and 1-2 comorbidities: anxiety and R ankle arthritis  are also affecting patient's functional outcome.   REHAB POTENTIAL: Good  CLINICAL DECISION MAKING: Evolving/moderate complexity  EVALUATION COMPLEXITY: Moderate   GOALS: Goals reviewed with patient? No  SHORT TERM GOALS: Target date: 03/01/2023  Pt will be independent with HEP to improve strength and decrease ankle pain to improve pain-free function at home and work. Baseline:  Goal status: INITIAL   LONG TERM GOALS: Target date: 03/29/2023  Pt will increase FOTO to at least 67 to demonstrate significant improvement in function at home and work related to ankle pain  Baseline: 02/01/23: 47; Goal status: INITIAL  2.  Pt will decrease worst ankle pain by at least 3 points on the NPRS in order to demonstrate clinically significant reduction in ankle pain. Baseline: 02/01/23: worst: 8/10; Goal status: INITIAL  3.  Pt will decrease LEFS score by at least 9 points in order demonstrate clinically significant reduction in ankle pain/disability.       Baseline: 02/01/23: To be completed Goal status: INITIAL  4.  Pt will increase pain-free strength of R ankle dorsiflexion to 5/5 MMT grade in  order to demonstrate improvement in strength and function  Baseline:  02/01/23: 4/5, painful Goal status: INITIAL   PLAN: PT FREQUENCY: 1-2x/week  PT DURATION: 8 weeks  PLANNED INTERVENTIONS: Therapeutic exercises, Therapeutic activity, Neuromuscular re-education, Balance training, Gait training, Patient/Family education, Self Care, Joint mobilization, Joint manipulation, Vestibular training, Canalith repositioning, Orthotic/Fit training, DME instructions, Dry Needling, Electrical stimulation, Spinal manipulation, Spinal mobilization, Cryotherapy, Moist heat, Taping, Traction, Ultrasound, Ionotophoresis 4mg /ml Dexamethasone, Manual therapy, and Re-evaluation.  PLAN FOR NEXT SESSION: Progress strengthening and manual techniques, assess response to TDN, modify/review HEP as needed;   Lynnea Maizes PT, DPT, GCS  [name], PT 02/09/2023, 4:34 PM

## 2023-02-08 ENCOUNTER — Ambulatory Visit: Payer: BC Managed Care – PPO

## 2023-02-08 DIAGNOSIS — M25571 Pain in right ankle and joints of right foot: Secondary | ICD-10-CM

## 2023-02-10 ENCOUNTER — Ambulatory Visit: Payer: BC Managed Care – PPO | Attending: Orthopaedic Surgery

## 2023-02-10 DIAGNOSIS — M25571 Pain in right ankle and joints of right foot: Secondary | ICD-10-CM | POA: Diagnosis present

## 2023-02-10 DIAGNOSIS — M6281 Muscle weakness (generalized): Secondary | ICD-10-CM | POA: Insufficient documentation

## 2023-02-11 NOTE — Therapy (Signed)
OUTPATIENT PHYSICAL THERAPY ANKLE/FOOT TREATMENT  Patient Name: [name] MRN: [id number] DOB:1980-12-10, 42 y.o., female Today's Date: 02/11/2023  END OF SESSION:  PT End of Session - 02/11/23 1148     Visit Number 4    Number of Visits 17    Date for PT Re-Evaluation 03/29/23    Authorization Type eval: 02/01/23    PT Start Time 1710    PT Stop Time 1755    PT Time Calculation (min) 45 min    Activity Tolerance Patient tolerated treatment well    Behavior During Therapy [facility] for tasks assessed/performed            Past Medical History:  Diagnosis Date   Anxiety    Arthritis    right ankle   Bony exostosis 09/2014   right knee   GERD (gastroesophageal reflux disease)    History of migraine    Hypertension    Neuromuscular disorder (HCC)    trigeminal nerve pain   Pain, eye, left 10/10/2014   with tingling - states possible shingles; to see PCP   Painful orthopaedic hardware (HCC) 09/2014   right ankle   Past Surgical History:  Procedure Laterality Date   BONE EXOSTOSIS EXCISION Right 10/16/2014   Procedure: EXCISION OF BONY EXOSTOSIS FROM RIGHT KNEE;  Surgeon: Tarry Kos, MD;  Location: [facility];  Service: Orthopedics;  Laterality: Right;   CHOLECYSTECTOMY  07/12/2006   DILATATION & CURRETTAGE/HYSTEROSCOPY WITH RESECTOCOPE N/A 09/24/2022   Procedure: DILATATION & CURETTAGE/HYSTEROSCOPY WITH RESECTOCOPE;  Surgeon: Maxie Better, MD;  Location: [facility];  Service: Gynecology;  Laterality: N/A;   ENDOMETRIAL ABLATION N/A 09/24/2022   Procedure: ENDOMETRIAL ABLATION;  Surgeon: Maxie Better, MD;  Location: [facility];  Service: Gynecology;  Laterality: N/A;   HARDWARE REMOVAL Right 10/16/2014   Procedure: HARDWARE REMOVAL RIGHT ANKLE;  Surgeon: Tarry Kos, MD;  Location:  SURGERY CENTER;  Service: Orthopedics;  Laterality: Right;   KNEE ARTHROSCOPY Right 09/01/2007   ORIF ANKLE FRACTURE  BIMALLEOLAR Right 02/28/2007   ORIF TIBIA FRACTURE Right 02/28/2007   rod placement right   TIBIA HARDWARE REMOVAL Right 09/01/2007   There are no problems to display for this patient.   PCP: Jerl Mina MD  REFERRING PROVIDER: Gershon Mussel MD  REFERRING DIAG: [phone] (ICD-10-CM) - Pain in right ankle and joints of right foot   Rationale for Evaluation and Treatment: Rehabilitation  THERAPY DIAG: Pain in right ankle and joints of right foot  ONSET DATE: Early 2024  FOLLOW-UP APPT SCHEDULED WITH REFERRING PROVIDER: No   From Initial Evaluation SUBJECTIVE:  SUBJECTIVE STATEMENT:  R ankle/foot pain  PERTINENT HISTORY:  Patient is a pleasant 42 year old female who was referred to physical therapy for right ankle pain. She underwent R ankle ORIF after "shattering my ankle" (trimalleolar fracture) in 2008 when she fell down stairs. She is now status post right tibial nail with subsequent hardware removal (lateral plate for fibula fracture removed) back in 2016. She still has the tibial IM nail in place. Pt was doing well until earlier this year when her R foot and ankle started to hurt. She denies any acute injury and states that the only thing she can think might have contributed to her pain was a change in her footwear. She went to [facility] where plain film radiographs showed no acute changes. She was given a home exercise program which did help however pain still remains so she was seen by Dr. Roda Shutters in [place] who referred her for physical therapy. She is now having intermittent discomfort to the R anterior/anteromedial ankle as well as pain in the distal lateral foot (ankle pain started first followed by foot pain at later date). She has pain most days and symptoms are worse with walking as well as at the  end of the day. She notes occasional anterior/anterolateral swelling in the ankle.    R ankle XR (03/01/2007) IMPRESSION:   Spiral type fracture involving the distal fibular shaft above the ankle mortise. There are also two fractures involving the tibia, one is in the mid-tibial shaft region and the other is distally.   XR Ankle Complete Right (01/05/2023) No acute fracture noted.  She does have mild degenerative changes throughout the ankle in addition to a healed fibula fracture.  No hardware complication of the tibial nail.  PAIN:    Pain Intensity: Present: 0/10, Best: 0/10, Worst: 8/10 Pain location: Anterior and anteromedial ankle "deep soreness", R foot pain over the distal 4th and 5th metatarsals (dull ache).  Pain Quality: dull and sore Radiating: No  Swelling: Yes, anterior/anteromedial ankle; Popping, catching, locking: Yes, occasional "pop" which offers pain relief; Numbness/Tingling: No Focal Weakness: Yes, some R ankle weakness reported Aggravating factors: extended standing/walking, exercising, painful walking after first waking in the morning; Relieving factors: pain medicine, rest (offloading), ice, compression stockings at night and sometimes during the day, no benefit with NSAIDs 24-hour pain behavior: Varies throughout the day. Bad especially first thing in the morning History of prior back, hip, knee, or ankle injury, pain, surgery, or therapy: Yes, history of R ankle surgery. R knee pain and swelling since her fall in 2008. She has also been having R low back pain since her injury. Her back pain occasionally radiates into the R hip but never into the knee or ankle;  Dominant hand: right Imaging: Yes, see history Typical footwear: pt has been wearing sneakers since her R foot/ankle pain started Red flags: Positive for weight gain, Negative for personal history of cancer, chills/fever, night sweats, nausea, vomiting;  PRECAUTIONS: None  WEIGHT BEARING RESTRICTIONS:  No  FALLS: Has patient fallen in last 6 months? No  Living Environment Lives with: lives with their daughter Lives in: House/apartment (two level townhome) Stairs: bedroom upstairs and pt has to occasionally go one step at a time due to the pain;  Prior level of function: Independent  Occupational demands: Gaffer at [employer]  Hobbies: exercising, eating different foods, spending time with friends  Patient Goals: Pt would like to be able to exercise and walk her dog without pain, go  for a walk with her dog without pain    OBJECTIVE:   Patient Surveys  LEFS To be completed FOTO 47, predicted improvement to 42  Cognition Patient is oriented to person, place, and time.  Recent memory is intact.  Remote memory is intact.  Attention span and concentration are intact.  Expressive speech is intact.  Patient's fund of knowledge is within normal limits for educational level.    Gross Musculoskeletal Assessment Bulk: Normal Tone: Normal No trophic changes noted to foot/ankle. No ecchymosis, erythema, or edema noted. No gross ankle/foot deformity noted  GAIT: Full gait assessment deferred  Posture: No gross deficits in seated or standing posture which would contribute to her pain;  AROM AROM (Normal range in degrees) AROM   Hip Right Left  Flexion (125)    Extension (15)    Abduction (40)    Adduction     Internal Rotation (45)    External Rotation (45)        Knee    Flexion (135) WNL WNL  Extension (0) WNL WNL      Ankle    Dorsiflexion (20) 12 28  Plantarflexion (50) 50 42  Inversion (35) 18 30  Eversion (15) 8 18  (* = pain; Blank rows = not tested)   LE MMT: MMT (out of 5) Right  Left   Hip flexion 5 5  Hip extension    Hip abduction    Hip adduction    Hip internal rotation    Hip external rotation    Knee flexion 5 5  Knee extension 5 5  Ankle dorsiflexion 4* 5  Ankle plantarflexion 5 5  Ankle inversion 5 5  Ankle  eversion 5 5  (* = pain; Blank rows = not tested)  Sensation Grossly intact to light touch throughout bilateral LEs as determined by testing dermatomes L2-S2. Proprioception, stereognosis, and hot/cold testing deferred on this date.  Reflexes Deferred  Muscle Length Deferred  Palpation Pain with palpation to anteromedial ankle near the scar from her surgery. Pain with palpation over tibialis anterior distal muscle belly and tendon. Pain to posterior tibialis tendon but no pain at navicular. Painful at base of 5th metatarsal and in space between 3rd and 4th/4th and 5th metatarsals.  Passive Accessory Motion Deferred  VASCULAR Deferred  SPECIAL TESTS Ligamentous Integrity Anterior Drawer (ATF, 10-15 plantarflexion with anterior translation): Negative Talar Tilt (CFL, inversion): Negative Eversion Stress Test (Deltoid, eversion): Negative External Rotation Test (High ankle, dorsiflexion and external rotation): Negative Squeeze Test (High ankle): Negative Impingment Sign (Dorsiflexion and eversion): Negative  Achilles Integrity Thompson Test: Negative  Fracture Screening Metatarsal Axial Loading: Negative Tap/Percussion Test: Not examined Vibration Test: Not examined  Pronation/Supination Navicular Drop: Not examined  Nerve Test Tarsal Tunnel Test (maximal DF, EV, toe ext with tapping over tarsal tunnel): Not examined Test for Morton's Neuroma (compress metatarsals and mobilize): Positive  Other Windlass Mechanism Test: Negative   Beighton Scale Deferred   TODAY'S TREATMENT  SUBJECTIVE: Patient reported pain in ankle at 4/10. Stated "slightly sore" consistent with post dry needling session. No further questions or concerns.    PAIN: 4/10 R ankle pain;   Ther-ex  Supine manually resisted R ankle dorsiflexion, inversion, and plantarflexion 3 x 10 each; Supine manually resisted R ankle eversion isometric 3 x 10 x 10 sec holds;  Towel Scrunches x 4 min;  HEP  modified and reviewed with patient including demonstration;  Manual Therapy  R ankle A/P mobilizations at available end range  dorsiflexion, grade I-II, 30s/bout x 3 bouts; R ankle talocrural distraction, grade I-II, 30s/bout x 3 bouts; R ankle subtalar distraction, grade I-II, 30s/bout x 3 bouts; Extensive STM to medial, anterior, and lateral ankle as well as proximally along anterior tibialis muscle;  Not performed R ankle inversion and eversion passive stretches 30s hold x 2 each;  PATIENT EDUCATION:  Education details: Pt educated throughout session about proper posture and technique with exercises. Educated patient on self STM at Tibialis anterior for pain and stiffness Improved exercise technique, movement at target joints, use of target muscles after min to mod verbal, visual, tactile cues.  Person educated: Patient Education method: Medical illustrator Education comprehension: verbalized and demonstrated understanding   HOME EXERCISE PROGRAM:  Access Code: [email or web address] URL: [email or web address] Date: 02/05/2023 Prepared by: Ria Comment  Exercises - Gastroc Stretch on Wall (Mirrored)  - 2 x daily - 7 x weekly - 3 reps - 30-45s hold - Soleus Stretch on Wall (Mirrored)  - 2 x daily - 7 x weekly - 3 sets - 10 reps - Seated Figure 4 Ankle Inversion with Resistance  - 1 x daily - 7 x weekly - 2 sets - 10 reps - 2-3s hold - Seated Ankle Eversion with Resistance (Mirrored)  - 1 x daily - 7 x weekly - 2 sets - 10 reps - 2-3 hold   ASSESSMENT:  CLINICAL IMPRESSION: Progressed manual techniques and strengthening during session today. Deferred dry needling this session due to reported soreness in RLE. Educated patient on self STM on tibialis anterior with good return demonstration. Updated HEP to include towel scrunches. Continue to plan on progressing LE strength and mobility in order to reduce pain. Pt provided names of local podiatrists and encouraged to call  and schedule appointment. Patient will continue benefit from PT services to address deficits in strength, range of motion, and pain in order to return to full function at home, work, and with leisure activities.  OBJECTIVE IMPAIRMENTS: difficulty walking, decreased strength, and pain.   ACTIVITY LIMITATIONS: standing and stairs  PARTICIPATION LIMITATIONS: shopping, community activity, occupation, and exercise  PERSONAL FACTORS: Past/current experiences and 1-2 comorbidities: anxiety and R ankle arthritis  are also affecting patient's functional outcome.   REHAB POTENTIAL: Good  CLINICAL DECISION MAKING: Evolving/moderate complexity  EVALUATION COMPLEXITY: Moderate   GOALS: Goals reviewed with patient? No  SHORT TERM GOALS: Target date: 03/01/2023  Pt will be independent with HEP to improve strength and decrease ankle pain to improve pain-free function at home and work. Baseline:  Goal status: INITIAL   LONG TERM GOALS: Target date: 03/29/2023  Pt will increase FOTO to at least 67 to demonstrate significant improvement in function at home and work related to ankle pain  Baseline: 02/01/23: 47; Goal status: INITIAL  2.  Pt will decrease worst ankle pain by at least 3 points on the NPRS in order to demonstrate clinically significant reduction in ankle pain. Baseline: 02/01/23: worst: 8/10; Goal status: INITIAL  3.  Pt will decrease LEFS score by at least 9 points in order demonstrate clinically significant reduction in ankle pain/disability.       Baseline: 02/01/23: To be completed Goal status: INITIAL  4.  Pt will increase pain-free strength of R ankle dorsiflexion to 5/5 MMT grade in order to demonstrate improvement in strength and function  Baseline: 02/01/23: 4/5, painful Goal status: INITIAL   PLAN: PT FREQUENCY: 1-2x/week  PT DURATION: 8 weeks  PLANNED INTERVENTIONS: Therapeutic exercises, Therapeutic activity, Neuromuscular re-education, Balance training,  Gait  training, Patient/Family education, Self Care, Joint mobilization, Joint manipulation, Vestibular training, Canalith repositioning, Orthotic/Fit training, DME instructions, Dry Needling, Electrical stimulation, Spinal manipulation, Spinal mobilization, Cryotherapy, Moist heat, Taping, Traction, Ultrasound, Ionotophoresis 4mg /ml Dexamethasone, Manual therapy, and Re-evaluation.  PLAN FOR NEXT SESSION: Progress strengthening and manual techniques, assess response to TDN, modify/review HEP as needed;  Cristal Deer Go SPT Sharalyn Ink Serrena Linderman PT, DPT, GCS  Normand Damron, PT 02/11/2023, 12:03 PM

## 2023-02-15 ENCOUNTER — Ambulatory Visit: Payer: BC Managed Care – PPO

## 2023-02-15 DIAGNOSIS — M25571 Pain in right ankle and joints of right foot: Secondary | ICD-10-CM | POA: Diagnosis not present

## 2023-02-15 NOTE — Therapy (Cosign Needed)
OUTPATIENT PHYSICAL THERAPY ANKLE/FOOT TREATMENT  Patient Name: [name] MRN: [id number] DOB:1981-03-20, 42 y.o., female Today's Date: 02/16/2023  END OF SESSION:  PT End of Session - 02/15/23 1751     Visit Number 5    PT Start Time 1700    PT Stop Time 1748    PT Time Calculation (min) 48 min    Activity Tolerance Patient tolerated treatment well    Behavior During Therapy [facility] for tasks assessed/performed            Past Medical History:  Diagnosis Date   Anxiety    Arthritis    right ankle   Bony exostosis 09/2014   right knee   GERD (gastroesophageal reflux disease)    History of migraine    Hypertension    Neuromuscular disorder (HCC)    trigeminal nerve pain   Pain, eye, left 10/10/2014   with tingling - states possible shingles; to see PCP   Painful orthopaedic hardware (HCC) 09/2014   right ankle   Past Surgical History:  Procedure Laterality Date   BONE EXOSTOSIS EXCISION Right 10/16/2014   Procedure: EXCISION OF BONY EXOSTOSIS FROM RIGHT KNEE;  Surgeon: Tarry Kos, MD;  Location: [facility];  Service: Orthopedics;  Laterality: Right;   CHOLECYSTECTOMY  07/12/2006   DILATATION & CURRETTAGE/HYSTEROSCOPY WITH RESECTOCOPE N/A 09/24/2022   Procedure: DILATATION & CURETTAGE/HYSTEROSCOPY WITH RESECTOCOPE;  Surgeon: Maxie Better, MD;  Location: [facility] [place];  Service: Gynecology;  Laterality: N/A;   ENDOMETRIAL ABLATION N/A 09/24/2022   Procedure: ENDOMETRIAL ABLATION;  Surgeon: Maxie Better, MD;  Location: [facility];  Service: Gynecology;  Laterality: N/A;   HARDWARE REMOVAL Right 10/16/2014   Procedure: HARDWARE REMOVAL RIGHT ANKLE;  Surgeon: Tarry Kos, MD;  Location: [facility];  Service: Orthopedics;  Laterality: Right;   KNEE ARTHROSCOPY Right 09/01/2007   ORIF ANKLE FRACTURE BIMALLEOLAR Right 02/28/2007   ORIF TIBIA FRACTURE Right 02/28/2007   rod placement right   TIBIA  HARDWARE REMOVAL Right 09/01/2007   There are no problems to display for this patient.   PCP: Jerl Mina MD  REFERRING PROVIDER: Gershon Mussel MD  REFERRING DIAG: [phone] (ICD-10-CM) - Pain in right ankle and joints of right foot   Rationale for Evaluation and Treatment: Rehabilitation  THERAPY DIAG: Pain in right ankle and joints of right foot  ONSET DATE: Early 2024  FOLLOW-UP APPT SCHEDULED WITH REFERRING PROVIDER: No   From Initial Evaluation SUBJECTIVE:  SUBJECTIVE STATEMENT:  R ankle/foot pain  PERTINENT HISTORY:  Patient is a pleasant 42 year old female who was referred to physical therapy for right ankle pain. She underwent R ankle ORIF after "shattering my ankle" (trimalleolar fracture) in 2008 when she fell down stairs. She is now status post right tibial nail with subsequent hardware removal (lateral plate for fibula fracture removed) back in 2016. She still has the tibial IM nail in place. Pt was doing well until earlier this year when her R foot and ankle started to hurt. She denies any acute injury and states that the only thing she can think might have contributed to her pain was a change in her footwear. She went to [facility] where plain film radiographs showed no acute changes. She was given a home exercise program which did help however pain still remains so she was seen by Dr. Roda Shutters in [place] who referred her for physical therapy. She is now having intermittent discomfort to the R anterior/anteromedial ankle as well as pain in the distal lateral foot (ankle pain started first followed by foot pain at later date). She has pain most days and symptoms are worse with walking as well as at the end of the day. She notes occasional anterior/anterolateral swelling in the ankle.    R ankle XR  (03/01/2007) IMPRESSION:   Spiral type fracture involving the distal fibular shaft above the ankle mortise. There are also two fractures involving the tibia, one is in the mid-tibial shaft region and the other is distally.   XR Ankle Complete Right (01/05/2023) No acute fracture noted.  She does have mild degenerative changes throughout the ankle in addition to a healed fibula fracture.  No hardware complication of the tibial nail.  PAIN:    Pain Intensity: Present: 0/10, Best: 0/10, Worst: 8/10 Pain location: Anterior and anteromedial ankle "deep soreness", R foot pain over the distal 4th and 5th metatarsals (dull ache).  Pain Quality: dull and sore Radiating: No  Swelling: Yes, anterior/anteromedial ankle; Popping, catching, locking: Yes, occasional "pop" which offers pain relief; Numbness/Tingling: No Focal Weakness: Yes, some R ankle weakness reported Aggravating factors: extended standing/walking, exercising, painful walking after first waking in the morning; Relieving factors: pain medicine, rest (offloading), ice, compression stockings at night and sometimes during the day, no benefit with NSAIDs 24-hour pain behavior: Varies throughout the day. Bad especially first thing in the morning History of prior back, hip, knee, or ankle injury, pain, surgery, or therapy: Yes, history of R ankle surgery. R knee pain and swelling since her fall in 2008. She has also been having R low back pain since her injury. Her back pain occasionally radiates into the R hip but never into the knee or ankle;  Dominant hand: right Imaging: Yes, see history Typical footwear: pt has been wearing sneakers since her R foot/ankle pain started Red flags: Positive for weight gain, Negative for personal history of cancer, chills/fever, night sweats, nausea, vomiting;  PRECAUTIONS: None  WEIGHT BEARING RESTRICTIONS: No  FALLS: Has patient fallen in last 6 months? No  Living Environment Lives with: lives with  their daughter Lives in: House/apartment (two level townhome) Stairs: bedroom upstairs and pt has to occasionally [name] one step at a time due to the pain;  Prior level of function: Independent  Occupational demands: Gaffer at [employer]  Hobbies: exercising, eating different foods, spending time with friends  Patient Goals: Pt would like to be able to exercise and walk her dog without pain, [place] [employer]  for a walk with her dog without pain    OBJECTIVE:   Patient Surveys  LEFS To be completed FOTO 47, predicted improvement to 5  Cognition Patient is oriented to person, place, and time.  Recent memory is intact.  Remote memory is intact.  Attention span and concentration are intact.  Expressive speech is intact.  Patient's fund of knowledge is within normal limits for educational level.    Gross Musculoskeletal Assessment Bulk: Normal Tone: Normal No trophic changes noted to foot/ankle. No ecchymosis, erythema, or edema noted. No gross ankle/foot deformity noted  GAIT: Full gait assessment deferred  Posture: No gross deficits in seated or standing posture which would contribute to her pain;  AROM AROM (Normal range in degrees) AROM   Hip Right Left  Flexion (125)    Extension (15)    Abduction (40)    Adduction     Internal Rotation (45)    External Rotation (45)        Knee    Flexion (135) WNL WNL  Extension (0) WNL WNL      Ankle    Dorsiflexion (20) 12 28  Plantarflexion (50) 50 42  Inversion (35) 18 30  Eversion (15) 8 18  (* = pain; Blank rows = not tested)   LE MMT: MMT (out of 5) Right  Left   Hip flexion 5 5  Hip extension    Hip abduction    Hip adduction    Hip internal rotation    Hip external rotation    Knee flexion 5 5  Knee extension 5 5  Ankle dorsiflexion 4* 5  Ankle plantarflexion 5 5  Ankle inversion 5 5  Ankle eversion 5 5  (* = pain; Blank rows = not tested)  Sensation Grossly intact to light touch  throughout bilateral LEs as determined by testing dermatomes L2-S2. Proprioception, stereognosis, and hot/cold testing deferred on this date.  Reflexes Deferred  Muscle Length Deferred  Palpation Pain with palpation to anteromedial ankle near the scar from her surgery. Pain with palpation over tibialis anterior distal muscle belly and tendon. Pain to posterior tibialis tendon but no pain at navicular. Painful at base of 5th metatarsal and in space between 3rd and 4th/4th and 5th metatarsals.  Passive Accessory Motion Deferred  VASCULAR Deferred  SPECIAL TESTS Ligamentous Integrity Anterior Drawer (ATF, 10-15 plantarflexion with anterior translation): Negative Talar Tilt (CFL, inversion): Negative Eversion Stress Test (Deltoid, eversion): Negative External Rotation Test (High ankle, dorsiflexion and external rotation): Negative Squeeze Test (High ankle): Negative Impingment Sign (Dorsiflexion and eversion): Negative  Achilles Integrity Thompson Test: Negative  Fracture Screening Metatarsal Axial Loading: Negative Tap/Percussion Test: Not examined Vibration Test: Not examined  Pronation/Supination Navicular Drop: Not examined  Nerve Test Tarsal Tunnel Test (maximal DF, EV, toe ext with tapping over tarsal tunnel): Not examined Test for Morton's Neuroma (compress metatarsals and mobilize): Positive  Other Windlass Mechanism Test: Negative   Beighton Scale Deferred   TODAY'S TREATMENT  SUBJECTIVE: Patient reported fall since last session "2:30am I fell halfway down my steps on my right leg; 10/10 pain but it has improved." Pt reports scheduling an appointment with podiatrist 02/25/2023.    PAIN: 0/10 pain at rest   Ther-ex  BLE Calf Raises at Steps 3 x10, no pain reported; BLE Ankle Drop Calf Stretch at Steps 3 x 30s; (Added to HEP)  Ankle Control with Green Balance Pad, Staggered Stance 2 x 30s DF/PF on [employer], no UE support 3 x  10 ea  direction; HEP modified and reviewed with patient including demonstration;   Manual Therapy  R ankle A/P mobilizations at available end range dorsiflexion, grade I-II, 30s/bout x 2 bouts; R ankle talocrural distraction, grade I-II, 30s/bout x 3 bouts; Extensive STM to medial, anterior, and lateral ankle as well as proximally along anterior tibialis muscle;  Not performed R ankle inversion and eversion passive stretches 30s hold x 2 each;  PATIENT EDUCATION:  Education details: Pt educated throughout session about proper posture and technique with exercises. Educated patient on self STM at Tibialis anterior for pain and stiffness Improved exercise technique, movement at target joints, use of target muscles after min to mod verbal, visual, tactile cues.  Person educated: Patient Education method: Medical illustrator Education comprehension: verbalized and demonstrated understanding   HOME EXERCISE PROGRAM:  Access Code: [email or web address] URL: [email or web address] Date: 02/05/2023 Prepared by: Ria Comment  Exercises - Gastrocnemius Stretch on Stairstep - 1 x daily - 7 x weekly - 3 sets - 30s hold  - Seated Figure 4 Ankle Inversion with Resistance  - 1 x daily - 7 x weekly - 2 sets - 10 reps - 2-3s hold - Seated Ankle Eversion with Resistance (Mirrored)  - 1 x daily - 7 x weekly - 2 sets - 10 reps - 2-3 hold   ASSESSMENT:  CLINICAL IMPRESSION: Patient reported to physical therapy highly motivated. Pt reported a fall since last session with slight soreness in right hip but able to tolerate LE stability exercises. Progressed patient to standing stability exercises with no report of additional pain. Updated HEP to include ankle stretch at steps. Plan to continue progressing LE strength and stability training. Patient will continue benefit from PT services to address deficits in strength, range of motion, and pain in order to return to full function at home, work, and with  leisure activities.  OBJECTIVE IMPAIRMENTS: difficulty walking, decreased strength, and pain.   ACTIVITY LIMITATIONS: standing and stairs  PARTICIPATION LIMITATIONS: shopping, community activity, occupation, and exercise  PERSONAL FACTORS: Past/current experiences and 1-2 comorbidities: anxiety and R ankle arthritis  are also affecting patient's functional outcome.   REHAB POTENTIAL: Good  CLINICAL DECISION MAKING: Evolving/moderate complexity  EVALUATION COMPLEXITY: Moderate   GOALS: Goals reviewed with patient? No  SHORT TERM GOALS: Target date: 03/01/2023  Pt will be independent with HEP to improve strength and decrease ankle pain to improve pain-free function at home and work. Baseline:  Goal status: INITIAL   LONG TERM GOALS: Target date: 03/29/2023  Pt will increase FOTO to at least 67 to demonstrate significant improvement in function at home and work related to ankle pain  Baseline: 02/01/23: 47; Goal status: INITIAL  2.  Pt will decrease worst ankle pain by at least 3 points on the NPRS in order to demonstrate clinically significant reduction in ankle pain. Baseline: 02/01/23: worst: 8/10; Goal status: INITIAL  3.  Pt will decrease LEFS score by at least 9 points in order demonstrate clinically significant reduction in ankle pain/disability.       Baseline: 02/01/23: To be completed Goal status: INITIAL  4.  Pt will increase pain-free strength of R ankle dorsiflexion to 5/5 MMT grade in order to demonstrate improvement in strength and function  Baseline: 02/01/23: 4/5, painful Goal status: INITIAL   PLAN: PT FREQUENCY: 1-2x/week  PT DURATION: 8 weeks  PLANNED INTERVENTIONS: Therapeutic exercises, Therapeutic activity, Neuromuscular re-education, Balance training, Gait training, Patient/Family education, Self Care, Joint mobilization, Joint manipulation, Vestibular training, Canalith repositioning, Orthotic/Fit  training, DME instructions, Dry Needling, Electrical  stimulation, Spinal manipulation, Spinal mobilization, Cryotherapy, Moist heat, Taping, Traction, Ultrasound, Ionotophoresis 4mg /ml Dexamethasone, Manual therapy, and Re-evaluation.  PLAN FOR NEXT SESSION: Progress strengthening and manual techniques, assess response to TDN, modify/review HEP as needed;  Cristal Deer Oris Staffieri SPT Sharalyn Ink Huprich PT, DPT, GCS  Huprich,Jason, PT 02/16/2023, 1:33 PM

## 2023-02-17 ENCOUNTER — Ambulatory Visit: Payer: BC Managed Care – PPO

## 2023-02-17 DIAGNOSIS — M6281 Muscle weakness (generalized): Secondary | ICD-10-CM

## 2023-02-17 DIAGNOSIS — M25571 Pain in right ankle and joints of right foot: Secondary | ICD-10-CM | POA: Diagnosis not present

## 2023-02-17 NOTE — Therapy (Cosign Needed Addendum)
OUTPATIENT PHYSICAL THERAPY ANKLE/FOOT TREATMENT  Patient Name: [name] MRN: [id number] DOB:04/02/1981, 42 y.o., female Today's Date: 02/18/2023  END OF SESSION:  PT End of Session - 02/17/23 1736     Visit Number 6    Number of Visits 17    Date for PT Re-Evaluation 03/29/23    Authorization Type eval: 02/01/23    PT Start Time 1650    PT Stop Time 1735    PT Time Calculation (min) 45 min    Activity Tolerance Patient tolerated treatment well    Behavior During Therapy [facility] for tasks assessed/performed            Past Medical History:  Diagnosis Date   Anxiety    Arthritis    right ankle   Bony exostosis 09/2014   right knee   GERD (gastroesophageal reflux disease)    History of migraine    Hypertension    Neuromuscular disorder (HCC)    trigeminal nerve pain   Pain, eye, left 10/10/2014   with tingling - states possible shingles; to see PCP   Painful orthopaedic hardware (HCC) 09/2014   right ankle   Past Surgical History:  Procedure Laterality Date   BONE EXOSTOSIS EXCISION Right 10/16/2014   Procedure: EXCISION OF BONY EXOSTOSIS FROM RIGHT KNEE;  Surgeon: Tarry Kos, MD;  Location: [facility];  Service: Orthopedics;  Laterality: Right;   CHOLECYSTECTOMY  07/12/2006   DILATATION & CURRETTAGE/HYSTEROSCOPY WITH RESECTOCOPE N/A 09/24/2022   Procedure: DILATATION & CURETTAGE/HYSTEROSCOPY WITH RESECTOCOPE;  Surgeon: Maxie Better, MD;  Location: [facility];  Service: Gynecology;  Laterality: N/A;   ENDOMETRIAL ABLATION N/A 09/24/2022   Procedure: ENDOMETRIAL ABLATION;  Surgeon: Maxie Better, MD;  Location: [facility];  Service: Gynecology;  Laterality: N/A;   HARDWARE REMOVAL Right 10/16/2014   Procedure: HARDWARE REMOVAL RIGHT ANKLE;  Surgeon: Tarry Kos, MD;  Location: [facility];  Service: Orthopedics;  Laterality: Right;   KNEE ARTHROSCOPY Right 09/01/2007   ORIF ANKLE FRACTURE  BIMALLEOLAR Right 02/28/2007   ORIF TIBIA FRACTURE Right 02/28/2007   rod placement right   TIBIA HARDWARE REMOVAL Right 09/01/2007   There are no problems to display for this patient.   PCP: Jerl Mina MD  REFERRING PROVIDER: Gershon Mussel MD  REFERRING DIAG: [phone] (ICD-10-CM) - Pain in right ankle and joints of right foot   Rationale for Evaluation and Treatment: Rehabilitation  THERAPY DIAG: Pain in right ankle and joints of right foot  Muscle weakness (generalized)  ONSET DATE: Early 2024  FOLLOW-UP APPT SCHEDULED WITH REFERRING PROVIDER: No   From Initial Evaluation SUBJECTIVE:  SUBJECTIVE STATEMENT:  R ankle/foot pain  PERTINENT HISTORY:  Patient is a pleasant 42 year old female who was referred to physical therapy for right ankle pain. She underwent R ankle ORIF after "shattering my ankle" (trimalleolar fracture) in 2008 when she fell down stairs. She is now status post right tibial nail with subsequent hardware removal (lateral plate for fibula fracture removed) back in 2016. She still has the tibial IM nail in place. Pt was doing well until earlier this year when her R foot and ankle started to hurt. She denies any acute injury and states that the only thing she can think might have contributed to her pain was a change in her footwear. She went to [facility] where plain film radiographs showed no acute changes. She was given a home exercise program which did help however pain still remains so she was seen by Dr. Roda Shutters in [place] who referred her for physical therapy. She is now having intermittent discomfort to the R anterior/anteromedial ankle as well as pain in the distal lateral foot (ankle pain started first followed by foot pain at later date). She has pain most days and symptoms are  worse with walking as well as at the end of the day. She notes occasional anterior/anterolateral swelling in the ankle.    R ankle XR (03/01/2007) IMPRESSION:   Spiral type fracture involving the distal fibular shaft above the ankle mortise. There are also two fractures involving the tibia, one is in the mid-tibial shaft region and the other is distally.   XR Ankle Complete Right (01/05/2023) No acute fracture noted.  She does have mild degenerative changes throughout the ankle in addition to a healed fibula fracture.  No hardware complication of the tibial nail.  PAIN:    Pain Intensity: Present: 0/10, Best: 0/10, Worst: 8/10 Pain location: Anterior and anteromedial ankle "deep soreness", R foot pain over the distal 4th and 5th metatarsals (dull ache).  Pain Quality: dull and sore Radiating: No  Swelling: Yes, anterior/anteromedial ankle; Popping, catching, locking: Yes, occasional "pop" which offers pain relief; Numbness/Tingling: No Focal Weakness: Yes, some R ankle weakness reported Aggravating factors: extended standing/walking, exercising, painful walking after first waking in the morning; Relieving factors: pain medicine, rest (offloading), ice, compression stockings at night and sometimes during the day, no benefit with NSAIDs 24-hour pain behavior: Varies throughout the day. Bad especially first thing in the morning History of prior back, hip, knee, or ankle injury, pain, surgery, or therapy: Yes, history of R ankle surgery. R knee pain and swelling since her fall in 2008. She has also been having R low back pain since her injury. Her back pain occasionally radiates into the R hip but never into the knee or ankle;  Dominant hand: right Imaging: Yes, see history Typical footwear: pt has been wearing sneakers since her R foot/ankle pain started Red flags: Positive for weight gain, Negative for personal history of cancer, chills/fever, night sweats, nausea, vomiting;  PRECAUTIONS:  None  WEIGHT BEARING RESTRICTIONS: No  FALLS: Has patient fallen in last 6 months? No  Living Environment Lives with: lives with their daughter Lives in: House/apartment (two level townhome) Stairs: bedroom upstairs and pt has to occasionally Jaykob Minichiello one step at a time due to the pain;  Prior level of function: Independent  Occupational demands: Gaffer at [employer]  Hobbies: exercising, eating different foods, spending time with friends  Patient Goals: Pt would like to be able to exercise and walk her dog without pain, Britt Petroni  for a walk with her dog without pain    OBJECTIVE:   Patient Surveys  LEFS To be completed FOTO 47, predicted improvement to 52  Cognition Patient is oriented to person, place, and time.  Recent memory is intact.  Remote memory is intact.  Attention span and concentration are intact.  Expressive speech is intact.  Patient's fund of knowledge is within normal limits for educational level.    Gross Musculoskeletal Assessment Bulk: Normal Tone: Normal No trophic changes noted to foot/ankle. No ecchymosis, erythema, or edema noted. No gross ankle/foot deformity noted  GAIT: Full gait assessment deferred  Posture: No gross deficits in seated or standing posture which would contribute to her pain;  AROM AROM (Normal range in degrees) AROM   Hip Right Left  Flexion (125)    Extension (15)    Abduction (40)    Adduction     Internal Rotation (45)    External Rotation (45)        Knee    Flexion (135) WNL WNL  Extension (0) WNL WNL      Ankle    Dorsiflexion (20) 12 28  Plantarflexion (50) 50 42  Inversion (35) 18 30  Eversion (15) 8 18  (* = pain; Blank rows = not tested)   LE MMT: MMT (out of 5) Right  Left   Hip flexion 5 5  Hip extension    Hip abduction    Hip adduction    Hip internal rotation    Hip external rotation    Knee flexion 5 5  Knee extension 5 5  Ankle dorsiflexion 4* 5  Ankle plantarflexion  5 5  Ankle inversion 5 5  Ankle eversion 5 5  (* = pain; Blank rows = not tested)  Sensation Grossly intact to light touch throughout bilateral LEs as determined by testing dermatomes L2-S2. Proprioception, stereognosis, and hot/cold testing deferred on this date.  Reflexes Deferred  Muscle Length Deferred  Palpation Pain with palpation to anteromedial ankle near the scar from her surgery. Pain with palpation over tibialis anterior distal muscle belly and tendon. Pain to posterior tibialis tendon but no pain at navicular. Painful at base of 5th metatarsal and in space between 3rd and 4th/4th and 5th metatarsals.  Passive Accessory Motion Deferred  VASCULAR Deferred  SPECIAL TESTS Ligamentous Integrity Anterior Drawer (ATF, 10-15 plantarflexion with anterior translation): Negative Talar Tilt (CFL, inversion): Negative Eversion Stress Test (Deltoid, eversion): Negative External Rotation Test (High ankle, dorsiflexion and external rotation): Negative Squeeze Test (High ankle): Negative Impingment Sign (Dorsiflexion and eversion): Negative  Achilles Integrity Thompson Test: Negative  Fracture Screening Metatarsal Axial Loading: Negative Tap/Percussion Test: Not examined Vibration Test: Not examined  Pronation/Supination Navicular Drop: Not examined  Nerve Test Tarsal Tunnel Test (maximal DF, EV, toe ext with tapping over tarsal tunnel): Not examined Test for Morton's Neuroma (compress metatarsals and mobilize): Positive  Other Windlass Mechanism Test: Negative   Beighton Scale Deferred   TODAY'S TREATMENT  SUBJECTIVE: Patient reports "that my foot was slightly aggravated" since last PT session. Prolonged walking at [facility] caused soreness. No further questions or concerns.  PAIN: 3/10 pain at rest  Ther-ex  R Ankle Dorsiflexion, Plantarflexion, Inversion, Eversion Isometric 2 x 10 x 10s; Banded Eversion, Green TB 2 x 10;  Airex Pad BLE Calf Raises at Steps 2  x10, no pain reported; Airex Pad BLE Calf Raises with Ball inbetween Ankle 1 x 10, medial ankle pain reported;  BLE Feet on Green and Pink  balance pad, no UE support, EC 3 x 30s; Wall Supported Toe Raises 2 x 10 x 2s hold; HEP modified and reviewed with patient including demonstration;   Manual Therapy  R Ankle Dorsiflexion, Inversion, and Plantarflexion passive stretches 3 x 30s each;   Not performed BLE Ankle Drop Calf Stretch at Steps 3 x 30s;  R ankle A/P mobilizations at available end range dorsiflexion, grade I-II, 30s/bout x 2 bouts; R ankle talocrural distraction, grade I-II, 30s/bout x 3 bouts; Extensive STM to medial, anterior, and lateral ankle as well as proximally along anterior tibialis muscle;   PATIENT EDUCATION:  Education details: Pt educated throughout session about proper posture and technique with exercises. Educated patient on self STM at Tibialis anterior for pain and stiffness Improved exercise technique, movement at target joints, use of target muscles after min to mod verbal, visual, tactile cues.  Person educated: Patient Education method: Medical illustrator Education comprehension: verbalized and demonstrated understanding   HOME EXERCISE PROGRAM:  Access Code: [email or web address] URL: [email or web address] Date: 02/17/2023 Prepared by: Ria Comment  Exercises - Seated Figure 4 Ankle Inversion with Resistance  - 1 x daily - 7 x weekly - 1-2 sets - 10 reps - 2-3s hold - Seated Ankle Eversion with Resistance (Mirrored)  - 1 x daily - 7 x weekly - 1-2 sets - 10 reps - 2-3s hold - Toe Raise With Back Against Wall  - 1 x daily - 7 x weekly - 1-2 sets - 10 reps - 2-3s hold - Standing Bilateral Gastroc Stretch with Step  - 1 x daily - 7 x weekly - 3 reps - 30-45s hold   ASSESSMENT:  CLINICAL IMPRESSION: Patient arrived to physical therapy with report of minor pain due to prolonged walking on concrete surface. Today's session focused on ankle  strengthening and passive stretching to reduce muscle tension and pain. Patient demonstrated good stability at the ankle with both feet on compliant surfaces. Tolerated increased resistance at anterior tibialis with wall supported toe raises without additional pain. Educated patient on updated HEP. Will continue to progress LE strength and stability training as tolerated. Patient will continue benefit from PT services to address deficits in strength, range of motion, and pain in order to return to full function at home, work, and with leisure activities.  OBJECTIVE IMPAIRMENTS: difficulty walking, decreased strength, and pain.   ACTIVITY LIMITATIONS: standing and stairs  PARTICIPATION LIMITATIONS: shopping, community activity, occupation, and exercise  PERSONAL FACTORS: Past/current experiences and 1-2 comorbidities: anxiety and R ankle arthritis  are also affecting patient's functional outcome.   REHAB POTENTIAL: Good  CLINICAL DECISION MAKING: Evolving/moderate complexity  EVALUATION COMPLEXITY: Moderate   GOALS: Goals reviewed with patient? No  SHORT TERM GOALS: Target date: 03/01/2023  Pt will be independent with HEP to improve strength and decrease ankle pain to improve pain-free function at home and work. Baseline:  Goal status: INITIAL   LONG TERM GOALS: Target date: 03/29/2023  Pt will increase FOTO to at least 67 to demonstrate significant improvement in function at home and work related to ankle pain  Baseline: 02/01/23: 47; Goal status: INITIAL  2.  Pt will decrease worst ankle pain by at least 3 points on the NPRS in order to demonstrate clinically significant reduction in ankle pain. Baseline: 02/01/23: worst: 8/10; Goal status: INITIAL  3.  Pt will decrease LEFS score by at least 9 points in order demonstrate clinically significant reduction in ankle pain/disability.       Baseline: 02/01/23: To be  completed Goal status: INITIAL  4.  Pt will increase pain-free  strength of R ankle dorsiflexion to 5/5 MMT grade in order to demonstrate improvement in strength and function  Baseline: 02/01/23: 4/5, painful Goal status: INITIAL   PLAN: PT FREQUENCY: 1-2x/week  PT DURATION: 8 weeks  PLANNED INTERVENTIONS: Therapeutic exercises, Therapeutic activity, Neuromuscular re-education, Balance training, Gait training, Patient/Family education, Self Care, Joint mobilization, Joint manipulation, Vestibular training, Canalith repositioning, Orthotic/Fit training, DME instructions, Dry Needling, Electrical stimulation, Spinal manipulation, Spinal mobilization, Cryotherapy, Moist heat, Taping, Traction, Ultrasound, Ionotophoresis 4mg /ml Dexamethasone, Manual therapy, and Re-evaluation.  PLAN FOR NEXT SESSION: Progress strengthening and manual techniques, TDN to anterior tibialis, modify/review HEP as needed;   This entire session was performed under direct supervision and direction of a licensed therapist/therapist assistant . I have personally read, edited and approve of the note as written.   Kendricks Reap SPT Sharalyn Ink Huprich PT, DPT, GCS  Huprich,Jason, PT 02/18/2023, 10:10 AM

## 2023-02-22 ENCOUNTER — Ambulatory Visit: Payer: BC Managed Care – PPO

## 2023-02-24 ENCOUNTER — Ambulatory Visit: Payer: BC Managed Care – PPO

## 2023-02-24 DIAGNOSIS — M25571 Pain in right ankle and joints of right foot: Secondary | ICD-10-CM

## 2023-02-24 DIAGNOSIS — M6281 Muscle weakness (generalized): Secondary | ICD-10-CM

## 2023-02-24 NOTE — Therapy (Addendum)
OUTPATIENT PHYSICAL THERAPY ANKLE/FOOT TREATMENT  Patient Name: [name] MRN: [id number] DOB:1980-08-08, 42 y.o., female Today's Date: 02/28/2023  END OF SESSION:  PT End of Session - 02/28/23 0807     Visit Number 7    Number of Visits 17    Date for PT Re-Evaluation 03/29/23    Authorization Type eval: 02/01/23    PT Start Time 1705    PT Stop Time 1750    PT Time Calculation (min) 45 min    Activity Tolerance Patient tolerated treatment well    Behavior During Therapy [facility] for tasks assessed/performed            Past Medical History:  Diagnosis Date   Anxiety    Arthritis    right ankle   Bony exostosis 09/2014   right knee   GERD (gastroesophageal reflux disease)    History of migraine    Hypertension    Neuromuscular disorder (HCC)    trigeminal nerve pain   Pain, eye, left 10/10/2014   with tingling - states possible shingles; to see PCP   Painful orthopaedic hardware (HCC) 09/2014   right ankle   Past Surgical History:  Procedure Laterality Date   BONE EXOSTOSIS EXCISION Right 10/16/2014   Procedure: EXCISION OF BONY EXOSTOSIS FROM RIGHT KNEE;  Surgeon: Robin Kos, MD;  Location: [facility];  Service: Orthopedics;  Laterality: Right;   CHOLECYSTECTOMY  07/12/2006   DILATATION & CURRETTAGE/HYSTEROSCOPY WITH RESECTOCOPE N/A 09/24/2022   Procedure: DILATATION & CURETTAGE/HYSTEROSCOPY WITH RESECTOCOPE;  Surgeon: Robin Better, MD;  Location: [facility];  Service: Gynecology;  Laterality: N/A;   ENDOMETRIAL ABLATION N/A 09/24/2022   Procedure: ENDOMETRIAL ABLATION;  Surgeon: Robin Better, MD;  Location: [facility];  Service: Gynecology;  Laterality: N/A;   HARDWARE REMOVAL Right 10/16/2014   Procedure: HARDWARE REMOVAL RIGHT ANKLE;  Surgeon: Robin Kos, MD;  Location:  SURGERY CENTER;  Service: Orthopedics;  Laterality: Right;   KNEE ARTHROSCOPY Right 09/01/2007   ORIF ANKLE FRACTURE  BIMALLEOLAR Right 02/28/2007   ORIF TIBIA FRACTURE Right 02/28/2007   rod placement right   TIBIA HARDWARE REMOVAL Right 09/01/2007   There are no problems to display for this patient.   PCP: Robin Mina MD  REFERRING PROVIDER: Gershon Mussel MD  REFERRING DIAG: [phone] (ICD-10-CM) - Pain in right ankle and joints of right foot   Rationale for Evaluation and Treatment: Rehabilitation  THERAPY DIAG: Pain in right ankle and joints of right foot  Muscle weakness (generalized)  ONSET DATE: Early 2024  FOLLOW-UP APPT SCHEDULED WITH REFERRING PROVIDER: No   From Initial Evaluation SUBJECTIVE:  SUBJECTIVE STATEMENT:  R ankle/foot pain  PERTINENT HISTORY:  Patient is a pleasant 42 year old female who was referred to physical therapy for right ankle pain. She underwent R ankle ORIF after "shattering my ankle" (trimalleolar fracture) in 2008 when she fell down stairs. She is now status post right tibial nail with subsequent hardware removal (lateral plate for fibula fracture removed) back in 2016. She still has the tibial IM nail in place. Pt was doing well until earlier this year when her R foot and ankle started to hurt. She denies any acute injury and states that the only thing she can think might have contributed to her pain was a change in her footwear. She went to [facility] where plain film radiographs showed no acute changes. She was given a home exercise program which did help however pain still remains so she was seen by Dr. Roda Long in [place] who referred her for physical therapy. She is now having intermittent discomfort to the R anterior/anteromedial ankle as well as pain in the distal lateral foot (ankle pain started first followed by foot pain at later date). She has pain most days and symptoms are  worse with walking as well as at the end of the day. She notes occasional anterior/anterolateral swelling in the ankle.    R ankle XR (03/01/2007) IMPRESSION:   Spiral type fracture involving the distal fibular shaft above the ankle mortise. There are also two fractures involving the tibia, one is in the mid-tibial shaft region and the other is distally.   XR Ankle Complete Right (01/05/2023) No acute fracture noted.  She does have mild degenerative changes throughout the ankle in addition to a healed fibula fracture.  No hardware complication of the tibial nail.  PAIN:    Pain Intensity: Present: 0/10, Best: 0/10, Worst: 8/10 Pain location: Anterior and anteromedial ankle "deep soreness", R foot pain over the distal 4th and 5th metatarsals (dull ache).  Pain Quality: dull and sore Radiating: No  Swelling: Yes, anterior/anteromedial ankle; Popping, catching, locking: Yes, occasional "pop" which offers pain relief; Numbness/Tingling: No Focal Weakness: Yes, some R ankle weakness reported Aggravating factors: extended standing/walking, exercising, painful walking after first waking in the morning; Relieving factors: pain medicine, rest (offloading), ice, compression stockings at night and sometimes during the day, no benefit with NSAIDs 24-hour pain behavior: Varies throughout the day. Bad especially first thing in the morning History of prior back, hip, knee, or ankle injury, pain, surgery, or therapy: Yes, history of R ankle surgery. R knee pain and swelling since her fall in 2008. She has also been having R low back pain since her injury. Her back pain occasionally radiates into the R hip but never into the knee or ankle;  Dominant hand: right Imaging: Yes, see history Typical footwear: pt has been wearing sneakers since her R foot/ankle pain started Red flags: Positive for weight gain, Negative for personal history of cancer, chills/fever, night sweats, nausea, vomiting;  PRECAUTIONS:  None  WEIGHT BEARING RESTRICTIONS: No  FALLS: Has patient fallen in last 6 months? No  Living Environment Lives with: lives with their daughter Lives in: House/apartment (two level townhome) Stairs: bedroom upstairs and pt has to occasionally [name] one step at a time due to the pain;  Prior level of function: Independent  Occupational demands: Gaffer at [employer]  Hobbies: exercising, eating different foods, spending time with friends  Patient Goals: Pt would like to be able to exercise and walk her dog without pain, Tiphany Fayson  for a walk with her dog without pain    OBJECTIVE:   Patient Surveys  LEFS To be completed FOTO 47, predicted improvement to 12  Cognition Patient is oriented to person, place, and time.  Recent memory is intact.  Remote memory is intact.  Attention span and concentration are intact.  Expressive speech is intact.  Patient's fund of knowledge is within normal limits for educational level.    Gross Musculoskeletal Assessment Bulk: Normal Tone: Normal No trophic changes noted to foot/ankle. No ecchymosis, erythema, or edema noted. No gross ankle/foot deformity noted  GAIT: Full gait assessment deferred  Posture: No gross deficits in seated or standing posture which would contribute to her pain;  AROM AROM (Normal range in degrees) AROM   Hip Right Left  Flexion (125)    Extension (15)    Abduction (40)    Adduction     Internal Rotation (45)    External Rotation (45)        Knee    Flexion (135) WNL WNL  Extension (0) WNL WNL      Ankle    Dorsiflexion (20) 12 28  Plantarflexion (50) 50 42  Inversion (35) 18 30  Eversion (15) 8 18  (* = pain; Blank rows = not tested)   LE MMT: MMT (out of 5) Right  Left   Hip flexion 5 5  Hip extension    Hip abduction    Hip adduction    Hip internal rotation    Hip external rotation    Knee flexion 5 5  Knee extension 5 5  Ankle dorsiflexion 4* 5  Ankle plantarflexion  5 5  Ankle inversion 5 5  Ankle eversion 5 5  (* = pain; Blank rows = not tested)  Sensation Grossly intact to light touch throughout bilateral LEs as determined by testing dermatomes L2-S2. Proprioception, stereognosis, and hot/cold testing deferred on this date.  Reflexes Deferred  Muscle Length Deferred  Palpation Pain with palpation to anteromedial ankle near the scar from her surgery. Pain with palpation over tibialis anterior distal muscle belly and tendon. Pain to posterior tibialis tendon but no pain at navicular. Painful at base of 5th metatarsal and in space between 3rd and 4th/4th and 5th metatarsals.  Passive Accessory Motion Deferred  VASCULAR Deferred  SPECIAL TESTS Ligamentous Integrity Anterior Drawer (ATF, 10-15 plantarflexion with anterior translation): Negative Talar Tilt (CFL, inversion): Negative Eversion Stress Test (Deltoid, eversion): Negative External Rotation Test (High ankle, dorsiflexion and external rotation): Negative Squeeze Test (High ankle): Negative Impingment Sign (Dorsiflexion and eversion): Negative  Achilles Integrity Thompson Test: Negative  Fracture Screening Metatarsal Axial Loading: Negative Tap/Percussion Test: Not examined Vibration Test: Not examined  Pronation/Supination Navicular Drop: Not examined  Nerve Test Tarsal Tunnel Test (maximal DF, EV, toe ext with tapping over tarsal tunnel): Not examined Test for Morton's Neuroma (compress metatarsals and mobilize): Positive  Other Windlass Mechanism Test: Negative   Beighton Scale Deferred   TODAY'S TREATMENT  SUBJECTIVE: Patient arrived to physical therapy with improved symptoms at the ankle.  No further questions or concerns.  PAIN: 6-7/10 pain at rest  Ther-ex  Airex Pad BLE Calf Raises with Ball inbetween Ankle 2 x 10, no medial ankle pain reported;  Ankle Rocker 2 x 10 with intermittent perturbations; BLE Ankle Drop Calf Stretch and Calf Raise 2 x 10; RLE  on Airex with Sliding Lunge 2 x 10, Single UE support (pain in anterior TC joint);  Marble Pick up x multiple bouts;  Single leg Balance with hand hover support on Airex 3 x 30s;  Towel Scrunches x multiple bouts;  HEP modified and reviewed with patient including demonstration;   Manual Therapy  Extensive STM and Myofascial release on Anterior Tibialis, Medial Gastrocnemius.    Not performed Airex Pad BLE Calf Raises at Steps 2 x10, no pain reported; Banded Eversion, Green TB 2 x 10;  BLE Feet on Green and Pink balance pad, no UE support, EC 3 x 30s; R Ankle Dorsiflexion, Plantarflexion, Inversion, Eversion Isometric 2 x 10 x 10s; R ankle A/P mobilizations at available end range dorsiflexion, grade I-II, 30s/bout x 2 bouts; R ankle talocrural distraction, grade I-II, 30s/bout x 3 bouts; Extensive STM to medial, anterior, and lateral ankle as well as proximally along anterior tibialis muscle; Wall Supported Toe Raises 2 x 10 x 2s hold;   PATIENT EDUCATION:  Education details: Pt educated throughout session about proper posture and technique with exercises. Educated patient on self STM at Tibialis anterior for pain and stiffness Improved exercise technique, movement at target joints, use of target muscles after min to mod verbal, visual, tactile cues.  Person educated: Patient Education method: Medical illustrator Education comprehension: verbalized and demonstrated understanding   HOME EXERCISE PROGRAM:  Access Code: [email or web address] URL: [email or web address] Date: 02/17/2023 Prepared by: Ria Comment  Exercises - Seated Figure 4 Ankle Inversion with Resistance  - 1 x daily - 7 x weekly - 1-2 sets - 10 reps - 2-3s hold - Seated Ankle Eversion with Resistance (Mirrored)  - 1 x daily - 7 x weekly - 1-2 sets - 10 reps - 2-3s hold - Toe Raise With Back Against Wall  - 1 x daily - 7 x weekly - 1-2 sets - 10 reps - 2-3s hold (On Pause)  - Standing Bilateral Gastroc  Stretch with Step  - 1 x daily - 7 x weekly - 3 reps - 30-45s hold   ASSESSMENT:  CLINICAL IMPRESSION: Patient arrived to physical therapy highly motivated. Progressed patient to single leg balance in order to strengthen extrinsic foot muscles. Patient able to tolerate static single leg balance without additional reports of pain. She required tactile and intermittent UE support in order to stabilize single leg stance. No notable swelling in ankle joint this session. PT educated on continued stretch and self mobilization for extrinsic muscles to reduce muscle tension. Plan to continue progressing ankle strengthening and single leg balance to improve ankle stabilization. Will continue to progress LE strength and stability training as tolerated. Patient will continue benefit from PT services to address deficits in strength, range of motion, and pain in order to return to full function at home, work, and with leisure activities.  OBJECTIVE IMPAIRMENTS: difficulty walking, decreased strength, and pain.   ACTIVITY LIMITATIONS: standing and stairs  PARTICIPATION LIMITATIONS: shopping, community activity, occupation, and exercise  PERSONAL FACTORS: Past/current experiences and 1-2 comorbidities: anxiety and R ankle arthritis  are also affecting patient's functional outcome.   REHAB POTENTIAL: Good  CLINICAL DECISION MAKING: Evolving/moderate complexity  EVALUATION COMPLEXITY: Moderate   GOALS: Goals reviewed with patient? No  SHORT TERM GOALS: Target date: 03/01/2023  Pt will be independent with HEP to improve strength and decrease ankle pain to improve pain-free function at home and work. Baseline:  Goal status: INITIAL   LONG TERM GOALS: Target date: 03/29/2023  Pt will increase FOTO to at least 67 to demonstrate significant improvement in function at home and work related to ankle pain  Baseline: 02/01/23: 47; Goal  status: INITIAL  2.  Pt will decrease worst ankle pain by at least 3  points on the NPRS in order to demonstrate clinically significant reduction in ankle pain. Baseline: 02/01/23: worst: 8/10; Goal status: INITIAL  3.  Pt will decrease LEFS score by at least 9 points in order demonstrate clinically significant reduction in ankle pain/disability.       Baseline: 02/01/23: To be completed Goal status: INITIAL  4.  Pt will increase pain-free strength of R ankle dorsiflexion to 5/5 MMT grade in order to demonstrate improvement in strength and function  Baseline: 02/01/23: 4/5, painful Goal status: INITIAL   PLAN: PT FREQUENCY: 1-2x/week  PT DURATION: 8 weeks  PLANNED INTERVENTIONS: Therapeutic exercises, Therapeutic activity, Neuromuscular re-education, Balance training, Gait training, Patient/Family education, Self Care, Joint mobilization, Joint manipulation, Vestibular training, Canalith repositioning, Orthotic/Fit training, DME instructions, Dry Needling, Electrical stimulation, Spinal manipulation, Spinal mobilization, Cryotherapy, Moist heat, Taping, Traction, Ultrasound, Ionotophoresis 4mg /ml Dexamethasone, Manual therapy, and Re-evaluation.  PLAN FOR NEXT SESSION: Progress strengthening and manual techniques, TDN to anterior tibialis, modify/review HEP as needed;   Cristal Deer Shakiah Wester SPT Barbara Cower D Huprich PT, DPT, GCS

## 2023-02-25 ENCOUNTER — Ambulatory Visit (INDEPENDENT_AMBULATORY_CARE_PROVIDER_SITE_OTHER): Payer: BC Managed Care – PPO | Admitting: Podiatry

## 2023-02-25 DIAGNOSIS — M7751 Other enthesopathy of right foot: Secondary | ICD-10-CM

## 2023-02-25 MED ORDER — MELOXICAM 15 MG PO TABS: 15.0000 mg | ORAL_TABLET | Freq: Every day | ORAL | 1 refills | Status: DC

## 2023-02-25 MED ORDER — BETAMETHASONE SOD PHOS & ACET 6 (3-3) MG/ML IJ SUSP
3.0000 mg | Freq: Once | INTRAMUSCULAR | Status: AC
  Administered 2023-02-25: 3 mg via INTRA_ARTICULAR

## 2023-02-25 MED ORDER — METHYLPREDNISOLONE 4 MG PO TBPK: ORAL_TABLET | ORAL | 0 refills | Status: DC

## 2023-02-25 NOTE — Progress Notes (Signed)
Chief Complaint  Patient presents with   Foot Pain    Referral from PT, [facility], for right foot for pain/swelling when walking on the ball of foo and on top of foot.she has been seeing another physician and xrays were taken in June of the right foot and ankle    Subjective:  42 y.o. female presenting today as a new patient and for second opinion regarding right ankle pain.  Patient has a history of ORIF right ankle 2008 followed by removal of fibular hardware 2016.  Patient states that in February 2024 she began to experience right ankle pain.  Idiopathic onset.  After few months she began to experience right foot pain.  She does admit to compensating with an altered gait.  Denies any history of injury or change in activity.  She was seen at [facility] and also with Ortho care and physical therapy was initiated.  She says that physical therapy is helping but she continues to have pain and tenderness to the right ankle and foot   Past Medical History:  Diagnosis Date   Anxiety    Arthritis    right ankle   Bony exostosis 09/2014   right knee   GERD (gastroesophageal reflux disease)    History of migraine    Hypertension    Neuromuscular disorder (HCC)    trigeminal nerve pain   Pain, eye, left 10/10/2014   with tingling - states possible shingles; to see PCP   Painful orthopaedic hardware (HCC) 09/2014   right ankle    Past Surgical History:  Procedure Laterality Date   BONE EXOSTOSIS EXCISION Right 10/16/2014   Procedure: EXCISION OF BONY EXOSTOSIS FROM RIGHT KNEE;  Surgeon: Tarry Kos, MD;  Location: [facility];  Service: Orthopedics;  Laterality: Right;   CHOLECYSTECTOMY  07/12/2006   DILATATION & CURRETTAGE/HYSTEROSCOPY WITH RESECTOCOPE N/A 09/24/2022   Procedure: DILATATION & CURETTAGE/HYSTEROSCOPY WITH RESECTOCOPE;  Surgeon: Maxie Better, MD;  Location: [facility];  Service: Gynecology;  Laterality: N/A;   ENDOMETRIAL ABLATION  N/A 09/24/2022   Procedure: ENDOMETRIAL ABLATION;  Surgeon: Maxie Better, MD;  Location: [facility];  Service: Gynecology;  Laterality: N/A;   HARDWARE REMOVAL Right 10/16/2014   Procedure: HARDWARE REMOVAL RIGHT ANKLE;  Surgeon: Tarry Kos, MD;  Location: [facility];  Service: Orthopedics;  Laterality: Right;   KNEE ARTHROSCOPY Right 09/01/2007   ORIF ANKLE FRACTURE BIMALLEOLAR Right 02/28/2007   ORIF TIBIA FRACTURE Right 02/28/2007   rod placement right   TIBIA HARDWARE REMOVAL Right 09/01/2007    Allergies  Allergen Reactions   Penicillin G Other (See Comments)   Yeast-Related Products Other (See Comments)    States she avoids yeast products as they cause her yeast infections   Penicillins Other (See Comments)    UNKNOWN - AS AN INFANT    Objective / Physical Exam:  General:  The patient is alert and oriented x3 in no acute distress. Dermatology:  Skin is warm, dry and supple bilateral lower extremities. Negative for open lesions or macerations. Vascular:  Palpable pedal pulses bilaterally. No edema or erythema noted. Capillary refill within normal limits. Neurological:  Epicritic and protective threshold grossly intact bilaterally.  Musculoskeletal Exam:  Pain on palpation to the anterior lateral and medial aspects of the patient's right ankle.  No pain with range of motion.  Range of motion within normal limits to all pedal and ankle joints bilateral. Muscle strength 5/5 in all groups bilateral.  There is some compensatory pain to the lateral forefoot around the fourth and fifth metatarsals and along the lateral column of the foot.  XR Ankle Complete Right 01/05/2023 No acute fracture noted.  She does have mild degenerative changes  throughout the ankle in addition to a healed fibula fracture.  No hardware complication of the tibial nail.     XR Foot Complete Right 01/05/2023 Narrative No acute or structural abnormalities    Assessment: 1.  Capsulitis right ankle 2.  Compensatory lateral and forefoot pain  Plan of Care:  -Patient was evaluated. X-Rays reviewed.  -Injection of 0.5 mL Celestone Soluspan injected in the patient's right ankle. -Prescription for Medrol Dosepak -Prescription for meloxicam 15 mg daily after completion of the Dosepak -Ankle brace dispensed.  Wear daily -Continue physical therapy at [facility] w/ Ria Comment PT -Return to clinic 4 weeks   Felecia Shelling, DPM [facility]  Dr. Felecia Shelling, DPM    [address] [place], [place] [address]                Office [phone]  Fax [phone]

## 2023-03-01 ENCOUNTER — Ambulatory Visit: Payer: BC Managed Care – PPO

## 2023-03-02 ENCOUNTER — Telehealth: Payer: Self-pay | Admitting: Podiatry

## 2023-03-02 NOTE — Telephone Encounter (Signed)
Patient called stating she saw Dr Logan Bores on Friday and was given a ankle brace. Patient states her foot is hurting more with the brace, is there anything else she can get or do that would help but not be painful. Please call pt [phone].

## 2023-03-03 ENCOUNTER — Ambulatory Visit: Payer: BC Managed Care – PPO

## 2023-03-03 DIAGNOSIS — M25571 Pain in right ankle and joints of right foot: Secondary | ICD-10-CM

## 2023-03-03 DIAGNOSIS — M6281 Muscle weakness (generalized): Secondary | ICD-10-CM

## 2023-03-03 NOTE — Therapy (Addendum)
OUTPATIENT PHYSICAL THERAPY ANKLE/FOOT TREATMENT  Patient Name: [name] MRN: [id number] DOB:1980-12-30, 42 y.o., female Today's Date: 03/04/2023  END OF SESSION:  PT End of Session - 03/03/23 1700     Visit Number 8    Number of Visits 17    Date for PT Re-Evaluation 03/29/23    Authorization Type eval: 02/01/23    PT Start Time 1700    PT Stop Time 1745    PT Time Calculation (min) 45 min    Activity Tolerance Patient tolerated treatment well    Behavior During Therapy [facility] for tasks assessed/performed            Past Medical History:  Diagnosis Date   Anxiety    Arthritis    right ankle   Bony exostosis 09/2014   right knee   GERD (gastroesophageal reflux disease)    History of migraine    Hypertension    Neuromuscular disorder (HCC)    trigeminal nerve pain   Pain, eye, left 10/10/2014   with tingling - states possible shingles; to see PCP   Painful orthopaedic hardware (HCC) 09/2014   right ankle   Past Surgical History:  Procedure Laterality Date   BONE EXOSTOSIS EXCISION Right 10/16/2014   Procedure: EXCISION OF BONY EXOSTOSIS FROM RIGHT KNEE;  Surgeon: Robin Kos, MD;  Location: [facility];  Service: Orthopedics;  Laterality: Right;   CHOLECYSTECTOMY  07/12/2006   DILATATION & CURRETTAGE/HYSTEROSCOPY WITH RESECTOCOPE N/A 09/24/2022   Procedure: DILATATION & CURETTAGE/HYSTEROSCOPY WITH RESECTOCOPE;  Surgeon: Robin Better, MD;  Location: [facility];  Service: Gynecology;  Laterality: N/A;   ENDOMETRIAL ABLATION N/A 09/24/2022   Procedure: ENDOMETRIAL ABLATION;  Surgeon: Robin Better, MD;  Location: [facility] [place];  Service: Gynecology;  Laterality: N/A;   HARDWARE REMOVAL Right 10/16/2014   Procedure: HARDWARE REMOVAL RIGHT ANKLE;  Surgeon: Robin Kos, MD;  Location: [facility];  Service: Orthopedics;  Laterality: Right;   KNEE ARTHROSCOPY Right 09/01/2007   ORIF ANKLE FRACTURE  BIMALLEOLAR Right 02/28/2007   ORIF TIBIA FRACTURE Right 02/28/2007   rod placement right   TIBIA HARDWARE REMOVAL Right 09/01/2007   There are no problems to display for this patient.   PCP: Robin Mina MD  REFERRING PROVIDER: Gershon Mussel MD  REFERRING DIAG: [id number] (ICD-10-CM) - Pain in right ankle and joints of right foot   Rationale for Evaluation and Treatment: Rehabilitation  THERAPY DIAG: Pain in right ankle and joints of right foot  Muscle weakness (generalized)  ONSET DATE: Early 2024  FOLLOW-UP APPT SCHEDULED WITH REFERRING PROVIDER: No   From Initial Evaluation SUBJECTIVE:  SUBJECTIVE STATEMENT:  R ankle/foot pain  PERTINENT HISTORY:  Patient is a pleasant 42 year old female who was referred to physical therapy for right ankle pain. She underwent R ankle ORIF after "shattering my ankle" (trimalleolar fracture) in 2008 when she fell down stairs. She is now status post right tibial nail with subsequent hardware removal (lateral plate for fibula fracture removed) back in 2016. She still has the tibial IM nail in place. Pt was doing well until earlier this year when her R foot and ankle started to hurt. She denies any acute injury and states that the only thing she can think might have contributed to her pain was a change in her footwear. She went to [facility] where plain film radiographs showed no acute changes. She was given a home exercise program which did help however pain still remains so she was seen by Dr. Roda Long in [place] who referred her for physical therapy. She is now having intermittent discomfort to the R anterior/anteromedial ankle as well as pain in the distal lateral foot (ankle pain started first followed by foot pain at later date). She has pain most days and symptoms are  worse with walking as well as at the end of the day. She notes occasional anterior/anterolateral swelling in the ankle.    R ankle XR (03/01/2007) IMPRESSION:   Spiral type fracture involving the distal fibular shaft above the ankle mortise. There are also two fractures involving the tibia, one is in the mid-tibial shaft region and the other is distally.   XR Ankle Complete Right (01/05/2023) No acute fracture noted.  She does have mild degenerative changes throughout the ankle in addition to a healed fibula fracture.  No hardware complication of the tibial nail.  PAIN:    Pain Intensity: Present: 0/10, Best: 0/10, Worst: 8/10 Pain location: Anterior and anteromedial ankle "deep soreness", R foot pain over the distal 4th and 5th metatarsals (dull ache).  Pain Quality: dull and sore Radiating: No  Swelling: Yes, anterior/anteromedial ankle; Popping, catching, locking: Yes, occasional "pop" which offers pain relief; Numbness/Tingling: No Focal Weakness: Yes, some R ankle weakness reported Aggravating factors: extended standing/walking, exercising, painful walking after first waking in the morning; Relieving factors: pain medicine, rest (offloading), ice, compression stockings at night and sometimes during the day, no benefit with NSAIDs 24-hour pain behavior: Varies throughout the day. Bad especially first thing in the morning History of prior back, hip, knee, or ankle injury, pain, surgery, or therapy: Yes, history of R ankle surgery. R knee pain and swelling since her fall in 2008. She has also been having R low back pain since her injury. Her back pain occasionally radiates into the R hip but never into the knee or ankle;  Dominant hand: right Imaging: Yes, see history Typical footwear: pt has been wearing sneakers since her R foot/ankle pain started Red flags: Positive for weight gain, Negative for personal history of cancer, chills/fever, night sweats, nausea, vomiting;  PRECAUTIONS:  None  WEIGHT BEARING RESTRICTIONS: No  FALLS: Has patient fallen in last 6 months? No  Living Environment Lives with: lives with their daughter Lives in: House/apartment (two level townhome) Stairs: bedroom upstairs and pt has to occasionally [name] one step at a time due to the pain;  Prior level of function: Independent  Occupational demands: Gaffer at [employer]  Hobbies: exercising, eating different foods, spending time with friends  Patient Goals: Pt would like to be able to exercise and walk her dog without pain, Takeyah Wieman  for a walk with her dog without pain    OBJECTIVE:   Patient Surveys  LEFS To be completed FOTO 47, predicted improvement to 79  Cognition Patient is oriented to person, place, and time.  Recent memory is intact.  Remote memory is intact.  Attention span and concentration are intact.  Expressive speech is intact.  Patient's fund of knowledge is within normal limits for educational level.    Gross Musculoskeletal Assessment Bulk: Normal Tone: Normal No trophic changes noted to foot/ankle. No ecchymosis, erythema, or edema noted. No gross ankle/foot deformity noted  GAIT: Full gait assessment deferred  Posture: No gross deficits in seated or standing posture which would contribute to her pain;  AROM AROM (Normal range in degrees) AROM   Hip Right Left  Flexion (125)    Extension (15)    Abduction (40)    Adduction     Internal Rotation (45)    External Rotation (45)        Knee    Flexion (135) WNL WNL  Extension (0) WNL WNL      Ankle    Dorsiflexion (20) 12 28  Plantarflexion (50) 50 42  Inversion (35) 18 30  Eversion (15) 8 18  (* = pain; Blank rows = not tested)   LE MMT: MMT (out of 5) Right  Left   Hip flexion 5 5  Hip extension    Hip abduction    Hip adduction    Hip internal rotation    Hip external rotation    Knee flexion 5 5  Knee extension 5 5  Ankle dorsiflexion 4* 5  Ankle plantarflexion  5 5  Ankle inversion 5 5  Ankle eversion 5 5  (* = pain; Blank rows = not tested)  Sensation Grossly intact to light touch throughout bilateral LEs as determined by testing dermatomes L2-S2. Proprioception, stereognosis, and hot/cold testing deferred on this date.  Reflexes Deferred  Muscle Length Deferred  Palpation Pain with palpation to anteromedial ankle near the scar from her surgery. Pain with palpation over tibialis anterior distal muscle belly and tendon. Pain to posterior tibialis tendon but no pain at navicular. Painful at base of 5th metatarsal and in space between 3rd and 4th/4th and 5th metatarsals.  Passive Accessory Motion Deferred  VASCULAR Deferred  SPECIAL TESTS Ligamentous Integrity Anterior Drawer (ATF, 10-15 plantarflexion with anterior translation): Negative Talar Tilt (CFL, inversion): Negative Eversion Stress Test (Deltoid, eversion): Negative External Rotation Test (High ankle, dorsiflexion and external rotation): Negative Squeeze Test (High ankle): Negative Impingment Sign (Dorsiflexion and eversion): Negative  Achilles Integrity Thompson Test: Negative  Fracture Screening Metatarsal Axial Loading: Negative Tap/Percussion Test: Not examined Vibration Test: Not examined  Pronation/Supination Navicular Drop: Not examined  Nerve Test Tarsal Tunnel Test (maximal DF, EV, toe ext with tapping over tarsal tunnel): Not examined Test for Morton's Neuroma (compress metatarsals and mobilize): Positive  Other Windlass Mechanism Test: Negative   Beighton Scale Deferred   TODAY'S TREATMENT  SUBJECTIVE: Patient arrived to physical therapy with improved symptoms at the ankle. Followed up with podiatrist and received a steroid injection, steroid taper, and ankle brace. She reports that the brace is causing some ankle pain.  No further questions or concerns.  PAIN: 5/10 pain at rest  Ther-ex  Dorsiflexion, Eversion, Plantarflexion, Inversion 2 x 10  Red band & Green Band;  Airex Pad BLE Calf Raises with Ball inbetween Ankle 2 x 10, no medial ankle pain reported;  Airex Beam, Tandem walk with hand  hover support x multiple length  Airex Single leg Stance BLE 2 x 30s BLE Ankle Drop Calf Stretch and Calf Raise 1 x 10; BLE Ankle Drop and Calf Raise with ball squeeze 2 x 10; Lunge with Front LE on dynadisc BLE 1 x 10; Single Leg Stance with Cone Taps x multiple bouts;  HEP modified (provided green band) and reviewed with patient;   Not performed Airex Pad BLE Calf Raises at Steps 2 x10, no pain reported; Banded Eversion, Green TB 2 x 10;  BLE Feet on Green and Pink balance pad, no UE support, EC 3 x 30s; R Ankle Dorsiflexion, Plantarflexion, Inversion, Eversion Isometric 2 x 10 x 10s; R ankle A/P mobilizations at available end range dorsiflexion, grade I-II, 30s/bout x 2 bouts; R ankle talocrural distraction, grade I-II, 30s/bout x 3 bouts; Extensive STM to medial, anterior, and lateral ankle as well as proximally along anterior tibialis muscle; Wall Supported Toe Raises 2 x 10 x 2s hold;   PATIENT EDUCATION:  Education details: Pt educated throughout session about proper posture and technique with exercises. Educated patient on self STM at Tibialis anterior for pain and stiffness Improved exercise technique, movement at target joints, use of target muscles after min to mod verbal, visual, tactile cues.  Person educated: Patient Education method: Medical illustrator Education comprehension: verbalized and demonstrated understanding   HOME EXERCISE PROGRAM:  Access Code: [email or web address] URL: [email or web address] Date: 02/17/2023 Prepared by: Ria Comment  Exercises - Seated Figure 4 Ankle Inversion with Resistance  - 1 x daily - 7 x weekly - 1-2 sets - 10 reps - 2-3s hold - Seated Ankle Eversion with Resistance (Mirrored)  - 1 x daily - 7 x weekly - 1-2 sets - 10 reps - 2-3s hold - Toe Raise With Back Against  Wall  - 1 x daily - 7 x weekly - 1-2 sets - 10 reps - 2-3s hold (On Pause)  - Standing Bilateral Gastroc Stretch with Step  - 1 x daily - 7 x weekly - 3 reps - 30-45s hold   ASSESSMENT:  CLINICAL IMPRESSION: Patient arrived to physical therapy highly motivated. Pt reported that physician suggested that pain near the  4th and 5th metatarsal possibly due to stress fx. PT recommended to limit prolonged weight bearing activities. Progressed lower leg strength with additional single leg exercises to improve strength and stability. Patient tolerated increased activity until end of session with report of tightness in the dorsal aspect of the foot. PT recommended pt to bring ankle brace in next appointment to assess proper fitting and activity tolerance with brace wear. PT will continue to progress LE strength and stability training as tolerated. Patient will continue benefit from PT services to address deficits in strength, range of motion, and pain in order to return to full function at home, work, and with leisure activities.  OBJECTIVE IMPAIRMENTS: difficulty walking, decreased strength, and pain.   ACTIVITY LIMITATIONS: standing and stairs  PARTICIPATION LIMITATIONS: shopping, community activity, occupation, and exercise  PERSONAL FACTORS: Past/current experiences and 1-2 comorbidities: anxiety and R ankle arthritis  are also affecting patient's functional outcome.   REHAB POTENTIAL: Good  CLINICAL DECISION MAKING: Evolving/moderate complexity  EVALUATION COMPLEXITY: Moderate   GOALS: Goals reviewed with patient? No  SHORT TERM GOALS: Target date: 03/01/2023  Pt will be independent with HEP to improve strength and decrease ankle pain to improve pain-free function at home and work. Baseline:  Goal status: INITIAL   LONG TERM GOALS: Target date: 03/29/2023  Pt will increase FOTO to at least 67 to demonstrate significant improvement in function at home and work related to ankle pain   Baseline: 02/01/23: 47; Goal status: INITIAL  2.  Pt will decrease worst ankle pain by at least 3 points on the NPRS in order to demonstrate clinically significant reduction in ankle pain. Baseline: 02/01/23: worst: 8/10; Goal status: INITIAL  3.  Pt will decrease LEFS score by at least 9 points in order demonstrate clinically significant reduction in ankle pain/disability.       Baseline: 02/01/23: To be completed Goal status: INITIAL  4.  Pt will increase pain-free strength of R ankle dorsiflexion to 5/5 MMT grade in order to demonstrate improvement in strength and function  Baseline: 02/01/23: 4/5, painful Goal status: INITIAL   PLAN: PT FREQUENCY: 1-2x/week  PT DURATION: 8 weeks  PLANNED INTERVENTIONS: Therapeutic exercises, Therapeutic activity, Neuromuscular re-education, Balance training, Gait training, Patient/Family education, Self Care, Joint mobilization, Joint manipulation, Vestibular training, Canalith repositioning, Orthotic/Fit training, DME instructions, Dry Needling, Electrical stimulation, Spinal manipulation, Spinal mobilization, Cryotherapy, Moist heat, Taping, Traction, Ultrasound, Ionotophoresis 4mg /ml Dexamethasone, Manual therapy, and Re-evaluation.  PLAN FOR NEXT SESSION: Progress strengthening and manual techniques, TDN to anterior tibialis, modify/review HEP as needed;   Cristal Deer Jatinder Mcdonagh SPT Barbara Cower D Huprich PT, DPT, GCS

## 2023-03-08 ENCOUNTER — Ambulatory Visit: Payer: BC Managed Care – PPO

## 2023-03-08 ENCOUNTER — Encounter: Payer: Self-pay | Admitting: Podiatry

## 2023-03-08 DIAGNOSIS — M6281 Muscle weakness (generalized): Secondary | ICD-10-CM

## 2023-03-08 DIAGNOSIS — M25571 Pain in right ankle and joints of right foot: Secondary | ICD-10-CM | POA: Diagnosis not present

## 2023-03-08 NOTE — Therapy (Deleted)
OUTPATIENT PHYSICAL THERAPY ANKLE/FOOT TREATMENT  Patient Name: [name] MRN: [id number] DOB:1981/02/07, 42 y.o., female Today's Date: 03/08/2023  END OF SESSION:  PT End of Session - 03/08/23 1650     Visit Number 9    Number of Visits 17    Date for PT Re-Evaluation 03/29/23    Authorization Type eval: 02/01/23    PT Start Time 1650    PT Stop Time 1735    PT Time Calculation (min) 45 min    Equipment Utilized During Treatment Other (comment)   BioSkin Trilock Ankle Brace   Activity Tolerance Patient tolerated treatment well    Behavior During Therapy WFL for tasks assessed/performed             Past Medical History:  Diagnosis Date   Anxiety    Arthritis    right ankle   Bony exostosis 09/2014   right knee   GERD (gastroesophageal reflux disease)    History of migraine    Hypertension    Neuromuscular disorder (HCC)    trigeminal nerve pain   Pain, eye, left 10/10/2014   with tingling - states possible shingles; to see PCP   Painful orthopaedic hardware (HCC) 09/2014   right ankle   Past Surgical History:  Procedure Laterality Date   BONE EXOSTOSIS EXCISION Right 10/16/2014   Procedure: EXCISION OF BONY EXOSTOSIS FROM RIGHT KNEE;  Surgeon: Robin Kos, MD;  Location: [facility];  Service: Orthopedics;  Laterality: Right;   CHOLECYSTECTOMY  07/12/2006   DILATATION & CURRETTAGE/HYSTEROSCOPY WITH RESECTOCOPE N/A 09/24/2022   Procedure: DILATATION & CURETTAGE/HYSTEROSCOPY WITH RESECTOCOPE;  Surgeon: Robin Better, MD;  Location: [facility];  Service: Gynecology;  Laterality: N/A;   ENDOMETRIAL ABLATION N/A 09/24/2022   Procedure: ENDOMETRIAL ABLATION;  Surgeon: Robin Better, MD;  Location: [facility];  Service: Gynecology;  Laterality: N/A;   HARDWARE REMOVAL Right 10/16/2014   Procedure: HARDWARE REMOVAL RIGHT ANKLE;  Surgeon: Robin Kos, MD;  Location: [facility];  Service:  Orthopedics;  Laterality: Right;   KNEE ARTHROSCOPY Right 09/01/2007   ORIF ANKLE FRACTURE BIMALLEOLAR Right 02/28/2007   ORIF TIBIA FRACTURE Right 02/28/2007   rod placement right   TIBIA HARDWARE REMOVAL Right 09/01/2007   There are no problems to display for this patient.   PCP: Jerl Mina MD  REFERRING PROVIDER: Gershon Mussel MD  REFERRING DIAG: [phone] (ICD-10-CM) - Pain in right ankle and joints of right foot   Rationale for Evaluation and Treatment: Rehabilitation  THERAPY DIAG: Pain in right ankle and joints of right foot  Muscle weakness (generalized)  ONSET DATE: Early 2024  FOLLOW-UP APPT SCHEDULED WITH REFERRING PROVIDER: No   From Initial Evaluation SUBJECTIVE:  SUBJECTIVE STATEMENT:  R ankle/foot pain  PERTINENT HISTORY:  Patient is a pleasant 42 year old female who was referred to physical therapy for right ankle pain. She underwent R ankle ORIF after "shattering my ankle" (trimalleolar fracture) in 2008 when she fell down stairs. She is now status post right tibial nail with subsequent hardware removal (lateral plate for fibula fracture removed) back in 2016. She still has the tibial IM nail in place. Pt was doing well until earlier this year when her R foot and ankle started to hurt. She denies any acute injury and states that the only thing she can think might have contributed to her pain was a change in her footwear. She went to [facility] where plain film radiographs showed no acute changes. She was given a home exercise program which did help however pain still remains so she was seen by Dr. Roda Long in [place] who referred her for physical therapy. She is now having intermittent discomfort to the R anterior/anteromedial ankle as well as pain in the distal lateral foot (ankle pain  started first followed by foot pain at later date). She has pain most days and symptoms are worse with walking as well as at the end of the day. She notes occasional anterior/anterolateral swelling in the ankle.    R ankle XR (03/01/2007) IMPRESSION:   Spiral type fracture involving the distal fibular shaft above the ankle mortise. There are also two fractures involving the tibia, one is in the mid-tibial shaft region and the other is distally.   XR Ankle Complete Right (01/05/2023) No acute fracture noted.  She does have mild degenerative changes throughout the ankle in addition to a healed fibula fracture.  No hardware complication of the tibial nail.  PAIN:    Pain Intensity: Present: 0/10, Best: 0/10, Worst: 8/10 Pain location: Anterior and anteromedial ankle "deep soreness", R foot pain over the distal 4th and 5th metatarsals (dull ache).  Pain Quality: dull and sore Radiating: No  Swelling: Yes, anterior/anteromedial ankle; Popping, catching, locking: Yes, occasional "pop" which offers pain relief; Numbness/Tingling: No Focal Weakness: Yes, some R ankle weakness reported Aggravating factors: extended standing/walking, exercising, painful walking after first waking in the morning; Relieving factors: pain medicine, rest (offloading), ice, compression stockings at night and sometimes during the day, no benefit with NSAIDs 24-hour pain behavior: Varies throughout the day. Bad especially first thing in the morning History of prior back, hip, knee, or ankle injury, pain, surgery, or therapy: Yes, history of R ankle surgery. R knee pain and swelling since her fall in 2008. She has also been having R low back pain since her injury. Her back pain occasionally radiates into the R hip but never into the knee or ankle;  Dominant hand: right Imaging: Yes, see history Typical footwear: pt has been wearing sneakers since her R foot/ankle pain started Red flags: Positive for weight gain, Negative for  personal history of cancer, chills/fever, night sweats, nausea, vomiting;  PRECAUTIONS: None  WEIGHT BEARING RESTRICTIONS: No  FALLS: Has patient fallen in last 6 months? No  Living Environment Lives with: lives with their daughter Lives in: House/apartment (two level townhome) Stairs: bedroom upstairs and pt has to occasionally [name] one step at a time due to the pain;  Prior level of function: Independent  Occupational demands: Gaffer at [employer]  Hobbies: exercising, eating different foods, spending time with friends  Patient Goals: Pt would like to be able to exercise and walk her dog without pain, Culley Hedeen  for a walk with her dog without pain    OBJECTIVE:   Patient Surveys  LEFS To be completed FOTO 47, predicted improvement to 53  Cognition Patient is oriented to person, place, and time.  Recent memory is intact.  Remote memory is intact.  Attention span and concentration are intact.  Expressive speech is intact.  Patient's fund of knowledge is within normal limits for educational level.    Gross Musculoskeletal Assessment Bulk: Normal Tone: Normal No trophic changes noted to foot/ankle. No ecchymosis, erythema, or edema noted. No gross ankle/foot deformity noted  GAIT: Full gait assessment deferred  Posture: No gross deficits in seated or standing posture which would contribute to her pain;  AROM AROM (Normal range in degrees) AROM   Hip Right Left  Flexion (125)    Extension (15)    Abduction (40)    Adduction     Internal Rotation (45)    External Rotation (45)        Knee    Flexion (135) WNL WNL  Extension (0) WNL WNL      Ankle    Dorsiflexion (20) 12 28  Plantarflexion (50) 50 42  Inversion (35) 18 30  Eversion (15) 8 18  (* = pain; Blank rows = not tested)   LE MMT: MMT (out of 5) Right  Left   Hip flexion 5 5  Hip extension    Hip abduction    Hip adduction    Hip internal rotation    Hip external rotation     Knee flexion 5 5  Knee extension 5 5  Ankle dorsiflexion 4* 5  Ankle plantarflexion 5 5  Ankle inversion 5 5  Ankle eversion 5 5  (* = pain; Blank rows = not tested)  Sensation Grossly intact to light touch throughout bilateral LEs as determined by testing dermatomes L2-S2. Proprioception, stereognosis, and hot/cold testing deferred on this date.  Reflexes Deferred  Muscle Length Deferred  Palpation Pain with palpation to anteromedial ankle near the scar from her surgery. Pain with palpation over tibialis anterior distal muscle belly and tendon. Pain to posterior tibialis tendon but no pain at navicular. Painful at base of 5th metatarsal and in space between 3rd and 4th/4th and 5th metatarsals.  Passive Accessory Motion Deferred  VASCULAR Deferred  SPECIAL TESTS Ligamentous Integrity Anterior Drawer (ATF, 10-15 plantarflexion with anterior translation): Negative Talar Tilt (CFL, inversion): Negative Eversion Stress Test (Deltoid, eversion): Negative External Rotation Test (High ankle, dorsiflexion and external rotation): Negative Squeeze Test (High ankle): Negative Impingment Sign (Dorsiflexion and eversion): Negative  Achilles Integrity Thompson Test: Negative  Fracture Screening Metatarsal Axial Loading: Negative Tap/Percussion Test: Not examined Vibration Test: Not examined  Pronation/Supination Navicular Drop: Not examined  Nerve Test Tarsal Tunnel Test (maximal DF, EV, toe ext with tapping over tarsal tunnel): Not examined Test for Morton's Neuroma (compress metatarsals and mobilize): Positive  Other Windlass Mechanism Test: Negative   Beighton Scale Deferred   TODAY'S TREATMENT  SUBJECTIVE: Patient arrived to physical therapy with improved symptoms at the ankle. Followed up with podiatrist and received a steroid injection, steroid taper, and ankle brace. She reports that the brace is causing some ankle pain.  No further questions or  concerns.  PAIN: 5/10 pain at rest  Ther-ex  Dorsiflexion, Eversion, Plantarflexion, Inversion 2 x 10 Red band & Green Band;  Airex Pad BLE Calf Raises with Ball inbetween Ankle 2 x 10, no medial ankle pain reported;  Airex Beam, Tandem walk with hand  hover support x multiple length  Airex Single leg Stance BLE 2 x 30s BLE Ankle Drop Calf Stretch and Calf Raise 1 x 10; BLE Ankle Drop and Calf Raise with ball squeeze 2 x 10; Lunge with Front LE on dynadisc BLE 1 x 10; Single Leg Stance with Cone Taps x multiple bouts;  HEP modified (provided green band) and reviewed with patient;   Not performed Airex Pad BLE Calf Raises at Steps 2 x10, no pain reported; Banded Eversion, Green TB 2 x 10;  BLE Feet on Green and Pink balance pad, no UE support, EC 3 x 30s; R Ankle Dorsiflexion, Plantarflexion, Inversion, Eversion Isometric 2 x 10 x 10s; R ankle A/P mobilizations at available end range dorsiflexion, grade I-II, 30s/bout x 2 bouts; R ankle talocrural distraction, grade I-II, 30s/bout x 3 bouts; Extensive STM to medial, anterior, and lateral ankle as well as proximally along anterior tibialis muscle; Wall Supported Toe Raises 2 x 10 x 2s hold;   PATIENT EDUCATION:  Education details: Pt educated throughout session about proper posture and technique with exercises. Educated patient on self STM at Tibialis anterior for pain and stiffness Improved exercise technique, movement at target joints, use of target muscles after min to mod verbal, visual, tactile cues.  Person educated: Patient Education method: Medical illustrator Education comprehension: verbalized and demonstrated understanding   HOME EXERCISE PROGRAM:  Access Code: [email or web address] URL: [email or web address] Date: 02/17/2023 Prepared by: Ria Comment  Exercises - Seated Figure 4 Ankle Inversion with Resistance  - 1 x daily - 7 x weekly - 1-2 sets - 10 reps - 2-3s hold - Seated Ankle Eversion with  Resistance (Mirrored)  - 1 x daily - 7 x weekly - 1-2 sets - 10 reps - 2-3s hold - Toe Raise With Back Against Wall  - 1 x daily - 7 x weekly - 1-2 sets - 10 reps - 2-3s hold (On Pause)  - Standing Bilateral Gastroc Stretch with Step  - 1 x daily - 7 x weekly - 3 reps - 30-45s hold   ASSESSMENT:  CLINICAL IMPRESSION: Patient arrived to physical therapy highly motivated. Pt reported that physician suggested that pain near the  4th and 5th metatarsal possibly due to stress fx. PT recommended to limit prolonged weight bearing activities. Progressed lower leg strength with additional single leg exercises to improve strength and stability. Patient tolerated increased activity until end of session with report of tightness in the dorsal aspect of the foot. PT recommended pt to bring ankle brace in next appointment to assess proper fitting and activity tolerance with brace wear. PT will continue to progress LE strength and stability training as tolerated. Patient will continue benefit from PT services to address deficits in strength, range of motion, and pain in order to return to full function at home, work, and with leisure activities.  OBJECTIVE IMPAIRMENTS: difficulty walking, decreased strength, and pain.   ACTIVITY LIMITATIONS: standing and stairs  PARTICIPATION LIMITATIONS: shopping, community activity, occupation, and exercise  PERSONAL FACTORS: Past/current experiences and 1-2 comorbidities: anxiety and R ankle arthritis  are also affecting patient's functional outcome.   REHAB POTENTIAL: Good  CLINICAL DECISION MAKING: Evolving/moderate complexity  EVALUATION COMPLEXITY: Moderate   GOALS: Goals reviewed with patient? No  SHORT TERM GOALS: Target date: 03/01/2023  Pt will be independent with HEP to improve strength and decrease ankle pain to improve pain-free function at home and work. Baseline:  Goal status: INITIAL   LONG TERM GOALS: Target date: 03/29/2023  Pt will increase  FOTO to at least 67 to demonstrate significant improvement in function at home and work related to ankle pain  Baseline: 02/01/23: 47; Goal status: INITIAL  2.  Pt will decrease worst ankle pain by at least 3 points on the NPRS in order to demonstrate clinically significant reduction in ankle pain. Baseline: 02/01/23: worst: 8/10; Goal status: INITIAL  3.  Pt will decrease LEFS score by at least 9 points in order demonstrate clinically significant reduction in ankle pain/disability.       Baseline: 02/01/23: To be completed Goal status: INITIAL  4.  Pt will increase pain-free strength of R ankle dorsiflexion to 5/5 MMT grade in order to demonstrate improvement in strength and function  Baseline: 02/01/23: 4/5, painful Goal status: INITIAL   PLAN: PT FREQUENCY: 1-2x/week  PT DURATION: 8 weeks  PLANNED INTERVENTIONS: Therapeutic exercises, Therapeutic activity, Neuromuscular re-education, Balance training, Gait training, Patient/Family education, Self Care, Joint mobilization, Joint manipulation, Vestibular training, Canalith repositioning, Orthotic/Fit training, DME instructions, Dry Needling, Electrical stimulation, Spinal manipulation, Spinal mobilization, Cryotherapy, Moist heat, Taping, Traction, Ultrasound, Ionotophoresis 4mg /ml Dexamethasone, Manual therapy, and Re-evaluation.  PLAN FOR NEXT SESSION: Progress strengthening and manual techniques, TDN to anterior tibialis, modify/review HEP as needed;   Cristal Deer Aven Cegielski SPT Barbara Cower D Huprich PT, DPT, GCS

## 2023-03-08 NOTE — Therapy (Cosign Needed Addendum)
OUTPATIENT PHYSICAL THERAPY ANKLE/FOOT TREATMENT  Patient Name: [name] MRN: [id number] DOB:09-29-1980, 42 y.o., female Today's Date: 03/10/2023  END OF SESSION:  PT End of Session - 03/10/23 0829     Visit Number 9    Number of Visits 17    Date for PT Re-Evaluation 03/29/23    Authorization Type eval: 02/01/23    PT Start Time 1650    PT Stop Time 1745    PT Time Calculation (min) 55 min    Equipment Utilized During Treatment Other (comment)   BioSkin Trilock Ankle Brace   Activity Tolerance Patient tolerated treatment well    Behavior During Therapy WFL for tasks assessed/performed            Past Medical History:  Diagnosis Date   Anxiety    Arthritis    right ankle   Bony exostosis 09/2014   right knee   GERD (gastroesophageal reflux disease)    History of migraine    Hypertension    Neuromuscular disorder (HCC)    trigeminal nerve pain   Pain, eye, left 10/10/2014   with tingling - states possible shingles; to see PCP   Painful orthopaedic hardware (HCC) 09/2014   right ankle   Past Surgical History:  Procedure Laterality Date   BONE EXOSTOSIS EXCISION Right 10/16/2014   Procedure: EXCISION OF BONY EXOSTOSIS FROM RIGHT KNEE;  Surgeon: Robin Kos, MD;  Location: [facility];  Service: Orthopedics;  Laterality: Right;   CHOLECYSTECTOMY  07/12/2006   DILATATION & CURRETTAGE/HYSTEROSCOPY WITH RESECTOCOPE N/A 09/24/2022   Procedure: DILATATION & CURETTAGE/HYSTEROSCOPY WITH RESECTOCOPE;  Surgeon: Robin Better, MD;  Location: [facility];  Service: Gynecology;  Laterality: N/A;   ENDOMETRIAL ABLATION N/A 09/24/2022   Procedure: ENDOMETRIAL ABLATION;  Surgeon: Robin Better, MD;  Location: [facility];  Service: Gynecology;  Laterality: N/A;   HARDWARE REMOVAL Right 10/16/2014   Procedure: HARDWARE REMOVAL RIGHT ANKLE;  Surgeon: Robin Kos, MD;  Location: [facility];  Service:  Orthopedics;  Laterality: Right;   KNEE ARTHROSCOPY Right 09/01/2007   ORIF ANKLE FRACTURE BIMALLEOLAR Right 02/28/2007   ORIF TIBIA FRACTURE Right 02/28/2007   rod placement right   TIBIA HARDWARE REMOVAL Right 09/01/2007   There are no problems to display for this patient.   PCP: Robin Mina MD  REFERRING PROVIDER: Gershon Mussel MD  REFERRING DIAG: [phone] (ICD-10-CM) - Pain in right ankle and joints of right foot   Rationale for Evaluation and Treatment: Rehabilitation  THERAPY DIAG: Pain in right ankle and joints of right foot  Muscle weakness (generalized)  ONSET DATE: Early 2024  FOLLOW-UP APPT SCHEDULED WITH REFERRING PROVIDER: No   From Initial Evaluation SUBJECTIVE:  SUBJECTIVE STATEMENT:  R ankle/foot pain  PERTINENT HISTORY:  Patient is a pleasant 42 year old female who was referred to physical therapy for right ankle pain. She underwent R ankle ORIF after "shattering my ankle" (trimalleolar fracture) in 2008 when she fell down stairs. She is now status post right tibial nail with subsequent hardware removal (lateral plate for fibula fracture removed) back in 2016. She still has the tibial IM nail in place. Pt was doing well until earlier this year when her R foot and ankle started to hurt. She denies any acute injury and states that the only thing she can think might have contributed to her pain was a change in her footwear. She went to [facility] where plain film radiographs showed no acute changes. She was given a home exercise program which did help however pain still remains so she was seen by Dr. Roda Long in [place] who referred her for physical therapy. She is now having intermittent discomfort to the R anterior/anteromedial ankle as well as pain in the distal lateral foot (ankle pain  started first followed by foot pain at later date). She has pain most days and symptoms are worse with walking as well as at the end of the day. She notes occasional anterior/anterolateral swelling in the ankle.    R ankle XR (03/01/2007) IMPRESSION:   Spiral type fracture involving the distal fibular shaft above the ankle mortise. There are also two fractures involving the tibia, one is in the mid-tibial shaft region and the other is distally.   XR Ankle Complete Right (01/05/2023) No acute fracture noted.  She does have mild degenerative changes throughout the ankle in addition to a healed fibula fracture.  No hardware complication of the tibial nail.  PAIN:    Pain Intensity: Present: 0/10, Best: 0/10, Worst: 8/10 Pain location: Anterior and anteromedial ankle "deep soreness", R foot pain over the distal 4th and 5th metatarsals (dull ache).  Pain Quality: dull and sore Radiating: No  Swelling: Yes, anterior/anteromedial ankle; Popping, catching, locking: Yes, occasional "pop" which offers pain relief; Numbness/Tingling: No Focal Weakness: Yes, some R ankle weakness reported Aggravating factors: extended standing/walking, exercising, painful walking after first waking in the morning; Relieving factors: pain medicine, rest (offloading), ice, compression stockings at night and sometimes during the day, no benefit with NSAIDs 24-hour pain behavior: Varies throughout the day. Bad especially first thing in the morning History of prior back, hip, knee, or ankle injury, pain, surgery, or therapy: Yes, history of R ankle surgery. R knee pain and swelling since her fall in 2008. She has also been having R low back pain since her injury. Her back pain occasionally radiates into the R hip but never into the knee or ankle;  Dominant hand: right Imaging: Yes, see history Typical footwear: pt has been wearing sneakers since her R foot/ankle pain started Red flags: Positive for weight gain, Negative for  personal history of cancer, chills/fever, night sweats, nausea, vomiting;  PRECAUTIONS: None  WEIGHT BEARING RESTRICTIONS: No  FALLS: Has patient fallen in last 6 months? No  Living Environment Lives with: lives with their daughter Lives in: House/apartment (two level townhome) Stairs: bedroom upstairs and pt has to occasionally [name] one step at a time due to the pain;  Prior level of function: Independent  Occupational demands: Gaffer at [employer]  Hobbies: exercising, eating different foods, spending time with friends  Patient Goals: Pt would like to be able to exercise and walk her dog without pain, Gilberto Streck  for a walk with her dog without pain    OBJECTIVE:   Patient Surveys  LEFS To be completed FOTO 47, predicted improvement to 55  Cognition Patient is oriented to person, place, and time.  Recent memory is intact.  Remote memory is intact.  Attention span and concentration are intact.  Expressive speech is intact.  Patient's fund of knowledge is within normal limits for educational level.    Gross Musculoskeletal Assessment Bulk: Normal Tone: Normal No trophic changes noted to foot/ankle. No ecchymosis, erythema, or edema noted. No gross ankle/foot deformity noted  GAIT: Full gait assessment deferred  Posture: No gross deficits in seated or standing posture which would contribute to her pain;  AROM AROM (Normal range in degrees) AROM   Hip Right Left  Flexion (125)    Extension (15)    Abduction (40)    Adduction     Internal Rotation (45)    External Rotation (45)        Knee    Flexion (135) WNL WNL  Extension (0) WNL WNL      Ankle    Dorsiflexion (20) 12 28  Plantarflexion (50) 50 42  Inversion (35) 18 30  Eversion (15) 8 18  (* = pain; Blank rows = not tested)   LE MMT: MMT (out of 5) Right  Left   Hip flexion 5 5  Hip extension    Hip abduction    Hip adduction    Hip internal rotation    Hip external rotation     Knee flexion 5 5  Knee extension 5 5  Ankle dorsiflexion 4* 5  Ankle plantarflexion 5 5  Ankle inversion 5 5  Ankle eversion 5 5  (* = pain; Blank rows = not tested)  Sensation Grossly intact to light touch throughout bilateral LEs as determined by testing dermatomes L2-S2. Proprioception, stereognosis, and hot/cold testing deferred on this date.  Reflexes Deferred  Muscle Length Deferred  Palpation Pain with palpation to anteromedial ankle near the scar from her surgery. Pain with palpation over tibialis anterior distal muscle belly and tendon. Pain to posterior tibialis tendon but no pain at navicular. Painful at base of 5th metatarsal and in space between 3rd and 4th/4th and 5th metatarsals.  Passive Accessory Motion Deferred  VASCULAR Deferred  SPECIAL TESTS Ligamentous Integrity Anterior Drawer (ATF, 10-15 plantarflexion with anterior translation): Negative Talar Tilt (CFL, inversion): Negative Eversion Stress Test (Deltoid, eversion): Negative External Rotation Test (High ankle, dorsiflexion and external rotation): Negative Squeeze Test (High ankle): Negative Impingment Sign (Dorsiflexion and eversion): Negative  Achilles Integrity Thompson Test: Negative  Fracture Screening Metatarsal Axial Loading: Negative Tap/Percussion Test: Not examined Vibration Test: Not examined  Pronation/Supination Navicular Drop: Not examined  Nerve Test Tarsal Tunnel Test (maximal DF, EV, toe ext with tapping over tarsal tunnel): Not examined Test for Morton's Neuroma (compress metatarsals and mobilize): Positive  Other Windlass Mechanism Test: Negative   Beighton Scale Deferred   TODAY'S TREATMENT   SUBJECTIVE: Patient reported to physical therapy with minor pain in the right ankle. Patient arrived with Bioskin Trifit ankle brace for assessment of fitting and wear with activity. She reports "brace doesn't feel fitted properly."   PAIN: 4/10 pain at rest, bottom of  the foot and anterior ankle.    Ther-ex  PT applied BioSkin Trifit Ankle Brace; educated on proper fit for improved stability.  Airex Pad BLE Calf Raises with Ball in between Ankles 2 x 10;  Airex Single Leg Stance BLE  2 x 30s each LE; Airex Beam, Lateral and Cross over step with hand hover support x 2 each direction Single Leg Stance with Lateral Resistance 3 x 10s each LE (PT added perturbations with right SLS);    Manual Therapy: Extensive STM to Medial Soleus, Posterior Tibialis, Tibialis Anterior; used myofacial release, effleurage and trigger point release techniques   Trigger Point Dry Needling (TDN), unbilled Education previously performed with patient regarding potential benefit of TDN. Previously reviewed precautions and risks with patient. Pt provided verbal consent to treatment. With pt in supine using clean technique TDN performed to R posterior tibialis with 2, 0.25 x 40 single needle placements with local twitch response (LTR) during all placements. Pistoning technique utilized for all placements.   Not performed Airex Pad BLE Calf Raises at Steps 2 x10, no pain reported; Banded Eversion, Green TB 2 x 10;  BLE Feet on Green and Pink balance pad, no UE support, EC 3 x 30s; R Ankle Dorsiflexion, Plantarflexion, Inversion, Eversion Isometric 2 x 10 x 10s; R ankle A/P mobilizations at available end range dorsiflexion, grade I-II, 30s/bout x 2 bouts; R ankle talocrural distraction, grade I-II, 30s/bout x 3 bouts; Extensive STM to medial, anterior, and lateral ankle as well as proximally along anterior tibialis muscle; Wall Supported Toe Raises 2 x 10 x 2s hold;   PATIENT EDUCATION:  Education details: Pt educated throughout session about proper posture and technique with exercises. Educated patient on self STM at Tibialis anterior for pain and stiffness Improved exercise technique, movement at target joints, use of target muscles after min to mod verbal, visual, tactile cues.   Person educated: Patient Education method: Medical illustrator Education comprehension: verbalized and demonstrated understanding   HOME EXERCISE PROGRAM:  Access Code: [email or web address] URL: [email or web address] Date: 02/17/2023 Prepared by: Ria Comment  Exercises - Seated Figure 4 Ankle Inversion with Resistance  - 1 x daily - 7 x weekly - 1-2 sets - 10 reps - 2-3s hold - Seated Ankle Eversion with Resistance (Mirrored)  - 1 x daily - 7 x weekly - 1-2 sets - 10 reps - 2-3s hold - Toe Raise With Back Against Wall  - 1 x daily - 7 x weekly - 1-2 sets - 10 reps - 2-3s hold (On Pause)  - Standing Bilateral Gastroc Stretch with Step  - 1 x daily - 7 x weekly - 3 reps - 30-45s hold   ASSESSMENT:  CLINICAL IMPRESSION: Patient arrived to physical therapy highly motivated. Progressed ankle stability and strengthening training. Patient tolerated single leg stance with external pertubation without reports of additional report of pain. PT performed STM and dry needling to anterior and medial ankle to mitigate muscle tension and pain. PT applied ankle brace prior to exercise, educated on proper fit to improve stability. Pt demonstrates good ability to perform single leg balance in order to improve ankle joint proprioception and extrinsic muscles. She continues to present improvements with ankle stability and strength. No updates to HEP during this visit. PT will continue to progress LE strength and stability training as tolerated. Patient will continue benefit from PT services to address deficits in strength, range of motion, and pain in order to return to full function at home, work, and with leisure activities.  OBJECTIVE IMPAIRMENTS: difficulty walking, decreased strength, and pain.   ACTIVITY LIMITATIONS: standing and stairs  PARTICIPATION LIMITATIONS: shopping, community activity, occupation, and exercise  PERSONAL FACTORS: Past/current experiences and 1-2 comorbidities:  anxiety and R ankle arthritis  are also affecting  patient's functional outcome.   REHAB POTENTIAL: Good  CLINICAL DECISION MAKING: Evolving/moderate complexity  EVALUATION COMPLEXITY: Moderate   GOALS: Goals reviewed with patient? No  SHORT TERM GOALS: Target date: 03/01/2023  Pt will be independent with HEP to improve strength and decrease ankle pain to improve pain-free function at home and work. Baseline:  Goal status: INITIAL   LONG TERM GOALS: Target date: 03/29/2023  Pt will increase FOTO to at least 67 to demonstrate significant improvement in function at home and work related to ankle pain  Baseline: 02/01/23: 47; Goal status: INITIAL  2.  Pt will decrease worst ankle pain by at least 3 points on the NPRS in order to demonstrate clinically significant reduction in ankle pain. Baseline: 02/01/23: worst: 8/10; Goal status: INITIAL  3.  Pt will decrease LEFS score by at least 9 points in order demonstrate clinically significant reduction in ankle pain/disability.       Baseline: 02/01/23: To be completed Goal status: INITIAL  4.  Pt will increase pain-free strength of R ankle dorsiflexion to 5/5 MMT grade in order to demonstrate improvement in strength and function  Baseline: 02/01/23: 4/5, painful Goal status: INITIAL   PLAN: PT FREQUENCY: 1-2x/week  PT DURATION: 8 weeks  PLANNED INTERVENTIONS: Therapeutic exercises, Therapeutic activity, Neuromuscular re-education, Balance training, Gait training, Patient/Family education, Self Care, Joint mobilization, Joint manipulation, Vestibular training, Canalith repositioning, Orthotic/Fit training, DME instructions, Dry Needling, Electrical stimulation, Spinal manipulation, Spinal mobilization, Cryotherapy, Moist heat, Taping, Traction, Ultrasound, Ionotophoresis 4mg /ml Dexamethasone, Manual therapy, and Re-evaluation.  PLAN FOR NEXT SESSION: Progress strengthening and manual techniques, TDN to anterior tibialis, modify/review  HEP as needed;   Cristal Deer Bellamy Rubey SPT Barbara Cower D Huprich PT, DPT, GCS

## 2023-03-08 NOTE — Telephone Encounter (Signed)
Pt calling again, in a lot of pain and wondering if there is anything she could do to help relieve the pain .

## 2023-03-10 ENCOUNTER — Ambulatory Visit: Payer: BC Managed Care – PPO

## 2023-03-10 DIAGNOSIS — M6281 Muscle weakness (generalized): Secondary | ICD-10-CM

## 2023-03-10 DIAGNOSIS — M25571 Pain in right ankle and joints of right foot: Secondary | ICD-10-CM | POA: Diagnosis not present

## 2023-03-10 NOTE — Therapy (Addendum)
OUTPATIENT PHYSICAL THERAPY ANKLE/FOOT TREATMENT/PROGRESS NOTE  Dates of reporting period  03/29/2023   to   03/10/2023   Patient Name: [name] MRN: [id number] DOB:1980/12/04, 42 y.o., female Today's Date: 03/12/2023  END OF SESSION:  PT End of Session - 03/12/23 1512     Visit Number 10    Number of Visits 17    Date for PT Re-Evaluation 03/29/23    Authorization Type eval: 02/01/23    PT Start Time 1705    PT Stop Time 1745    PT Time Calculation (min) 40 min    Equipment Utilized During Treatment Other (comment)   BioSkin Trilock Ankle Brace   Activity Tolerance Patient tolerated treatment well    Behavior During Therapy WFL for tasks assessed/performed            Past Medical History:  Diagnosis Date   Anxiety    Arthritis    right ankle   Bony exostosis 09/2014   right knee   GERD (gastroesophageal reflux disease)    History of migraine    Hypertension    Neuromuscular disorder (HCC)    trigeminal nerve pain   Pain, eye, left 10/10/2014   with tingling - states possible shingles; to see PCP   Painful orthopaedic hardware (HCC) 09/2014   right ankle   Past Surgical History:  Procedure Laterality Date   BONE EXOSTOSIS EXCISION Right 10/16/2014   Procedure: EXCISION OF BONY EXOSTOSIS FROM RIGHT KNEE;  Surgeon: Tarry Kos, MD;  Location: [facility];  Service: Orthopedics;  Laterality: Right;   CHOLECYSTECTOMY  07/12/2006   DILATATION & CURRETTAGE/HYSTEROSCOPY WITH RESECTOCOPE N/A 09/24/2022   Procedure: DILATATION & CURETTAGE/HYSTEROSCOPY WITH RESECTOCOPE;  Surgeon: Maxie Better, MD;  Location: [facility];  Service: Gynecology;  Laterality: N/A;   ENDOMETRIAL ABLATION N/A 09/24/2022   Procedure: ENDOMETRIAL ABLATION;  Surgeon: Maxie Better, MD;  Location: [facility] [place];  Service: Gynecology;  Laterality: N/A;   HARDWARE REMOVAL Right 10/16/2014   Procedure: HARDWARE REMOVAL RIGHT ANKLE;  Surgeon:  Tarry Kos, MD;  Location: [facility];  Service: Orthopedics;  Laterality: Right;   KNEE ARTHROSCOPY Right 09/01/2007   ORIF ANKLE FRACTURE BIMALLEOLAR Right 02/28/2007   ORIF TIBIA FRACTURE Right 02/28/2007   rod placement right   TIBIA HARDWARE REMOVAL Right 09/01/2007   There are no problems to display for this patient.   PCP: Jerl Mina MD  REFERRING PROVIDER: Gershon Mussel MD  REFERRING DIAG: [phone] (ICD-10-CM) - Pain in right ankle and joints of right foot   Rationale for Evaluation and Treatment: Rehabilitation  THERAPY DIAG: Pain in right ankle and joints of right foot  Muscle weakness (generalized)  ONSET DATE: Early 2024  FOLLOW-UP APPT SCHEDULED WITH REFERRING PROVIDER: No   From Initial Evaluation SUBJECTIVE:  SUBJECTIVE STATEMENT:  R ankle/foot pain  PERTINENT HISTORY:  Patient is a pleasant 42 year old female who was referred to physical therapy for right ankle pain. She underwent R ankle ORIF after "shattering my ankle" (trimalleolar fracture) in 2008 when she fell down stairs. She is now status post right tibial nail with subsequent hardware removal (lateral plate for fibula fracture removed) back in 2016. She still has the tibial IM nail in place. Pt was doing well until earlier this year when her R foot and ankle started to hurt. She denies any acute injury and states that the only thing she can think might have contributed to her pain was a change in her footwear. She went to [facility] where plain film radiographs showed no acute changes. She was given a home exercise program which did help however pain still remains so she was seen by Dr. Roda Shutters in [place] who referred her for physical therapy. She is now having intermittent discomfort to the R  anterior/anteromedial ankle as well as pain in the distal lateral foot (ankle pain started first followed by foot pain at later date). She has pain most days and symptoms are worse with walking as well as at the end of the day. She notes occasional anterior/anterolateral swelling in the ankle.    R ankle XR (03/01/2007) IMPRESSION:   Spiral type fracture involving the distal fibular shaft above the ankle mortise. There are also two fractures involving the tibia, one is in the mid-tibial shaft region and the other is distally.   XR Ankle Complete Right (01/05/2023) No acute fracture noted.  She does have mild degenerative changes throughout the ankle in addition to a healed fibula fracture.  No hardware complication of the tibial nail.  PAIN:    Pain Intensity: Present: 0/10, Best: 0/10, Worst: 8/10 Pain location: Anterior and anteromedial ankle "deep soreness", R foot pain over the distal 4th and 5th metatarsals (dull ache).  Pain Quality: dull and sore Radiating: No  Swelling: Yes, anterior/anteromedial ankle; Popping, catching, locking: Yes, occasional "pop" which offers pain relief; Numbness/Tingling: No Focal Weakness: Yes, some R ankle weakness reported Aggravating factors: extended standing/walking, exercising, painful walking after first waking in the morning; Relieving factors: pain medicine, rest (offloading), ice, compression stockings at night and sometimes during the day, no benefit with NSAIDs 24-hour pain behavior: Varies throughout the day. Bad especially first thing in the morning History of prior back, hip, knee, or ankle injury, pain, surgery, or therapy: Yes, history of R ankle surgery. R knee pain and swelling since her fall in 2008. She has also been having R low back pain since her injury. Her back pain occasionally radiates into the R hip but never into the knee or ankle;  Dominant hand: right Imaging: Yes, see history Typical footwear: pt has been wearing sneakers since  her R foot/ankle pain started Red flags: Positive for weight gain, Negative for personal history of cancer, chills/fever, night sweats, nausea, vomiting;  PRECAUTIONS: None  WEIGHT BEARING RESTRICTIONS: No  FALLS: Has patient fallen in last 6 months? No  Living Environment Lives with: lives with their daughter Lives in: House/apartment (two level townhome) Stairs: bedroom upstairs and pt has to occasionally Yazen Rosko one step at a time due to the pain;  Prior level of function: Independent  Occupational demands: Gaffer at [employer]  Hobbies: exercising, eating different foods, spending time with friends  Patient Goals: Pt would like to be able to exercise and walk her dog without pain, Priscella Donna  for a walk with her dog without pain    OBJECTIVE:   Patient Surveys  LEFS To be completed FOTO 47, predicted improvement to 26  Cognition Patient is oriented to person, place, and time.  Recent memory is intact.  Remote memory is intact.  Attention span and concentration are intact.  Expressive speech is intact.  Patient's fund of knowledge is within normal limits for educational level.    Gross Musculoskeletal Assessment Bulk: Normal Tone: Normal No trophic changes noted to foot/ankle. No ecchymosis, erythema, or edema noted. No gross ankle/foot deformity noted  GAIT: Full gait assessment deferred  Posture: No gross deficits in seated or standing posture which would contribute to her pain;  AROM AROM (Normal range in degrees) AROM   Hip Right Left  Flexion (125)    Extension (15)    Abduction (40)    Adduction     Internal Rotation (45)    External Rotation (45)        Knee    Flexion (135) WNL WNL  Extension (0) WNL WNL      Ankle    Dorsiflexion (20) 12 28  Plantarflexion (50) 50 42  Inversion (35) 18 30  Eversion (15) 8 18  (* = pain; Blank rows = not tested)   LE MMT: MMT (out of 5) Right  Left   Hip flexion 5 5  Hip extension    Hip  abduction    Hip adduction    Hip internal rotation    Hip external rotation    Knee flexion 5 5  Knee extension 5 5  Ankle dorsiflexion 4* 5  Ankle plantarflexion 5 5  Ankle inversion 5 5  Ankle eversion 5 5  (* = pain; Blank rows = not tested)  Sensation Grossly intact to light touch throughout bilateral LEs as determined by testing dermatomes L2-S2. Proprioception, stereognosis, and hot/cold testing deferred on this date.  Reflexes Deferred  Muscle Length Deferred  Palpation Pain with palpation to anteromedial ankle near the scar from her surgery. Pain with palpation over tibialis anterior distal muscle belly and tendon. Pain to posterior tibialis tendon but no pain at navicular. Painful at base of 5th metatarsal and in space between 3rd and 4th/4th and 5th metatarsals.  Passive Accessory Motion Deferred  VASCULAR Deferred  SPECIAL TESTS Ligamentous Integrity Anterior Drawer (ATF, 10-15 plantarflexion with anterior translation): Negative Talar Tilt (CFL, inversion): Negative Eversion Stress Test (Deltoid, eversion): Negative External Rotation Test (High ankle, dorsiflexion and external rotation): Negative Squeeze Test (High ankle): Negative Impingment Sign (Dorsiflexion and eversion): Negative  Achilles Integrity Thompson Test: Negative  Fracture Screening Metatarsal Axial Loading: Negative Tap/Percussion Test: Not examined Vibration Test: Not examined  Pronation/Supination Navicular Drop: Not examined  Nerve Test Tarsal Tunnel Test (maximal DF, EV, toe ext with tapping over tarsal tunnel): Not examined Test for Morton's Neuroma (compress metatarsals and mobilize): Positive  Other Windlass Mechanism Test: Negative   Beighton Scale Deferred   TODAY'S TREATMENT   SUBJECTIVE: Patient reported to physical therapy feeling a bit sore in the R Ankle following last session. Localized tenderness along the posterior tibialis muscle and distal medial tibia. No  further questions or concerns.    PAIN: 5/10 pain at rest,anterior ankle to distal toe.    Ther-ex: PT applied BioSkin Trifit Ankle Brace; Airex Pad 12" Step Tap 1 x 20, 1 x 10 Alternating BLE;  Airex Pad SLS Balance 10 x 3s holds x BLE with manual perturbations Airex Beam with Tandem Walk  Forward and Backwards in // bars x3 Airex Beam with Tandem Walk Forward and Backwards in // bars against black theratube x 2 Long Sitting Ankle Dorsiflexion, Inversion, Eversion 2 x 10 reps x 5s hold with green theraband (RLE)  Long Sitting Ankle Dorsiflexion, Inversion, Eversion 1 x 10 reps x 5s hold with blue theraband (LLE);   Patient Outcomes Measured and updated:  NRPS Worst Pain: 8/10 MMT Dorsiflexion RLE: 5/5, Painless  FOTO: 37   Not performed Airex Pad BLE Calf Raises at Steps 2 x10, no pain reported; Banded Eversion, Green TB 2 x 10;  BLE Feet on Green and Pink balance pad, no UE support, EC 3 x 30s; R Ankle Dorsiflexion, Plantarflexion, Inversion, Eversion Isometric 2 x 10 x 10s; R ankle A/P mobilizations at available end range dorsiflexion, grade I-II, 30s/bout x 2 bouts; R ankle talocrural distraction, grade I-II, 30s/bout x 3 bouts; Extensive STM to medial, anterior, and lateral ankle as well as proximally along anterior tibialis muscle; Wall Supported Toe Raises 2 x 10 x 2s hold;   PATIENT EDUCATION:  Education details: Pt educated throughout session about proper posture and technique with exercises. Educated patient on self STM at Tibialis anterior for pain and stiffness Improved exercise technique, movement at target joints, use of target muscles after min to mod verbal, visual, tactile cues.  Person educated: Patient Education method: Medical illustrator Education comprehension: verbalized and demonstrated understanding   HOME EXERCISE PROGRAM:  Access Code: [email or web address] URL: [email or web address] Date: 02/17/2023 Prepared by: Ria Comment  Exercises - Seated Figure 4 Ankle Inversion with Resistance  - 1 x daily - 7 x weekly - 1-2 sets - 10 reps - 2-3s hold - Seated Ankle Eversion with Resistance (Mirrored)  - 1 x daily - 7 x weekly - 1-2 sets - 10 reps - 2-3s hold - Toe Raise With Back Against Wall  - 1 x daily - 7 x weekly - 1-2 sets - 10 reps - 2-3s hold (On Pause)  - Standing Bilateral Gastroc Stretch with Step  - 1 x daily - 7 x weekly - 3 reps - 30-45s hold   ASSESSMENT:  CLINICAL IMPRESSION: Patient arrived to physical therapy with no new reports. Today session with a main focus on reassessment of goals and continued ankle stability training. Patient demonstrated 5/5 strength in dorsiflexion on the RLE without pain. Her worst pain has remained unchanged; self reported 8/10 on NRPS within the last 7 days. Patient self reported 23 on FOTO; a regression from the previous score 47 indicating a decrease in functional tasks due to pain. She continues to be limited due to pain in her forefoot. PT discussed alternatives to current brace as pt endorsed the size doesn't provide enough stability with functional tasks and prolonged walking. PT will continue to progress LE strength and stability training as tolerated. Updated HEP to include non-weight bearing exercises in order to reduce pain at forefoot in RLE. Patient will continue benefit from PT services to address deficits in strength, range of motion, and pain in order to return to full function at home, work, and with leisure activities.  OBJECTIVE IMPAIRMENTS: difficulty walking, decreased strength, and pain.   ACTIVITY LIMITATIONS: standing and stairs  PARTICIPATION LIMITATIONS: shopping, community activity, occupation, and exercise  PERSONAL FACTORS: Past/current experiences and 1-2 comorbidities: anxiety and R ankle arthritis  are also affecting patient's functional outcome.   REHAB POTENTIAL: Good  CLINICAL DECISION MAKING: Evolving/moderate  complexity  EVALUATION COMPLEXITY: Moderate   GOALS: Goals  reviewed with patient? No  SHORT TERM GOALS: Target date: 03/01/2023  Pt will be independent with HEP to improve strength and decrease ankle pain to improve pain-free function at home and work. Baseline:  Goal status: PROGRESSING   LONG TERM GOALS: Target date: 03/29/2023  Pt will increase FOTO to at least 67 to demonstrate significant improvement in function at home and work related to ankle pain  Baseline: 02/01/23: 47; 03/10/2023: 37  Goal status: ONGOING  2.  Pt will decrease worst ankle pain by at least 3 points on the NPRS in order to demonstrate clinically significant reduction in ankle pain. Baseline: 02/01/23: worst: 8/10; 03/10/2023: Worst 8/10, Goal status: ONGOING  3.  Pt will increase pain-free strength of R ankle dorsiflexion to 5/5 MMT grade in order to demonstrate improvement in strength and function  Baseline: 02/01/23: 4/5, painful, 03/10/2023: 5/5, Painless  Goal status: GOAL MET    PLAN: PT FREQUENCY: 1-2x/week  PT DURATION: 8 weeks  PLANNED INTERVENTIONS: Therapeutic exercises, Therapeutic activity, Neuromuscular re-education, Balance training, Gait training, Patient/Family education, Self Care, Joint mobilization, Joint manipulation, Vestibular training, Canalith repositioning, Orthotic/Fit training, DME instructions, Dry Needling, Electrical stimulation, Spinal manipulation, Spinal mobilization, Cryotherapy, Moist heat, Taping, Traction, Ultrasound, Ionotophoresis 4mg /ml Dexamethasone, Manual therapy, and Re-evaluation.  PLAN FOR NEXT SESSION: Progress strengthening and manual techniques, TDN to anterior tibialis, modify/review HEP as needed;   Cristal Deer Luevenia Mcavoy SPT Barbara Cower D Huprich PT, DPT, GCS

## 2023-03-15 ENCOUNTER — Ambulatory Visit: Payer: BC Managed Care – PPO | Attending: Orthopaedic Surgery

## 2023-03-15 DIAGNOSIS — M6281 Muscle weakness (generalized): Secondary | ICD-10-CM | POA: Diagnosis present

## 2023-03-15 DIAGNOSIS — M25571 Pain in right ankle and joints of right foot: Secondary | ICD-10-CM | POA: Diagnosis present

## 2023-03-15 NOTE — Therapy (Addendum)
OUTPATIENT PHYSICAL THERAPY ANKLE/FOOT TREATMENT NOTE  Patient Name: [name] MRN: [id number] DOB:[birth year], 42 y.o., female Today's Date: 03/15/2023  END OF SESSION:  PT End of Session - 03/15/23 1648     Visit Number 11    Number of Visits 17    Date for PT Re-Evaluation 03/29/23    Authorization Type eval: 02/01/23    PT Start Time 1646    PT Stop Time 1735    PT Time Calculation (min) 49 min    Equipment Utilized During Treatment Other (comment)   BioSkin Ankle Brace   Activity Tolerance Patient tolerated treatment well    Behavior During Therapy WFL for tasks assessed/performed             Past Medical History:  Diagnosis Date   Anxiety    Arthritis    right ankle   Bony exostosis 09/2014   right knee   GERD (gastroesophageal reflux disease)    History of migraine    Hypertension    Neuromuscular disorder (HCC)    trigeminal nerve pain   Pain, eye, left 10/10/2014   with tingling - states possible shingles; to see PCP   Painful orthopaedic hardware (HCC) 09/2014   right ankle   Past Surgical History:  Procedure Laterality Date   BONE EXOSTOSIS EXCISION Right 10/16/2014   Procedure: EXCISION OF BONY EXOSTOSIS FROM RIGHT KNEE;  Surgeon: Tarry Kos, MD;  Location: [facility];  Service: Orthopedics;  Laterality: Right;   CHOLECYSTECTOMY  07/12/2006   DILATATION & CURRETTAGE/HYSTEROSCOPY WITH RESECTOCOPE N/A 09/24/2022   Procedure: DILATATION & CURETTAGE/HYSTEROSCOPY WITH RESECTOCOPE;  Surgeon: Maxie Better, MD;  Location: [facility];  Service: Gynecology;  Laterality: N/A;   ENDOMETRIAL ABLATION N/A 09/24/2022   Procedure: ENDOMETRIAL ABLATION;  Surgeon: Maxie Better, MD;  Location: [facility];  Service: Gynecology;  Laterality: N/A;   HARDWARE REMOVAL Right 10/16/2014   Procedure: HARDWARE REMOVAL RIGHT ANKLE;  Surgeon: Tarry Kos, MD;  Location: [facility];  Service:  Orthopedics;  Laterality: Right;   KNEE ARTHROSCOPY Right 09/01/2007   ORIF ANKLE FRACTURE BIMALLEOLAR Right 02/28/2007   ORIF TIBIA FRACTURE Right 02/28/2007   rod placement right   TIBIA HARDWARE REMOVAL Right 09/01/2007   There are no problems to display for this patient.   PCP: Jerl Mina MD  REFERRING PROVIDER: Gershon Mussel MD  REFERRING DIAG: [phone] (ICD-10-CM) - Pain in right ankle and joints of right foot   Rationale for Evaluation and Treatment: Rehabilitation  THERAPY DIAG: Pain in right ankle and joints of right foot  Muscle weakness (generalized)  ONSET DATE: Early 2024  FOLLOW-UP APPT SCHEDULED WITH REFERRING PROVIDER: No   From Initial Evaluation SUBJECTIVE:  SUBJECTIVE STATEMENT:  R ankle/foot pain  PERTINENT HISTORY:  Patient is a pleasant 42 year old female who was referred to physical therapy for right ankle pain. She underwent R ankle ORIF after "shattering my ankle" (trimalleolar fracture) in 2008 when she fell down stairs. She is now status post right tibial nail with subsequent hardware removal (lateral plate for fibula fracture removed) back in 2016. She still has the tibial IM nail in place. Pt was doing well until earlier this year when her R foot and ankle started to hurt. She denies any acute injury and states that the only thing she can think might have contributed to her pain was a change in her footwear. She went to [facility] where plain film radiographs showed no acute changes. She was given a home exercise program which did help however pain still remains so she was seen by Dr. Roda Shutters in [place] who referred her for physical therapy. She is now having intermittent discomfort to the R anterior/anteromedial ankle as well as pain in the distal lateral foot (ankle pain  started first followed by foot pain at later date). She has pain most days and symptoms are worse with walking as well as at the end of the day. She notes occasional anterior/anterolateral swelling in the ankle.    R ankle XR (03/01/2007) IMPRESSION:   Spiral type fracture involving the distal fibular shaft above the ankle mortise. There are also two fractures involving the tibia, one is in the mid-tibial shaft region and the other is distally.   XR Ankle Complete Right (01/05/2023) No acute fracture noted.  She does have mild degenerative changes throughout the ankle in addition to a healed fibula fracture.  No hardware complication of the tibial nail.  PAIN:    Pain Intensity: Present: 0/10, Best: 0/10, Worst: 8/10 Pain location: Anterior and anteromedial ankle "deep soreness", R foot pain over the distal 4th and 5th metatarsals (dull ache).  Pain Quality: dull and sore Radiating: No  Swelling: Yes, anterior/anteromedial ankle; Popping, catching, locking: Yes, occasional "pop" which offers pain relief; Numbness/Tingling: No Focal Weakness: Yes, some R ankle weakness reported Aggravating factors: extended standing/walking, exercising, painful walking after first waking in the morning; Relieving factors: pain medicine, rest (offloading), ice, compression stockings at night and sometimes during the day, no benefit with NSAIDs 24-hour pain behavior: Varies throughout the day. Bad especially first thing in the morning History of prior back, hip, knee, or ankle injury, pain, surgery, or therapy: Yes, history of R ankle surgery. R knee pain and swelling since her fall in 2008. She has also been having R low back pain since her injury. Her back pain occasionally radiates into the R hip but never into the knee or ankle;  Dominant hand: right Imaging: Yes, see history Typical footwear: pt has been wearing sneakers since her R foot/ankle pain started Red flags: Positive for weight gain, Negative for  personal history of cancer, chills/fever, night sweats, nausea, vomiting;  PRECAUTIONS: None  WEIGHT BEARING RESTRICTIONS: No  FALLS: Has patient fallen in last 6 months? No  Living Environment Lives with: lives with their daughter Lives in: House/apartment (two level townhome) Stairs: bedroom upstairs and pt has to occasionally Roberto Romanoski one step at a time due to the pain;  Prior level of function: Independent  Occupational demands: Gaffer at [employer]  Hobbies: exercising, eating different foods, spending time with friends  Patient Goals: Pt would like to be able to exercise and walk her dog without pain, Diasha Castleman  for a walk with her dog without pain    OBJECTIVE:   Patient Surveys  LEFS To be completed FOTO 47, predicted improvement to 78  Cognition Patient is oriented to person, place, and time.  Recent memory is intact.  Remote memory is intact.  Attention span and concentration are intact.  Expressive speech is intact.  Patient's fund of knowledge is within normal limits for educational level.    Gross Musculoskeletal Assessment Bulk: Normal Tone: Normal No trophic changes noted to foot/ankle. No ecchymosis, erythema, or edema noted. No gross ankle/foot deformity noted  GAIT: Full gait assessment deferred  Posture: No gross deficits in seated or standing posture which would contribute to her pain;  AROM AROM (Normal range in degrees) AROM   Hip Right Left  Flexion (125)    Extension (15)    Abduction (40)    Adduction     Internal Rotation (45)    External Rotation (45)        Knee    Flexion (135) WNL WNL  Extension (0) WNL WNL      Ankle    Dorsiflexion (20) 12 28  Plantarflexion (50) 50 42  Inversion (35) 18 30  Eversion (15) 8 18  (* = pain; Blank rows = not tested)   LE MMT: MMT (out of 5) Right  Left   Hip flexion 5 5  Hip extension    Hip abduction    Hip adduction    Hip internal rotation    Hip external rotation     Knee flexion 5 5  Knee extension 5 5  Ankle dorsiflexion 4* 5  Ankle plantarflexion 5 5  Ankle inversion 5 5  Ankle eversion 5 5  (* = pain; Blank rows = not tested)  Sensation Grossly intact to light touch throughout bilateral LEs as determined by testing dermatomes L2-S2. Proprioception, stereognosis, and hot/cold testing deferred on this date.  Reflexes Deferred  Muscle Length Deferred  Palpation Pain with palpation to anteromedial ankle near the scar from her surgery. Pain with palpation over tibialis anterior distal muscle belly and tendon. Pain to posterior tibialis tendon but no pain at navicular. Painful at base of 5th metatarsal and in space between 3rd and 4th/4th and 5th metatarsals.  Passive Accessory Motion Deferred  VASCULAR Deferred  SPECIAL TESTS Ligamentous Integrity Anterior Drawer (ATF, 10-15 plantarflexion with anterior translation): Negative Talar Tilt (CFL, inversion): Negative Eversion Stress Test (Deltoid, eversion): Negative External Rotation Test (High ankle, dorsiflexion and external rotation): Negative Squeeze Test (High ankle): Negative Impingment Sign (Dorsiflexion and eversion): Negative  Achilles Integrity Thompson Test: Negative  Fracture Screening Metatarsal Axial Loading: Negative Tap/Percussion Test: Not examined Vibration Test: Not examined  Pronation/Supination Navicular Drop: Not examined  Nerve Test Tarsal Tunnel Test (maximal DF, EV, toe ext with tapping over tarsal tunnel): Not examined Test for Morton's Neuroma (compress metatarsals and mobilize): Positive  Other Windlass Mechanism Test: Negative   Beighton Scale Deferred   TODAY'S TREATMENT   SUBJECTIVE: Patient reported to physical therapy with no new reports. Continued soreness at posterior tibia (tender to palpation) following dry needling session. No further questions or concerns.    PAIN: 10/10 pain at posterior tibia with palpation.    Ther-ex: PT  applied BioSkin Trifit Ankle Brace; Airex Pad Double Heel Raise 2 x 10 (second set with ball squeeze at ankle);  Airex Pad 12" Step Tap 1 x 20, 1 x 10 Alternating BLE;  Airex Pad SLS Balance with 2 cones and 12"  step tapping 2 x 10 taps BLE (second set with PT dictating LE and direction)  Long Sitting Ankle Eversion 2 x 10 reps x 5s hold with green theraband BLE;  Long Sitting Ankle Dorsiflexion, Inversion, Eversion 1 x 10 reps x 5s hold with manual  theraband (LLE);   Manual Therapy: Extensive STM medial soleus, medial gastrocnemius and posteromedial tibia; myofascial release and trigger point release techniques utilized.    Not performed: Airex Pad BLE Calf Raises at Steps 2 x10, no pain reported; Banded Eversion, Green TB 2 x 10;  BLE Feet on Green and Pink balance pad, no UE support, EC 3 x 30s; R Ankle Dorsiflexion, Plantarflexion, Inversion, Eversion Isometric 2 x 10 x 10s; R ankle A/P mobilizations at available end range dorsiflexion, grade I-II, 30s/bout x 2 bouts; R ankle talocrural distraction, grade I-II, 30s/bout x 3 bouts; Extensive STM to medial, anterior, and lateral ankle as well as proximally along anterior tibialis muscle; Wall Supported Toe Raises 2 x 10 x 2s hold;   PATIENT EDUCATION:  Education details: Pt educated throughout session about proper posture and technique with exercises. Educated patient on self STM at Tibialis anterior for pain and stiffness Improved exercise technique, movement at target joints, use of target muscles after min to mod verbal, visual, tactile cues.  Person educated: Patient Education method: Medical illustrator Education comprehension: verbalized and demonstrated understanding   HOME EXERCISE PROGRAM:  Access Code: [email or web address] URL: [email or web address] Date: 02/17/2023 Prepared by: Ria Comment  Exercises - Seated Figure 4 Ankle Inversion with Resistance  - 1 x daily - 7 x weekly - 1-2 sets - 10 reps - 2-3s  hold - Seated Ankle Eversion with Resistance (Mirrored)  - 1 x daily - 7 x weekly - 1-2 sets - 10 reps - 2-3s hold - Toe Raise With Back Against Wall  - 1 x daily - 7 x weekly - 1-2 sets - 10 reps - 2-3s hold (On Pause)  - Standing Bilateral Gastroc Stretch with Step  - 1 x daily - 7 x weekly - 3 reps - 30-45s hold   ASSESSMENT:  CLINICAL IMPRESSION: Patient arrived to physical therapy with no new reports. Patient's pain in posterior tibia without improvement since last appointment; PT performed STM to posterior tibia with minor improvement to pain. Patient able to perform calve raises and single leg balance without additional report of pain at forefoot. Plan to continue strengthening ankle and LE strength within patient tolerance. Patient has endorsed improvement in pain with Anterior tibialis muscle and anterior ankle joint. No updates to HEP today. Patient will continue benefit from PT services to address deficits in strength, range of motion, and pain in order to return to full function at home, work, and with leisure activities.  OBJECTIVE IMPAIRMENTS: difficulty walking, decreased strength, and pain.   ACTIVITY LIMITATIONS: standing and stairs  PARTICIPATION LIMITATIONS: shopping, community activity, occupation, and exercise  PERSONAL FACTORS: Past/current experiences and 1-2 comorbidities: anxiety and R ankle arthritis  are also affecting patient's functional outcome.   REHAB POTENTIAL: Good  CLINICAL DECISION MAKING: Evolving/moderate complexity  EVALUATION COMPLEXITY: Moderate   GOALS: Goals reviewed with patient? No  SHORT TERM GOALS: Target date: 03/01/2023  Pt will be independent with HEP to improve strength and decrease ankle pain to improve pain-free function at home and work. Baseline:  Goal status: PROGRESSING   LONG TERM GOALS: Target date: 03/29/2023  Pt will increase FOTO to at least 67 to demonstrate significant improvement in function at home  and work related  to ankle pain  Baseline: 02/01/23: 47; 03/10/2023: 37  Goal status: ONGOING  2.  Pt will decrease worst ankle pain by at least 3 points on the NPRS in order to demonstrate clinically significant reduction in ankle pain. Baseline: 02/01/23: worst: 8/10; 03/10/2023: Worst 8/10, Goal status: ONGOING  3.  Pt will increase pain-free strength of R ankle dorsiflexion to 5/5 MMT grade in order to demonstrate improvement in strength and function  Baseline: 02/01/23: 4/5, painful, 03/10/2023: 5/5, Painless  Goal status: GOAL MET    PLAN: PT FREQUENCY: 1-2x/week  PT DURATION: 8 weeks  PLANNED INTERVENTIONS: Therapeutic exercises, Therapeutic activity, Neuromuscular re-education, Balance training, Gait training, Patient/Family education, Self Care, Joint mobilization, Joint manipulation, Vestibular training, Canalith repositioning, Orthotic/Fit training, DME instructions, Dry Needling, Electrical stimulation, Spinal manipulation, Spinal mobilization, Cryotherapy, Moist heat, Taping, Traction, Ultrasound, Ionotophoresis 4mg /ml Dexamethasone, Manual therapy, and Re-evaluation.  PLAN FOR NEXT SESSION: Progress strengthening and manual techniques, TDN to anterior tibialis, modify/review HEP as needed;   Cristal Deer Zenora Karpel SPT Barbara Cower D Huprich PT, DPT, GCS

## 2023-03-17 ENCOUNTER — Ambulatory Visit: Payer: BC Managed Care – PPO

## 2023-03-21 ENCOUNTER — Other Ambulatory Visit: Payer: Self-pay | Admitting: Student

## 2023-03-21 DIAGNOSIS — G43719 Chronic migraine without aura, intractable, without status migrainosus: Secondary | ICD-10-CM

## 2023-03-22 ENCOUNTER — Ambulatory Visit: Payer: BC Managed Care – PPO

## 2023-03-24 ENCOUNTER — Ambulatory Visit: Payer: BC Managed Care – PPO

## 2023-03-24 DIAGNOSIS — M25571 Pain in right ankle and joints of right foot: Secondary | ICD-10-CM | POA: Diagnosis not present

## 2023-03-24 DIAGNOSIS — M6281 Muscle weakness (generalized): Secondary | ICD-10-CM

## 2023-03-24 NOTE — Therapy (Addendum)
OUTPATIENT PHYSICAL THERAPY ANKLE/FOOT TREATMENT NOTE  Patient Name: [name] MRN: [id number] DOB:04-12-1981, 42 y.o., female Today's Date: 03/26/2023  END OF SESSION:  PT End of Session - 03/26/23 1333     Visit Number 12    Number of Visits 17    Date for PT Re-Evaluation 03/29/23    Authorization Type eval: 02/01/23    PT Start Time 1703    PT Stop Time 1745    PT Time Calculation (min) 42 min    Equipment Utilized During Treatment Other (comment)   BioSkin Ankle Brace   Activity Tolerance Patient tolerated treatment well    Behavior During Therapy WFL for tasks assessed/performed            Past Medical History:  Diagnosis Date   Anxiety    Arthritis    right ankle   Bony exostosis 09/2014   right knee   GERD (gastroesophageal reflux disease)    History of migraine    Hypertension    Neuromuscular disorder (HCC)    trigeminal nerve pain   Pain, eye, left 10/10/2014   with tingling - states possible shingles; to see PCP   Painful orthopaedic hardware (HCC) 09/2014   right ankle   Past Surgical History:  Procedure Laterality Date   BONE EXOSTOSIS EXCISION Right 10/16/2014   Procedure: EXCISION OF BONY EXOSTOSIS FROM RIGHT KNEE;  Surgeon: Tarry Kos, MD;  Location: [facility];  Service: Orthopedics;  Laterality: Right;   CHOLECYSTECTOMY  07/12/2006   DILATATION & CURRETTAGE/HYSTEROSCOPY WITH RESECTOCOPE N/A 09/24/2022   Procedure: DILATATION & CURETTAGE/HYSTEROSCOPY WITH RESECTOCOPE;  Surgeon: Maxie Better, MD;  Location: [facility];  Service: Gynecology;  Laterality: N/A;   ENDOMETRIAL ABLATION N/A 09/24/2022   Procedure: ENDOMETRIAL ABLATION;  Surgeon: Maxie Better, MD;  Location: [facility] [place];  Service: Gynecology;  Laterality: N/A;   HARDWARE REMOVAL Right 10/16/2014   Procedure: HARDWARE REMOVAL RIGHT ANKLE;  Surgeon: Tarry Kos, MD;  Location: [facility];  Service:  Orthopedics;  Laterality: Right;   KNEE ARTHROSCOPY Right 09/01/2007   ORIF ANKLE FRACTURE BIMALLEOLAR Right 02/28/2007   ORIF TIBIA FRACTURE Right 02/28/2007   rod placement right   TIBIA HARDWARE REMOVAL Right 09/01/2007   There are no problems to display for this patient.  PCP: Jerl Mina MD  REFERRING PROVIDER: Gershon Mussel MD  REFERRING DIAG: [phone] (ICD-10-CM) - Pain in right ankle and joints of right foot   Rationale for Evaluation and Treatment: Rehabilitation  THERAPY DIAG: Pain in right ankle and joints of right foot  Muscle weakness (generalized)  ONSET DATE: Early 2024  FOLLOW-UP APPT SCHEDULED WITH REFERRING PROVIDER: No   From Initial Evaluation SUBJECTIVE:  SUBJECTIVE STATEMENT:  R ankle/foot pain  PERTINENT HISTORY:  Patient is a pleasant 42 year old female who was referred to physical therapy for right ankle pain. She underwent R ankle ORIF after "shattering my ankle" (trimalleolar fracture) in 2008 when she fell down stairs. She is now status post right tibial nail with subsequent hardware removal (lateral plate for fibula fracture removed) back in 2016. She still has the tibial IM nail in place. Pt was doing well until earlier this year when her R foot and ankle started to hurt. She denies any acute injury and states that the only thing she can think might have contributed to her pain was a change in her footwear. She went to [facility] where plain film radiographs showed no acute changes. She was given a home exercise program which did help however pain still remains so she was seen by Dr. Roda Shutters in [place] who referred her for physical therapy. She is now having intermittent discomfort to the R anterior/anteromedial ankle as well as pain in the distal lateral foot (ankle pain  started first followed by foot pain at later date). She has pain most days and symptoms are worse with walking as well as at the end of the day. She notes occasional anterior/anterolateral swelling in the ankle.    R ankle XR (03/01/2007) IMPRESSION:   Spiral type fracture involving the distal fibular shaft above the ankle mortise. There are also two fractures involving the tibia, one is in the mid-tibial shaft region and the other is distally.   XR Ankle Complete Right (01/05/2023) No acute fracture noted.  She does have mild degenerative changes throughout the ankle in addition to a healed fibula fracture.  No hardware complication of the tibial nail.  PAIN:    Pain Intensity: Present: 0/10, Best: 0/10, Worst: 8/10 Pain location: Anterior and anteromedial ankle "deep soreness", R foot pain over the distal 4th and 5th metatarsals (dull ache).  Pain Quality: dull and sore Radiating: No  Swelling: Yes, anterior/anteromedial ankle; Popping, catching, locking: Yes, occasional "pop" which offers pain relief; Numbness/Tingling: No Focal Weakness: Yes, some R ankle weakness reported Aggravating factors: extended standing/walking, exercising, painful walking after first waking in the morning; Relieving factors: pain medicine, rest (offloading), ice, compression stockings at night and sometimes during the day, no benefit with NSAIDs 24-hour pain behavior: Varies throughout the day. Bad especially first thing in the morning History of prior back, hip, knee, or ankle injury, pain, surgery, or therapy: Yes, history of R ankle surgery. R knee pain and swelling since her fall in 2008. She has also been having R low back pain since her injury. Her back pain occasionally radiates into the R hip but never into the knee or ankle;  Dominant hand: right Imaging: Yes, see history Typical footwear: pt has been wearing sneakers since her R foot/ankle pain started Red flags: Positive for weight gain, Negative for  personal history of cancer, chills/fever, night sweats, nausea, vomiting;  PRECAUTIONS: None  WEIGHT BEARING RESTRICTIONS: No  FALLS: Has patient fallen in last 6 months? No  Living Environment Lives with: lives with their daughter Lives in: House/apartment (two level townhome) Stairs: bedroom upstairs and pt has to occasionally [name] one step at a time due to the pain;  Prior level of function: Independent  Occupational demands: Gaffer at [employer]  Hobbies: exercising, eating different foods, spending time with friends  Patient Goals: Pt would like to be able to exercise and walk her dog without pain, [name]  for a walk with her dog without pain    OBJECTIVE:   Patient Surveys  LEFS To be completed FOTO 47, predicted improvement to 9  Cognition Patient is oriented to person, place, and time.  Recent memory is intact.  Remote memory is intact.  Attention span and concentration are intact.  Expressive speech is intact.  Patient's fund of knowledge is within normal limits for educational level.    Gross Musculoskeletal Assessment Bulk: Normal Tone: Normal No trophic changes noted to foot/ankle. No ecchymosis, erythema, or edema noted. No gross ankle/foot deformity noted  GAIT: Full gait assessment deferred  Posture: No gross deficits in seated or standing posture which would contribute to her pain;  AROM AROM (Normal range in degrees) AROM   Hip Right Left  Flexion (125)    Extension (15)    Abduction (40)    Adduction     Internal Rotation (45)    External Rotation (45)        Knee    Flexion (135) WNL WNL  Extension (0) WNL WNL      Ankle    Dorsiflexion (20) 12 28  Plantarflexion (50) 50 42  Inversion (35) 18 30  Eversion (15) 8 18  (* = pain; Blank rows = not tested)   LE MMT: MMT (out of 5) Right  Left   Hip flexion 5 5  Hip extension    Hip abduction    Hip adduction    Hip internal rotation    Hip external rotation     Knee flexion 5 5  Knee extension 5 5  Ankle dorsiflexion 4* 5  Ankle plantarflexion 5 5  Ankle inversion 5 5  Ankle eversion 5 5  (* = pain; Blank rows = not tested)  Sensation Grossly intact to light touch throughout bilateral LEs as determined by testing dermatomes L2-S2. Proprioception, stereognosis, and hot/cold testing deferred on this date.  Reflexes Deferred  Muscle Length Deferred  Palpation Pain with palpation to anteromedial ankle near the scar from her surgery. Pain with palpation over tibialis anterior distal muscle belly and tendon. Pain to posterior tibialis tendon but no pain at navicular. Painful at base of 5th metatarsal and in space between 3rd and 4th/4th and 5th metatarsals.  Passive Accessory Motion Deferred  VASCULAR Deferred  SPECIAL TESTS Ligamentous Integrity Anterior Drawer (ATF, 10-15 plantarflexion with anterior translation): Negative Talar Tilt (CFL, inversion): Negative Eversion Stress Test (Deltoid, eversion): Negative External Rotation Test (High ankle, dorsiflexion and external rotation): Negative Squeeze Test (High ankle): Negative Impingment Sign (Dorsiflexion and eversion): Negative  Achilles Integrity Thompson Test: Negative  Fracture Screening Metatarsal Axial Loading: Negative Tap/Percussion Test: Not examined Vibration Test: Not examined  Pronation/Supination Navicular Drop: Not examined  Nerve Test Tarsal Tunnel Test (maximal DF, EV, toe ext with tapping over tarsal tunnel): Not examined Test for Morton's Neuroma (compress metatarsals and mobilize): Positive  Other Windlass Mechanism Test: Negative   Beighton Scale Deferred   TODAY'S TREATMENT   SUBJECTIVE: Patient reported to physical therapy with no new reports. Continued soreness at posterior tibia in right leg, some soreness on the anterolateral aspect of the left leg. No further questions or concerns.    PAIN: 3/10 pain in right ankle, 5/10 pain in the left  ankle.     Ther-ex: PT applied BioSkin Trifit Ankle Brace; Airex Pad Double Heel Raise 2 x 10  Long Sitting Ankle Eversion 2 x 10 reps x 5s hold with green theraband BLE;  Long Sitting Ankle  Dorsiflexion, Inversion, Eversion 1 x 10 reps x 5s hold with manual  theraband (LLE); Prone Calf Raise against manual resistance following calf stretch 1 x 10 reps on right leg;    Manual Therapy: Extensive STM medial soleus, medial gastrocnemius and posteromedial tibia (right leg), lateral soleus and anterior tibialis stretch (left leg); myofascial release and trigger point release techniques utilized.  Pain improved in left leg following trigger point release and stretch;   Not performed: Airex Pad BLE Calf Raises at Steps 2 x10, no pain reported; Banded Eversion, Green TB 2 x 10;  BLE Feet on Green and Pink balance pad, no UE support, EC 3 x 30s; R Ankle Dorsiflexion, Plantarflexion, Inversion, Eversion Isometric 2 x 10 x 10s; R ankle A/P mobilizations at available end range dorsiflexion, grade I-II, 30s/bout x 2 bouts; R ankle talocrural distraction, grade I-II, 30s/bout x 3 bouts; Extensive STM to medial, anterior, and lateral ankle as well as proximally along anterior tibialis muscle; Wall Supported Toe Raises 2 x 10 x 2s hold;   PATIENT EDUCATION:  Education details: Pt educated throughout session about proper posture and technique with exercises. Educated patient on self STM at Tibialis anterior for pain and stiffness Improved exercise technique, movement at target joints, use of target muscles after min to mod verbal, visual, tactile cues.  Person educated: Patient Education method: Medical illustrator Education comprehension: verbalized and demonstrated understanding   HOME EXERCISE PROGRAM:  Access Code: [email or web address] URL: [email or web address] Date: 02/17/2023 Prepared by: Ria Comment  Exercises - Seated Figure 4 Ankle Inversion with Resistance  - 1 x  daily - 7 x weekly - 1-2 sets - 10 reps - 2-3s hold - Seated Ankle Eversion with Resistance (Mirrored)  - 1 x daily - 7 x weekly - 1-2 sets - 10 reps - 2-3s hold - Toe Raise With Back Against Wall  - 1 x daily - 7 x weekly - 1-2 sets - 10 reps - 2-3s hold (On Pause)  - Standing Bilateral Gastroc Stretch with Step  - 1 x daily - 7 x weekly - 3 reps - 30-45s hold   ASSESSMENT:  CLINICAL IMPRESSION: Patient arrived to physical therapy with no new reports. Patient's pain in posterior tibia without improvement since last appointment. Additionally pt reported pain in left anterior tibialis muscle; improved with manual therapy. Patient's forefoot pain in right leg still without improvement and she continues to report pain with weight bearing activities. She tolerated calf raises on airex pad without additional report of pain but fatigued quickly. No updates to HEP today. She will have a follow up appointment with her podiatrist next week regarding the pain in the left foot. Patient will continue benefit from PT services to address deficits in strength, range of motion, and pain in order to return to full function at home, work, and with leisure activities.  OBJECTIVE IMPAIRMENTS: difficulty walking, decreased strength, and pain.   ACTIVITY LIMITATIONS: standing and stairs  PARTICIPATION LIMITATIONS: shopping, community activity, occupation, and exercise  PERSONAL FACTORS: Past/current experiences and 1-2 comorbidities: anxiety and R ankle arthritis  are also affecting patient's functional outcome.   REHAB POTENTIAL: Good  CLINICAL DECISION MAKING: Evolving/moderate complexity  EVALUATION COMPLEXITY: Moderate   GOALS: Goals reviewed with patient? No  SHORT TERM GOALS: Target date: 03/01/2023  Pt will be independent with HEP to improve strength and decrease ankle pain to improve pain-free function at home and work. Baseline:  Goal status: PROGRESSING   LONG TERM GOALS: Target date:  03/29/2023  Pt will increase FOTO to at least 67 to demonstrate significant improvement in function at home and work related to ankle pain  Baseline: 02/01/23: 47; 03/10/2023: 37  Goal status: ONGOING  2.  Pt will decrease worst ankle pain by at least 3 points on the NPRS in order to demonstrate clinically significant reduction in ankle pain. Baseline: 02/01/23: worst: 8/10; 03/10/2023: Worst 8/10, Goal status: ONGOING  3.  Pt will increase pain-free strength of R ankle dorsiflexion to 5/5 MMT grade in order to demonstrate improvement in strength and function  Baseline: 02/01/23: 4/5, painful, 03/10/2023: 5/5, Painless  Goal status: GOAL MET    PLAN: PT FREQUENCY: 1-2x/week  PT DURATION: 8 weeks  PLANNED INTERVENTIONS: Therapeutic exercises, Therapeutic activity, Neuromuscular re-education, Balance training, Gait training, Patient/Family education, Self Care, Joint mobilization, Joint manipulation, Vestibular training, Canalith repositioning, Orthotic/Fit training, DME instructions, Dry Needling, Electrical stimulation, Spinal manipulation, Spinal mobilization, Cryotherapy, Moist heat, Taping, Traction, Ultrasound, Ionotophoresis 4mg /ml Dexamethasone, Manual therapy, and Re-evaluation.  PLAN FOR NEXT SESSION: Progress strengthening and manual techniques, TDN to anterior tibialis, modify/review HEP as needed;   Cristal Deer Feliciano Wynter SPT Barbara Cower D Huprich PT, DPT, GCS

## 2023-03-28 ENCOUNTER — Ambulatory Visit
Admission: RE | Admit: 2023-03-28 | Discharge: 2023-03-28 | Disposition: A | Discharge status: Home / Self Care | Payer: BC Managed Care – PPO | Source: Ambulatory Visit | Attending: Student | Admitting: Student

## 2023-03-28 DIAGNOSIS — G43719 Chronic migraine without aura, intractable, without status migrainosus: Secondary | ICD-10-CM | POA: Diagnosis present

## 2023-03-28 MED ORDER — GADOBUTROL 1 MMOL/ML IV SOLN
10.0000 mL | Freq: Once | INTRAVENOUS | Status: AC | PRN
  Administered 2023-03-28: 10 mL via INTRAVENOUS

## 2023-03-29 ENCOUNTER — Ambulatory Visit (INDEPENDENT_AMBULATORY_CARE_PROVIDER_SITE_OTHER): Payer: BC Managed Care – PPO | Admitting: Podiatry

## 2023-03-29 DIAGNOSIS — M7751 Other enthesopathy of right foot: Secondary | ICD-10-CM

## 2023-03-29 MED ORDER — BETAMETHASONE SOD PHOS & ACET 6 (3-3) MG/ML IJ SUSP: 3.0000 mg | Freq: Once | INTRAMUSCULAR | Status: AC

## 2023-03-29 NOTE — Progress Notes (Signed)
No chief complaint on file.   Subjective:  42 y.o. female presenting today for follow-up evaluation of right ankle pain and forefoot pain.  Patient states that the injection helped significantly to the right ankle.  She no longer has any pain or tenderness associated.  She continues however to have some continued pain and tenderness associated to the right forefoot..     Brief history: Patient has a history of ORIF right ankle 2008 followed by removal of fibular hardware 2016.  Patient states that in February 2024 she began to experience right ankle pain.  Idiopathic onset.  After few months she began to experience right forefoot pain.  She does admit to compensating with an altered gait.  Denies any history of injury or change in activity.  She was seen at [facility] and also with Ortho care and physical therapy was initiated.  She says that physical therapy is helping but she continues to have pain and tenderness to the right ankle and foot   Past Medical History:  Diagnosis Date   Anxiety    Arthritis    right ankle   Bony exostosis 09/2014   right knee   GERD (gastroesophageal reflux disease)    History of migraine    Hypertension    Neuromuscular disorder (HCC)    trigeminal nerve pain   Pain, eye, left 10/10/2014   with tingling - states possible shingles; to see PCP   Painful orthopaedic hardware (HCC) 09/2014   right ankle    Past Surgical History:  Procedure Laterality Date   BONE EXOSTOSIS EXCISION Right 10/16/2014   Procedure: EXCISION OF BONY EXOSTOSIS FROM RIGHT KNEE;  Surgeon: Tarry Kos, MD;  Location: [facility];  Service: Orthopedics;  Laterality: Right;   CHOLECYSTECTOMY  07/12/2006   DILATATION & CURRETTAGE/HYSTEROSCOPY WITH RESECTOCOPE N/A 09/24/2022   Procedure: DILATATION & CURETTAGE/HYSTEROSCOPY WITH RESECTOCOPE;  Surgeon: Maxie Better, MD;  Location: [facility];  Service: Gynecology;  Laterality: N/A;   ENDOMETRIAL  ABLATION N/A 09/24/2022   Procedure: ENDOMETRIAL ABLATION;  Surgeon: Maxie Better, MD;  Location: [facility];  Service: Gynecology;  Laterality: N/A;   HARDWARE REMOVAL Right 10/16/2014   Procedure: HARDWARE REMOVAL RIGHT ANKLE;  Surgeon: Tarry Kos, MD;  Location: [facility];  Service: Orthopedics;  Laterality: Right;   KNEE ARTHROSCOPY Right 09/01/2007   ORIF ANKLE FRACTURE BIMALLEOLAR Right 02/28/2007   ORIF TIBIA FRACTURE Right 02/28/2007   rod placement right   TIBIA HARDWARE REMOVAL Right 09/01/2007    Allergies  Allergen Reactions   Penicillin G Other (See Comments)   Yeast-Derived Drug Products Other (See Comments)    States she avoids yeast products as they cause her yeast infections   Penicillins Other (See Comments)    UNKNOWN - AS AN INFANT    Objective / Physical Exam:  General:  The patient is alert and oriented x3 in no acute distress. Dermatology:  Skin is warm, dry and supple bilateral lower extremities. Negative for open lesions or macerations. Vascular:  Palpable pedal pulses bilaterally. No edema or erythema noted. Capillary refill within normal limits. Neurological:  Epicritic and protective threshold grossly intact bilaterally.  Musculoskeletal Exam:  The pain to the right ankle joint resolved.  There is no pain with palpation or range of motion.  Muscle strength 5/5. There can use to be pain and tenderness to the lateral aspect of the right forefoot most localized around the fifth MTP.  There is pain with  palpation and range of motion of the fifth MTP  XR Ankle Complete Right 01/05/2023 No acute fracture noted.  She does have mild degenerative changes  throughout the ankle in addition to a healed fibula fracture.  No hardware complication of the tibial nail.     XR Foot Complete Right 01/05/2023 Narrative No acute or structural abnormalities   Assessment: 1.  Capsulitis right ankle 2.  Fifth MTP capsulitis  right  Plan of Care:  -Patient was evaluated. X-Rays reviewed.  -Ankle doing well.  Continue physical therapy since the patient states that it is beneficial and she continues to improve with physical therapy. [facility] w/ Barbara Cower Huprich PT -Injection of 0.5 cc Celestone Soluspan injected on the fifth MTP of the right foot -Continue meloxicam 15 mg daily -Compression ankle sleeve dispensed.  Patient may discontinue the Tri-Lock ankle brace -OTC power step insoles dispensed.  Wear daily -Return to clinic 4 weeks   Felecia Shelling, DPM [facility]  Dr. Felecia Shelling, DPM    [address] [place], [place] [address]                Office [phone]  Fax [phone]

## 2023-03-31 ENCOUNTER — Ambulatory Visit: Payer: BC Managed Care – PPO

## 2023-03-31 DIAGNOSIS — M6281 Muscle weakness (generalized): Secondary | ICD-10-CM

## 2023-03-31 DIAGNOSIS — M25571 Pain in right ankle and joints of right foot: Secondary | ICD-10-CM

## 2023-03-31 NOTE — Therapy (Addendum)
OUTPATIENT PHYSICAL THERAPY ANKLE/FOOT TREATMENT RE-CERTIFICATION  Patient Name: [name] MRN: [id number] DOB:1981-02-20, 42 y.o., female Today's Date: 04/01/2023  END OF SESSION:  PT End of Session - 03/31/23 1704     Visit Number 13    Number of Visits 33    Date for PT Re-Evaluation 05/26/23    Authorization Type eval: 02/01/23    PT Start Time 1702    PT Stop Time 1745    PT Time Calculation (min) 43 min    Equipment Utilized During Treatment --    Activity Tolerance Patient tolerated treatment well    Behavior During Therapy WFL for tasks assessed/performed            Past Medical History:  Diagnosis Date   Anxiety    Arthritis    right ankle   Bony exostosis 09/2014   right knee   GERD (gastroesophageal reflux disease)    History of migraine    Hypertension    Neuromuscular disorder (HCC)    trigeminal nerve pain   Pain, eye, left 10/10/2014   with tingling - states possible shingles; to see PCP   Painful orthopaedic hardware (HCC) 09/2014   right ankle   Past Surgical History:  Procedure Laterality Date   BONE EXOSTOSIS EXCISION Right 10/16/2014   Procedure: EXCISION OF BONY EXOSTOSIS FROM RIGHT KNEE;  Surgeon: Robin Kos, MD;  Location: [facility];  Service: Orthopedics;  Laterality: Right;   CHOLECYSTECTOMY  07/12/2006   DILATATION & CURRETTAGE/HYSTEROSCOPY WITH RESECTOCOPE N/A 09/24/2022   Procedure: DILATATION & CURETTAGE/HYSTEROSCOPY WITH RESECTOCOPE;  Surgeon: Robin Better, MD;  Location: [facility];  Service: Gynecology;  Laterality: N/A;   ENDOMETRIAL ABLATION N/A 09/24/2022   Procedure: ENDOMETRIAL ABLATION;  Surgeon: Robin Better, MD;  Location: [facility];  Service: Gynecology;  Laterality: N/A;   HARDWARE REMOVAL Right 10/16/2014   Procedure: HARDWARE REMOVAL RIGHT ANKLE;  Surgeon: Robin Kos, MD;  Location:  SURGERY CENTER;  Service: Orthopedics;  Laterality: Right;    KNEE ARTHROSCOPY Right 09/01/2007   ORIF ANKLE FRACTURE BIMALLEOLAR Right 02/28/2007   ORIF TIBIA FRACTURE Right 02/28/2007   rod placement right   TIBIA HARDWARE REMOVAL Right 09/01/2007   There are no problems to display for this patient.  PCP: Robin Mina MD  REFERRING PROVIDER: Gershon Mussel MD  REFERRING DIAG: [phone] (ICD-10-CM) - Pain in right ankle and joints of right foot   Rationale for Evaluation and Treatment: Rehabilitation  THERAPY DIAG: Pain in right ankle and joints of right foot  Muscle weakness (generalized)  ONSET DATE: Early 2024  FOLLOW-UP APPT SCHEDULED WITH REFERRING PROVIDER: No   From Initial Evaluation SUBJECTIVE:  SUBJECTIVE STATEMENT:  R ankle/foot pain  PERTINENT HISTORY:  Patient is a pleasant 42 year old female who was referred to physical therapy for right ankle pain. She underwent R ankle ORIF after "shattering my ankle" (trimalleolar fracture) in 2008 when she fell down stairs. She is now status post right tibial nail with subsequent hardware removal (lateral plate for fibula fracture removed) back in 2016. She still has the tibial IM nail in place. Pt was doing well until earlier this year when her R foot and ankle started to hurt. She denies any acute injury and states that the only thing she can think might have contributed to her pain was a change in her footwear. She went to [facility] where plain film radiographs showed no acute changes. She was given a home exercise program which did help however pain still remains so she was seen by Dr. Roda Long in [place] who referred her for physical therapy. She is now having intermittent discomfort to the R anterior/anteromedial ankle as well as pain in the distal lateral foot (ankle pain started first followed by foot pain at  later date). She has pain most days and symptoms are worse with walking as well as at the end of the day. She notes occasional anterior/anterolateral swelling in the ankle.    R ankle XR (03/01/2007) IMPRESSION:   Spiral type fracture involving the distal fibular shaft above the ankle mortise. There are also two fractures involving the tibia, one is in the mid-tibial shaft region and the other is distally.   XR Ankle Complete Right (01/05/2023) No acute fracture noted.  She does have mild degenerative changes throughout the ankle in addition to a healed fibula fracture.  No hardware complication of the tibial nail.  PAIN:    Pain Intensity: Present: 0/10, Best: 0/10, Worst: 8/10 Pain location: Anterior and anteromedial ankle "deep soreness", R foot pain over the distal 4th and 5th metatarsals (dull ache).  Pain Quality: dull and sore Radiating: No  Swelling: Yes, anterior/anteromedial ankle; Popping, catching, locking: Yes, occasional "pop" which offers pain relief; Numbness/Tingling: No Focal Weakness: Yes, some R ankle weakness reported Aggravating factors: extended standing/walking, exercising, painful walking after first waking in the morning; Relieving factors: pain medicine, rest (offloading), ice, compression stockings at night and sometimes during the day, no benefit with NSAIDs 24-hour pain behavior: Varies throughout the day. Bad especially first thing in the morning History of prior back, hip, knee, or ankle injury, pain, surgery, or therapy: Yes, history of R ankle surgery. R knee pain and swelling since her fall in 2008. She has also been having R low back pain since her injury. Her back pain occasionally radiates into the R hip but never into the knee or ankle;  Dominant hand: right Imaging: Yes, see history Typical footwear: pt has been wearing sneakers since her R foot/ankle pain started Red flags: Positive for weight gain, Negative for personal history of cancer, chills/fever,  night sweats, nausea, vomiting;  PRECAUTIONS: None  WEIGHT BEARING RESTRICTIONS: No  FALLS: Has patient fallen in last 6 months? No  Living Environment Lives with: lives with their daughter Lives in: House/apartment (two level townhome) Stairs: bedroom upstairs and pt has to occasionally [name] one step at a time due to the pain;  Prior level of function: Independent  Occupational demands: Gaffer at [employer]  Hobbies: exercising, eating different foods, spending time with friends  Patient Goals: Pt would like to be able to exercise and walk her dog without pain, Carney Saxton  for a walk with her dog without pain    OBJECTIVE:   Patient Surveys  LEFS To be completed FOTO 47, predicted improvement to 57  Cognition Patient is oriented to person, place, and time.  Recent memory is intact.  Remote memory is intact.  Attention span and concentration are intact.  Expressive speech is intact.  Patient's fund of knowledge is within normal limits for educational level.    Gross Musculoskeletal Assessment Bulk: Normal Tone: Normal No trophic changes noted to foot/ankle. No ecchymosis, erythema, or edema noted. No gross ankle/foot deformity noted  GAIT: Full gait assessment deferred  Posture: No gross deficits in seated or standing posture which would contribute to her pain;  AROM AROM (Normal range in degrees) AROM   Hip Right Left  Flexion (125)    Extension (15)    Abduction (40)    Adduction     Internal Rotation (45)    External Rotation (45)        Knee    Flexion (135) WNL WNL  Extension (0) WNL WNL      Ankle    Dorsiflexion (20) 12 28  Plantarflexion (50) 50 42  Inversion (35) 18 30  Eversion (15) 8 18  (* = pain; Blank rows = not tested)   LE MMT: MMT (out of 5) Right  Left   Hip flexion 5 5  Hip extension    Hip abduction    Hip adduction    Hip internal rotation    Hip external rotation    Knee flexion 5 5  Knee extension 5 5   Ankle dorsiflexion 4* 5  Ankle plantarflexion 5 5  Ankle inversion 5 5  Ankle eversion 5 5  (* = pain; Blank rows = not tested)  Sensation Grossly intact to light touch throughout bilateral LEs as determined by testing dermatomes L2-S2. Proprioception, stereognosis, and hot/cold testing deferred on this date.  Reflexes Deferred  Muscle Length Deferred  Palpation Pain with palpation to anteromedial ankle near the scar from her surgery. Pain with palpation over tibialis anterior distal muscle belly and tendon. Pain to posterior tibialis tendon but no pain at navicular. Painful at base of 5th metatarsal and in space between 3rd and 4th/4th and 5th metatarsals.  Passive Accessory Motion Deferred  VASCULAR Deferred  SPECIAL TESTS Ligamentous Integrity Anterior Drawer (ATF, 10-15 plantarflexion with anterior translation): Negative Talar Tilt (CFL, inversion): Negative Eversion Stress Test (Deltoid, eversion): Negative External Rotation Test (High ankle, dorsiflexion and external rotation): Negative Squeeze Test (High ankle): Negative Impingment Sign (Dorsiflexion and eversion): Negative  Achilles Integrity Thompson Test: Negative  Fracture Screening Metatarsal Axial Loading: Negative Tap/Percussion Test: Not examined Vibration Test: Not examined  Pronation/Supination Navicular Drop: Not examined  Nerve Test Tarsal Tunnel Test (maximal DF, EV, toe ext with tapping over tarsal tunnel): Not examined Test for Morton's Neuroma (compress metatarsals and mobilize): Positive  Other Windlass Mechanism Test: Negative   Beighton Scale Deferred   TODAY'S TREATMENT   SUBJECTIVE: Patient with report of continued soreness in bilateral peroneals muscles. Followed up with podiatrist on Tuesday and received a steroid injection in MTP. No further questions or concerns.    PAIN: Denies resting pain at this time   Ther-ex: Applied Compression Sleeve on R foot;   Airex Marches  1 x 20 BLE; Airex Marches to 6" step BLE 1 x 10 ea LE;  Airex Marches to 12" step BLE 1 x 10 ea LE;  Tandem Gait on airex beam in //  bars x 3 trials;  Forward Lunge on Bosu (Pain in RLE with LLE in front);    Manual Therapy: Extensive STM peroneal muscles (right leg); lateral soleus, peroneal muscles (left leg); myofascial release and trigger point release techniques utilized.  Peroneal stretch 3 x 30s BLE;   Not performed: Airex Pad BLE Calf Raises at Steps 2 x10, no pain reported; Banded Eversion, Green TB 2 x 10;  BLE Feet on Green and Pink balance pad, no UE support, EC 3 x 30s; R Ankle Dorsiflexion, Plantarflexion, Inversion, Eversion Isometric 2 x 10 x 10s; R ankle A/P mobilizations at available end range dorsiflexion, grade I-II, 30s/bout x 2 bouts; R ankle talocrural distraction, grade I-II, 30s/bout x 3 bouts; Extensive STM to medial, anterior, and lateral ankle as well as proximally along anterior tibialis muscle; Wall Supported Toe Raises 2 x 10 x 2s hold;   PATIENT EDUCATION:  Education details: Pt educated throughout session about proper posture and technique with exercises. Educated patient on self STM at Tibialis anterior for pain and stiffness Improved exercise technique, movement at target joints, use of target muscles after min to mod verbal, visual, tactile cues.  Person educated: Patient Education method: Medical illustrator Education comprehension: verbalized and demonstrated understanding   HOME EXERCISE PROGRAM:  Access Code: [email or web address] URL: [email or web address] Date: 02/17/2023 Prepared by: Ria Comment  Exercises - Seated Figure 4 Ankle Inversion with Resistance  - 1 x daily - 7 x weekly - 1-2 sets - 10 reps - 2-3s hold - Seated Ankle Eversion with Resistance (Mirrored)  - 1 x daily - 7 x weekly - 1-2 sets - 10 reps - 2-3s hold - Toe Raise With Back Against Wall  - 1 x daily - 7 x weekly - 1-2 sets - 10 reps - 2-3s hold (On  Pause)  - Standing Bilateral Gastroc Stretch with Step  - 1 x daily - 7 x weekly - 3 reps - 30-45s hold   ASSESSMENT:  CLINICAL IMPRESSION: Patient arrived to physical therapy with following appointment with podiatrist earlier in the week. Patient presented with improved pain in the right foot. PT progressed ankle stability exercises today with good return demonstration from patient. Patient performed tandem balance with minimal UE support and without ankle brace. She reported pain and fatigue towards the end of the session; mitigated with seated rest break. Although pain in the ankle joint has persistent she has demonstrates improvement to 5/5 ankle dorsiflexion strength. Her most recent progress note has a regression on her FOTO 28 to 37 indicating a decrease in functional mobility and her worst pain experienced is still 8/10. (03/10/2023). Patient still has deficits with muscular endurance in extrinsic and intrinsic musculature. Patient will continue benefit from PT services to address deficits in strength, range of motion, and pain in order to return to full function at home, work, and with leisure activities.  OBJECTIVE IMPAIRMENTS: difficulty walking, decreased strength, and pain.   ACTIVITY LIMITATIONS: standing and stairs  PARTICIPATION LIMITATIONS: shopping, community activity, occupation, and exercise  PERSONAL FACTORS: Past/current experiences and 1-2 comorbidities: anxiety and R ankle arthritis  are also affecting patient's functional outcome.   REHAB POTENTIAL: Good  CLINICAL DECISION MAKING: Evolving/moderate complexity  EVALUATION COMPLEXITY: Moderate   GOALS: Goals reviewed with patient? No  SHORT TERM GOALS: Target date: 03/01/2023  Pt will be independent with HEP to improve strength and decrease ankle pain to improve pain-free function at home and work. Baseline:  Goal status: Goal Met    LONG  TERM GOALS: Target date: 05/26/2023   Pt will increase FOTO to at least  67 to demonstrate significant improvement in function at home and work related to ankle pain  Baseline: 02/01/23: 47; 03/10/2023: 37  Goal status: ONGOING  2.  Pt will decrease worst ankle pain by at least 3 points on the NPRS in order to demonstrate clinically significant reduction in ankle pain. Baseline: 02/01/23: worst: 8/10; 03/10/2023: Worst 8/10, Goal status: ONGOING  3.  Pt will increase pain-free strength of R ankle dorsiflexion to 5/5 MMT grade in order to demonstrate improvement in strength and function  Baseline: 02/01/23: 4/5, painful, 03/10/2023: 5/5, Painless  Goal status: GOAL MET    PLAN: PT FREQUENCY: 1-2x/week  PT DURATION: 8 weeks  PLANNED INTERVENTIONS: Therapeutic exercises, Therapeutic activity, Neuromuscular re-education, Balance training, Gait training, Patient/Family education, Self Care, Joint mobilization, Joint manipulation, Vestibular training, Canalith repositioning, Orthotic/Fit training, DME instructions, Dry Needling, Electrical stimulation, Spinal manipulation, Spinal mobilization, Cryotherapy, Moist heat, Taping, Traction, Ultrasound, Ionotophoresis 4mg /ml Dexamethasone, Manual therapy, and Re-evaluation.  PLAN FOR NEXT SESSION: Progress strengthening and manual techniques, TDN to anterior tibialis, modify/review HEP as needed;   Cristal Deer Manoah Deckard SPT Barbara Cower D Huprich PT, DPT, GCS

## 2023-04-05 ENCOUNTER — Ambulatory Visit: Payer: BC Managed Care – PPO

## 2023-04-07 ENCOUNTER — Ambulatory Visit: Payer: BC Managed Care – PPO

## 2023-04-07 DIAGNOSIS — M25571 Pain in right ankle and joints of right foot: Secondary | ICD-10-CM

## 2023-04-07 DIAGNOSIS — M6281 Muscle weakness (generalized): Secondary | ICD-10-CM

## 2023-04-19 ENCOUNTER — Ambulatory Visit: Payer: BC Managed Care – PPO | Attending: Orthopaedic Surgery

## 2023-04-19 DIAGNOSIS — M25571 Pain in right ankle and joints of right foot: Secondary | ICD-10-CM | POA: Insufficient documentation

## 2023-04-19 DIAGNOSIS — M6281 Muscle weakness (generalized): Secondary | ICD-10-CM | POA: Diagnosis present

## 2023-04-19 NOTE — Therapy (Cosign Needed)
OUTPATIENT PHYSICAL THERAPY ANKLE/FOOT TREATMENT  Patient Name: [name] MRN: [id number] DOB:[birth year], 42 y.o., female Today's Date: 04/20/2023  END OF SESSION:  PT End of Session - 04/19/23 1703     Visit Number 15    Number of Visits 33    Date for PT Re-Evaluation 05/26/23    Authorization Type eval: 02/01/23    PT Start Time 1705    PT Stop Time 1745    PT Time Calculation (min) 40 min    Activity Tolerance Patient tolerated treatment well    Behavior During Therapy [facility] for tasks assessed/performed            Past Medical History:  Diagnosis Date   Anxiety    Arthritis    right ankle   Bony exostosis 09/2014   right knee   GERD (gastroesophageal reflux disease)    History of migraine    Hypertension    Neuromuscular disorder (HCC)    trigeminal nerve pain   Pain, eye, left 10/10/2014   with tingling - states possible shingles; to see PCP   Painful orthopaedic hardware (HCC) 09/2014   right ankle   Past Surgical History:  Procedure Laterality Date   BONE EXOSTOSIS EXCISION Right 10/16/2014   Procedure: EXCISION OF BONY EXOSTOSIS FROM RIGHT KNEE;  Surgeon: Tarry Kos, MD;  Location: [facility];  Service: Orthopedics;  Laterality: Right;   CHOLECYSTECTOMY  07/12/2006   DILATATION & CURRETTAGE/HYSTEROSCOPY WITH RESECTOCOPE N/A 09/24/2022   Procedure: DILATATION & CURETTAGE/HYSTEROSCOPY WITH RESECTOCOPE;  Surgeon: Maxie Better, MD;  Location: [facility];  Service: Gynecology;  Laterality: N/A;   ENDOMETRIAL ABLATION N/A 09/24/2022   Procedure: ENDOMETRIAL ABLATION;  Surgeon: Maxie Better, MD;  Location: [facility] ;  Service: Gynecology;  Laterality: N/A;   HARDWARE REMOVAL Right 10/16/2014   Procedure: HARDWARE REMOVAL RIGHT ANKLE;  Surgeon: Tarry Kos, MD;  Location: [facility];  Service: Orthopedics;  Laterality: Right;   KNEE ARTHROSCOPY Right 09/01/2007   ORIF ANKLE FRACTURE  BIMALLEOLAR Right 02/28/2007   ORIF TIBIA FRACTURE Right 02/28/2007   rod placement right   TIBIA HARDWARE REMOVAL Right 09/01/2007   There are no problems to display for this patient.  PCP: Jerl Mina MD  REFERRING PROVIDER: Gershon Mussel MD  REFERRING DIAG: [phone] (ICD-10-CM) - Pain in right ankle and joints of right foot   Rationale for Evaluation and Treatment: Rehabilitation  THERAPY DIAG: Pain in right ankle and joints of right foot  Muscle weakness (generalized)  ONSET DATE: Early 2024  FOLLOW-UP APPT SCHEDULED WITH REFERRING PROVIDER: No   From Initial Evaluation SUBJECTIVE:  SUBJECTIVE STATEMENT:  R ankle/foot pain  PERTINENT HISTORY:  Patient is a pleasant 42 year old female who was referred to physical therapy for right ankle pain. She underwent R ankle ORIF after "shattering my ankle" (trimalleolar fracture) in 2008 when she fell down stairs. She is now status post right tibial nail with subsequent hardware removal (lateral plate for fibula fracture removed) back in 2016. She still has the tibial IM nail in place. Pt was doing well until earlier this year when her R foot and ankle started to hurt. She denies any acute injury and states that the only thing she can think might have contributed to her pain was a change in her footwear. She went to [facility] where plain film radiographs showed no acute changes. She was given a home exercise program which did help however pain still remains so she was seen by Dr. Roda Shutters in [place] who referred her for physical therapy. She is now having intermittent discomfort to the R anterior/anteromedial ankle as well as pain in the distal lateral foot (ankle pain started first followed by foot pain at later date). She has pain most days and symptoms are worse  with walking as well as at the end of the day. She notes occasional anterior/anterolateral swelling in the ankle.    R ankle XR (03/01/2007) IMPRESSION:   Spiral type fracture involving the distal fibular shaft above the ankle mortise. There are also two fractures involving the tibia, one is in the mid-tibial shaft region and the other is distally.   XR Ankle Complete Right (01/05/2023) No acute fracture noted.  She does have mild degenerative changes throughout the ankle in addition to a healed fibula fracture.  No hardware complication of the tibial nail.  PAIN:    Pain Intensity: Present: 0/10, Best: 0/10, Worst: 8/10 Pain location: Anterior and anteromedial ankle "deep soreness", R foot pain over the distal 4th and 5th metatarsals (dull ache).  Pain Quality: dull and sore Radiating: No  Swelling: Yes, anterior/anteromedial ankle; Popping, catching, locking: Yes, occasional "pop" which offers pain relief; Numbness/Tingling: No Focal Weakness: Yes, some R ankle weakness reported Aggravating factors: extended standing/walking, exercising, painful walking after first waking in the morning; Relieving factors: pain medicine, rest (offloading), ice, compression stockings at night and sometimes during the day, no benefit with NSAIDs 24-hour pain behavior: Varies throughout the day. Bad especially first thing in the morning History of prior back, hip, knee, or ankle injury, pain, surgery, or therapy: Yes, history of R ankle surgery. R knee pain and swelling since her fall in 2008. She has also been having R low back pain since her injury. Her back pain occasionally radiates into the R hip but never into the knee or ankle;  Dominant hand: right Imaging: Yes, see history Typical footwear: pt has been wearing sneakers since her R foot/ankle pain started Red flags: Positive for weight gain, Negative for personal history of cancer, chills/fever, night sweats, nausea, vomiting;  PRECAUTIONS:  None  WEIGHT BEARING RESTRICTIONS: No  FALLS: Has patient fallen in last 6 months? No  Living Environment Lives with: lives with their daughter Lives in: House/apartment (two level townhome) Stairs: bedroom upstairs and pt has to occasionally Jassmine Vandruff one step at a time due to the pain;  Prior level of function: Independent  Occupational demands: Gaffer at [employer]  Hobbies: exercising, eating different foods, spending time with friends  Patient Goals: Pt would like to be able to exercise and walk her dog without pain, Malachi Kinzler  for a walk with her dog without pain    OBJECTIVE:   Patient Surveys  LEFS To be completed FOTO 47, predicted improvement to 70  Cognition Patient is oriented to person, place, and time.  Recent memory is intact.  Remote memory is intact.  Attention span and concentration are intact.  Expressive speech is intact.  Patient's fund of knowledge is within normal limits for educational level.    Gross Musculoskeletal Assessment Bulk: Normal Tone: Normal No trophic changes noted to foot/ankle. No ecchymosis, erythema, or edema noted. No gross ankle/foot deformity noted  GAIT: Full gait assessment deferred  Posture: No gross deficits in seated or standing posture which would contribute to her pain;  AROM AROM (Normal range in degrees) AROM   Hip Right Left  Flexion (125)    Extension (15)    Abduction (40)    Adduction     Internal Rotation (45)    External Rotation (45)        Knee    Flexion (135) WNL WNL  Extension (0) WNL WNL      Ankle    Dorsiflexion (20) 12 28  Plantarflexion (50) 50 42  Inversion (35) 18 30  Eversion (15) 8 18  (* = pain; Blank rows = not tested)   LE MMT: MMT (out of 5) Right  Left   Hip flexion 5 5  Hip extension    Hip abduction    Hip adduction    Hip internal rotation    Hip external rotation    Knee flexion 5 5  Knee extension 5 5  Ankle dorsiflexion 4* 5  Ankle plantarflexion  5 5  Ankle inversion 5 5  Ankle eversion 5 5  (* = pain; Blank rows = not tested)  Sensation Grossly intact to light touch throughout bilateral LEs as determined by testing dermatomes L2-S2. Proprioception, stereognosis, and hot/cold testing deferred on this date.  Reflexes Deferred  Muscle Length Deferred  Palpation Pain with palpation to anteromedial ankle near the scar from her surgery. Pain with palpation over tibialis anterior distal muscle belly and tendon. Pain to posterior tibialis tendon but no pain at navicular. Painful at base of 5th metatarsal and in space between 3rd and 4th/4th and 5th metatarsals.  Passive Accessory Motion Deferred  VASCULAR Deferred  SPECIAL TESTS Ligamentous Integrity Anterior Drawer (ATF, 10-15 plantarflexion with anterior translation): Negative Talar Tilt (CFL, inversion): Negative Eversion Stress Test (Deltoid, eversion): Negative External Rotation Test (High ankle, dorsiflexion and external rotation): Negative Squeeze Test (High ankle): Negative Impingment Sign (Dorsiflexion and eversion): Negative  Achilles Integrity Thompson Test: Negative  Fracture Screening Metatarsal Axial Loading: Negative Tap/Percussion Test: Not examined Vibration Test: Not examined  Pronation/Supination Navicular Drop: Not examined  Nerve Test Tarsal Tunnel Test (maximal DF, EV, toe ext with tapping over tarsal tunnel): Not examined Test for Morton's Neuroma (compress metatarsals and mobilize): Positive  Other Windlass Mechanism Test: Negative   Beighton Scale Deferred   TODAY'S TREATMENT   SUBJECTIVE: Patient arrives to physical therapy highly motivated. Patient with improvements in left foot; minor soreness in left peroneal muscles and lateral ankle. Patient reports pain with insole in the left shoe. No further questions or concerns.    PAIN: 4/10, Forefoot    Ther-ex: Ankle Eversion against blue Theraband 2 x 12;  Ankle Circles against  Green theraband LE 2 x 10 rep; Forward and Retro Stepping with Green Theraband around bilateral feet; 29ft/bout x 4 bouts;   BPPV Testing: Gilberto Better R:  Negative Dix Hallpike L: Negative  Left Horizontal Roll Test: Negative  Right Horizontal Roll Test: Negative    Manual Therapy: Extensive STM to peroneal muscles (left leg); myofascial release and trigger point release techniques utilized.     Not performed: Airex Pad BLE Calf Raises at Steps 2 x10, no pain reported; Banded Eversion, Green TB 2 x 10;  BLE Feet on Green and Pink balance pad, no UE support, EC 3 x 30s; R Ankle Dorsiflexion, Plantarflexion, Inversion, Eversion Isometric 2 x 10 x 10s; R ankle A/P mobilizations at available end range dorsiflexion, grade I-II, 30s/bout x 2 bouts; R ankle talocrural distraction, grade I-II, 30s/bout x 3 bouts; Extensive STM to medial, anterior, and lateral ankle as well as proximally along anterior tibialis muscle; Wall Supported Toe Raises 2 x 10 x 2s hold;   PATIENT EDUCATION:  Education details: Pt educated throughout session about proper posture and technique with exercises. Educated patient on self STM at Tibialis anterior for pain and stiffness Improved exercise technique, movement at target joints, use of target muscles after min to mod verbal, visual, tactile cues.  Person educated: Patient Education method: Medical illustrator Education comprehension: verbalized and demonstrated understanding   HOME EXERCISE PROGRAM:  Access Code: [email or web address] URL: [email or web address] Date: 02/17/2023 Prepared by: Ria Comment  Exercises - Seated Figure 4 Ankle Inversion with Resistance  - 1 x daily - 7 x weekly - 1-2 sets - 10 reps - 2-3s hold - Seated Ankle Eversion with Resistance (Mirrored)  - 1 x daily - 7 x weekly - 1-2 sets - 10 reps - 2-3s hold - Toe Raise With Back Against Wall  - 1 x daily - 7 x weekly - 1-2 sets - 10 reps - 2-3s hold (On Pause)  -  Standing Bilateral Gastroc Stretch with Step  - 1 x daily - 7 x weekly - 3 reps - 30-45s hold   ASSESSMENT:  CLINICAL IMPRESSION: Patient arrived to physical therapy highly motivated to improve ankle pain. PT educated patient on proper insole wear schedule in order to prevent change in gait or lower extremity biomechanics. She endorsed significant improvements with ankle pain and stability. Patient continues to demonstrate improvements tolerating increased resistance and intensity with foot/ankle exercises. PT screened BPPV due to patient reporting dizziness with positional changes during bed mobility. PT plans to continue strengthening ankle stabilizers in order to reduce pain. Patient will continue benefit from PT services to address deficits in strength, range of motion, and pain in order to return to full function at home, work, and with leisure activities.  OBJECTIVE IMPAIRMENTS: difficulty walking, decreased strength, and pain.   ACTIVITY LIMITATIONS: standing and stairs  PARTICIPATION LIMITATIONS: shopping, community activity, occupation, and exercise  PERSONAL FACTORS: Past/current experiences and 1-2 comorbidities: anxiety and R ankle arthritis  are also affecting patient's functional outcome.   REHAB POTENTIAL: Good  CLINICAL DECISION MAKING: Evolving/moderate complexity  EVALUATION COMPLEXITY: Moderate   GOALS: Goals reviewed with patient? No  SHORT TERM GOALS: Target date: 03/01/2023  Pt will be independent with HEP to improve strength and decrease ankle pain to improve pain-free function at home and work. Baseline:  Goal status: Goal Met    LONG TERM GOALS: Target date: 05/26/2023   Pt will increase FOTO to at least 67 to demonstrate significant improvement in function at home and work related to ankle pain  Baseline: 02/01/23: 47; 03/10/2023: 37  Goal status: ONGOING  2.  Pt will decrease worst ankle pain by at least 3  points on the NPRS in order to demonstrate  clinically significant reduction in ankle pain. Baseline: 02/01/23: worst: 8/10; 03/10/2023: Worst 8/10, Goal status: ONGOING  3.  Pt will increase pain-free strength of R ankle dorsiflexion to 5/5 MMT grade in order to demonstrate improvement in strength and function  Baseline: 02/01/23: 4/5, painful, 03/10/2023: 5/5, Painless  Goal status: GOAL MET    PLAN: PT FREQUENCY: 1-2x/week  PT DURATION: 8 weeks  PLANNED INTERVENTIONS: Therapeutic exercises, Therapeutic activity, Neuromuscular re-education, Balance training, Gait training, Patient/Family education, Self Care, Joint mobilization, Joint manipulation, Vestibular training, Canalith repositioning, Orthotic/Fit training, DME instructions, Dry Needling, Electrical stimulation, Spinal manipulation, Spinal mobilization, Cryotherapy, Moist heat, Taping, Traction, Ultrasound, Ionotophoresis 4mg /ml Dexamethasone, Manual therapy, and Re-evaluation.  PLAN FOR NEXT SESSION: Progress strengthening and manual techniques, TDN to anterior tibialis, modify/review HEP as needed;   Cristal Deer Adonis Yim SPT Barbara Cower D Huprich PT, DPT, GCS

## 2023-04-21 ENCOUNTER — Ambulatory Visit: Payer: BC Managed Care – PPO

## 2023-04-21 DIAGNOSIS — M6281 Muscle weakness (generalized): Secondary | ICD-10-CM

## 2023-04-21 DIAGNOSIS — M25571 Pain in right ankle and joints of right foot: Secondary | ICD-10-CM

## 2023-04-21 NOTE — Therapy (Addendum)
OUTPATIENT PHYSICAL THERAPY ANKLE/FOOT TREATMENT  Patient Name: [name] MRN: [id number] DOB:1981-03-16, 42 y.o., female Today's Date: 04/22/2023  END OF SESSION:  PT End of Session - 04/21/23 1651     Visit Number 16    Number of Visits 33    Date for PT Re-Evaluation 05/26/23    Authorization Type eval: 02/01/23    PT Start Time 1650    PT Stop Time 1735    PT Time Calculation (min) 45 min    Activity Tolerance Patient tolerated treatment well    Behavior During Therapy [facility] for tasks assessed/performed            Past Medical History:  Diagnosis Date   Anxiety    Arthritis    right ankle   Bony exostosis 09/2014   right knee   GERD (gastroesophageal reflux disease)    History of migraine    Hypertension    Neuromuscular disorder (HCC)    trigeminal nerve pain   Pain, eye, left 10/10/2014   with tingling - states possible shingles; to see PCP   Painful orthopaedic hardware (HCC) 09/2014   right ankle   Past Surgical History:  Procedure Laterality Date   BONE EXOSTOSIS EXCISION Right 10/16/2014   Procedure: EXCISION OF BONY EXOSTOSIS FROM RIGHT KNEE;  Surgeon: Tarry Kos, MD;  Location: [facility];  Service: Orthopedics;  Laterality: Right;   CHOLECYSTECTOMY  07/12/2006   DILATATION & CURRETTAGE/HYSTEROSCOPY WITH RESECTOCOPE N/A 09/24/2022   Procedure: DILATATION & CURETTAGE/HYSTEROSCOPY WITH RESECTOCOPE;  Surgeon: Maxie Better, MD;  Location: [facility];  Service: Gynecology;  Laterality: N/A;   ENDOMETRIAL ABLATION N/A 09/24/2022   Procedure: ENDOMETRIAL ABLATION;  Surgeon: Maxie Better, MD;  Location: [facility];  Service: Gynecology;  Laterality: N/A;   HARDWARE REMOVAL Right 10/16/2014   Procedure: HARDWARE REMOVAL RIGHT ANKLE;  Surgeon: Tarry Kos, MD;  Location: [facility];  Service: Orthopedics;  Laterality: Right;   KNEE ARTHROSCOPY Right 09/01/2007   ORIF ANKLE  FRACTURE BIMALLEOLAR Right 02/28/2007   ORIF TIBIA FRACTURE Right 02/28/2007   rod placement right   TIBIA HARDWARE REMOVAL Right 09/01/2007   There are no problems to display for this patient.  PCP: Jerl Mina MD  REFERRING PROVIDER: Gershon Mussel MD  REFERRING DIAG: [phone] (ICD-10-CM) - Pain in right ankle and joints of right foot   Rationale for Evaluation and Treatment: Rehabilitation  THERAPY DIAG: Pain in right ankle and joints of right foot  Muscle weakness (generalized)  ONSET DATE: Early 2024  FOLLOW-UP APPT SCHEDULED WITH REFERRING PROVIDER: No   From Initial Evaluation SUBJECTIVE:  SUBJECTIVE STATEMENT:  R ankle/foot pain  PERTINENT HISTORY:  Patient is a pleasant 42 year old female who was referred to physical therapy for right ankle pain. She underwent R ankle ORIF after "shattering my ankle" (trimalleolar fracture) in 2008 when she fell down stairs. She is now status post right tibial nail with subsequent hardware removal (lateral plate for fibula fracture removed) back in 2016. She still has the tibial IM nail in place. Pt was doing well until earlier this year when her R foot and ankle started to hurt. She denies any acute injury and states that the only thing she can think might have contributed to her pain was a change in her footwear. She went to [facility] where plain film radiographs showed no acute changes. She was given a home exercise program which did help however pain still remains so she was seen by Dr. Roda Shutters in [place] who referred her for physical therapy. She is now having intermittent discomfort to the R anterior/anteromedial ankle as well as pain in the distal lateral foot (ankle pain started first followed by foot pain at later date). She has pain most days and symptoms  are worse with walking as well as at the end of the day. She notes occasional anterior/anterolateral swelling in the ankle.    R ankle XR (03/01/2007) IMPRESSION:   Spiral type fracture involving the distal fibular shaft above the ankle mortise. There are also two fractures involving the tibia, one is in the mid-tibial shaft region and the other is distally.   XR Ankle Complete Right (01/05/2023) No acute fracture noted.  She does have mild degenerative changes throughout the ankle in addition to a healed fibula fracture.  No hardware complication of the tibial nail.  PAIN:    Pain Intensity: Present: 0/10, Best: 0/10, Worst: 8/10 Pain location: Anterior and anteromedial ankle "deep soreness", R foot pain over the distal 4th and 5th metatarsals (dull ache).  Pain Quality: dull and sore Radiating: No  Swelling: Yes, anterior/anteromedial ankle; Popping, catching, locking: Yes, occasional "pop" which offers pain relief; Numbness/Tingling: No Focal Weakness: Yes, some R ankle weakness reported Aggravating factors: extended standing/walking, exercising, painful walking after first waking in the morning; Relieving factors: pain medicine, rest (offloading), ice, compression stockings at night and sometimes during the day, no benefit with NSAIDs 24-hour pain behavior: Varies throughout the day. Bad especially first thing in the morning History of prior back, hip, knee, or ankle injury, pain, surgery, or therapy: Yes, history of R ankle surgery. R knee pain and swelling since her fall in 2008. She has also been having R low back pain since her injury. Her back pain occasionally radiates into the R hip but never into the knee or ankle;  Dominant hand: right Imaging: Yes, see history Typical footwear: pt has been wearing sneakers since her R foot/ankle pain started Red flags: Positive for weight gain, Negative for personal history of cancer, chills/fever, night sweats, nausea, vomiting;  PRECAUTIONS:  None  WEIGHT BEARING RESTRICTIONS: No  FALLS: Has patient fallen in last 6 months? No  Living Environment Lives with: lives with their daughter Lives in: House/apartment (two level townhome) Stairs: bedroom upstairs and pt has to occasionally Isela Stantz one step at a time due to the pain;  Prior level of function: Independent  Occupational demands: Gaffer at [employer]  Hobbies: exercising, eating different foods, spending time with friends  Patient Goals: Pt would like to be able to exercise and walk her dog without pain, Orlyn Odonoghue  for a walk with her dog without pain    OBJECTIVE:   Patient Surveys  LEFS To be completed FOTO 47, predicted improvement to 68  Cognition Patient is oriented to person, place, and time.  Recent memory is intact.  Remote memory is intact.  Attention span and concentration are intact.  Expressive speech is intact.  Patient's fund of knowledge is within normal limits for educational level.    Gross Musculoskeletal Assessment Bulk: Normal Tone: Normal No trophic changes noted to foot/ankle. No ecchymosis, erythema, or edema noted. No gross ankle/foot deformity noted  GAIT: Full gait assessment deferred  Posture: No gross deficits in seated or standing posture which would contribute to her pain;  AROM AROM (Normal range in degrees) AROM   Hip Right Left  Flexion (125)    Extension (15)    Abduction (40)    Adduction     Internal Rotation (45)    External Rotation (45)        Knee    Flexion (135) WNL WNL  Extension (0) WNL WNL      Ankle    Dorsiflexion (20) 12 28  Plantarflexion (50) 50 42  Inversion (35) 18 30  Eversion (15) 8 18  (* = pain; Blank rows = not tested)   LE MMT: MMT (out of 5) Right  Left   Hip flexion 5 5  Hip extension    Hip abduction    Hip adduction    Hip internal rotation    Hip external rotation    Knee flexion 5 5  Knee extension 5 5  Ankle dorsiflexion 4* 5  Ankle plantarflexion  5 5  Ankle inversion 5 5  Ankle eversion 5 5  (* = pain; Blank rows = not tested)  Sensation Grossly intact to light touch throughout bilateral LEs as determined by testing dermatomes L2-S2. Proprioception, stereognosis, and hot/cold testing deferred on this date.  Reflexes Deferred  Muscle Length Deferred  Palpation Pain with palpation to anteromedial ankle near the scar from her surgery. Pain with palpation over tibialis anterior distal muscle belly and tendon. Pain to posterior tibialis tendon but no pain at navicular. Painful at base of 5th metatarsal and in space between 3rd and 4th/4th and 5th metatarsals.  Passive Accessory Motion Deferred  VASCULAR Deferred  SPECIAL TESTS Ligamentous Integrity Anterior Drawer (ATF, 10-15 plantarflexion with anterior translation): Negative Talar Tilt (CFL, inversion): Negative Eversion Stress Test (Deltoid, eversion): Negative External Rotation Test (High ankle, dorsiflexion and external rotation): Negative Squeeze Test (High ankle): Negative Impingment Sign (Dorsiflexion and eversion): Negative  Achilles Integrity Thompson Test: Negative  Fracture Screening Metatarsal Axial Loading: Negative Tap/Percussion Test: Not examined Vibration Test: Not examined  Pronation/Supination Navicular Drop: Not examined  Nerve Test Tarsal Tunnel Test (maximal DF, EV, toe ext with tapping over tarsal tunnel): Not examined Test for Morton's Neuroma (compress metatarsals and mobilize): Positive  Other Windlass Mechanism Test: Negative   Beighton Scale Deferred   TODAY'S TREATMENT   SUBJECTIVE: Patient reports to physical therapy with no reports. Pt reports continued forefoot pain.  No further questions or concerns.    PAIN: 6/10 Forefoot    Ther-ex: R Ankle Dorsiflexion,  Inversion, Eversion 1 x 12;  Bilateral Ankle Circles against Green theraband 1 x 12 rotations;  Heel Raises on Airex Pad 1 x 12;  Heel Raises with Green TB  around ankles 1 x 12;  Tandem Stepping on Airex Beam in // bars x 2 trials;  Ankle Rocker Dorsiflexion and  Plantarflexion direction 2 x 12 (second set with cone reaching task to challenge balance);  Reviewed and modified HEP;   Not performed: Airex Pad BLE Calf Raises at Steps 2 x10, no pain reported; Banded Eversion, Green TB 2 x 10;  BLE Feet on Green and Pink balance pad, no UE support, EC 3 x 30s; R Ankle Dorsiflexion, Plantarflexion, Inversion, Eversion Isometric 2 x 10 x 10s; R ankle A/P mobilizations at available end range dorsiflexion, grade I-II, 30s/bout x 2 bouts; R ankle talocrural distraction, grade I-II, 30s/bout x 3 bouts; Extensive STM to medial, anterior, and lateral ankle as well as proximally along anterior tibialis muscle; Wall Supported Toe Raises 2 x 10 x 2s hold;   PATIENT EDUCATION:  Education details: Pt educated throughout session about proper posture and technique with exercises. Educated patient on self STM at Tibialis anterior for pain and stiffness Improved exercise technique, movement at target joints, use of target muscles after min to mod verbal, visual, tactile cues.  Person educated: Patient Education method: Medical illustrator Education comprehension: verbalized and demonstrated understanding   HOME EXERCISE PROGRAM:  Access Code: [email or web address] URL: [email or web address] Date: 02/17/2023 Prepared by: Ria Comment  Exercises - Seated Figure 4 Ankle Inversion with Resistance  - 1 x daily - 7 x weekly - 1-2 sets - 10 reps - 2-3s hold - Seated Ankle Eversion with Resistance (Mirrored)  - 1 x daily - 7 x weekly - 1-2 sets - 10 reps - 2-3s hold - Toe Raise With Back Against Wall  - 1 x daily - 7 x weekly - 1-2 sets - 10 reps - 2-3s hold (On Pause)  - Standing Bilateral Gastroc Stretch with Step  - 1 x daily - 7 x weekly - 3 reps - 30-45s hold   ASSESSMENT:  CLINICAL IMPRESSION: Patient arrived to physical therapy highly  motivated to improve ankle pain. PT educated patient on proper insole wear schedule in order to prevent change in gait or lower extremity biomechanics. She endorsed significant improvements with ankle pain and stability. Patient continues to demonstrate improvements tolerating increased resistance and intensity with foot/ankle exercises. PT screened BPPV due to patient reporting dizziness with positional changes during bed mobility. PT plans to continue strengthening ankle stabilizers in order to reduce pain. Patient will continue benefit from PT services to address deficits in strength, range of motion, and pain in order to return to full function at home, work, and with leisure activities.  OBJECTIVE IMPAIRMENTS: difficulty walking, decreased strength, and pain.   ACTIVITY LIMITATIONS: standing and stairs  PARTICIPATION LIMITATIONS: shopping, community activity, occupation, and exercise  PERSONAL FACTORS: Past/current experiences and 1-2 comorbidities: anxiety and R ankle arthritis  are also affecting patient's functional outcome.   REHAB POTENTIAL: Good  CLINICAL DECISION MAKING: Evolving/moderate complexity  EVALUATION COMPLEXITY: Moderate   GOALS: Goals reviewed with patient? No  SHORT TERM GOALS: Target date: 03/01/2023  Pt will be independent with HEP to improve strength and decrease ankle pain to improve pain-free function at home and work. Baseline:  Goal status: Goal Met    LONG TERM GOALS: Target date: 05/26/2023   Pt will increase FOTO to at least 67 to demonstrate significant improvement in function at home and work related to ankle pain  Baseline: 02/01/23: 47; 03/10/2023: 37  Goal status: ONGOING  2.  Pt will decrease worst ankle pain by at least 3 points on the NPRS in order to demonstrate clinically significant reduction in ankle pain. Baseline: 02/01/23: worst: 8/10; 03/10/2023: Worst  8/10, 04/21/2023: 4/10 Goal status: ONGOING  3.  Pt will increase pain-free  strength of R ankle dorsiflexion to 5/5 MMT grade in order to demonstrate improvement in strength and function  Baseline: 02/01/23: 4/5, painful, 03/10/2023: 5/5, Painless  Goal status: GOAL MET    PLAN: PT FREQUENCY: 1-2x/week  PT DURATION: 8 weeks  PLANNED INTERVENTIONS: Therapeutic exercises, Therapeutic activity, Neuromuscular re-education, Balance training, Gait training, Patient/Family education, Self Care, Joint mobilization, Joint manipulation, Vestibular training, Canalith repositioning, Orthotic/Fit training, DME instructions, Dry Needling, Electrical stimulation, Spinal manipulation, Spinal mobilization, Cryotherapy, Moist heat, Taping, Traction, Ultrasound, Ionotophoresis 4mg /ml Dexamethasone, Manual therapy, and Re-evaluation.  PLAN FOR NEXT SESSION: Progress strengthening and manual techniques, TDN to anterior tibialis, modify/review HEP as needed;   Cristal Deer Sheng Pritz SPT Barbara Cower D Huprich PT, DPT, GCS

## 2023-04-26 ENCOUNTER — Ambulatory Visit: Payer: BC Managed Care – PPO

## 2023-04-26 DIAGNOSIS — M25571 Pain in right ankle and joints of right foot: Secondary | ICD-10-CM

## 2023-04-26 DIAGNOSIS — M6281 Muscle weakness (generalized): Secondary | ICD-10-CM

## 2023-04-26 NOTE — Therapy (Incomplete)
OUTPATIENT PHYSICAL THERAPY ANKLE/FOOT TREATMENT  Patient Name: KURSTIN DIMARZO MRN: [id number] DOB:11/14/1980, 42 y.o., female Today's Date: 04/27/2023  END OF SESSION:  PT End of Session - 04/26/23 1659     Visit Number 17    Number of Visits 33    Date for PT Re-Evaluation 05/26/23    Authorization Type eval: 02/01/23    PT Start Time 1700    PT Stop Time 1740    PT Time Calculation (min) 40 min    Activity Tolerance Patient tolerated treatment well    Behavior During Therapy [facility] for tasks assessed/performed            Past Medical History:  Diagnosis Date   Anxiety    Arthritis    right ankle   Bony exostosis 09/2014   right knee   GERD (gastroesophageal reflux disease)    History of migraine    Hypertension    Neuromuscular disorder (HCC)    trigeminal nerve pain   Pain, eye, left 10/10/2014   with tingling - states possible shingles; to see PCP   Painful orthopaedic hardware (HCC) 09/2014   right ankle   Past Surgical History:  Procedure Laterality Date   BONE EXOSTOSIS EXCISION Right 10/16/2014   Procedure: EXCISION OF BONY EXOSTOSIS FROM RIGHT KNEE;  Surgeon: Tarry Kos, MD;  Location: [facility];  Service: Orthopedics;  Laterality: Right;   CHOLECYSTECTOMY  07/12/2006   DILATATION & CURRETTAGE/HYSTEROSCOPY WITH RESECTOCOPE N/A 09/24/2022   Procedure: DILATATION & CURETTAGE/HYSTEROSCOPY WITH RESECTOCOPE;  Surgeon: Maxie Better, MD;  Location: [facility] [place];  Service: Gynecology;  Laterality: N/A;   ENDOMETRIAL ABLATION N/A 09/24/2022   Procedure: ENDOMETRIAL ABLATION;  Surgeon: Maxie Better, MD;  Location: [facility] [place];  Service: Gynecology;  Laterality: N/A;   HARDWARE REMOVAL Right 10/16/2014   Procedure: HARDWARE REMOVAL RIGHT ANKLE;  Surgeon: Tarry Kos, MD;  Location: [facility];  Service: Orthopedics;  Laterality: Right;   KNEE ARTHROSCOPY Right 09/01/2007   ORIF ANKLE  FRACTURE BIMALLEOLAR Right 02/28/2007   ORIF TIBIA FRACTURE Right 02/28/2007   rod placement right   TIBIA HARDWARE REMOVAL Right 09/01/2007   There are no problems to display for this patient.  PCP: Jerl Mina MD  REFERRING PROVIDER: Gershon Mussel MD  REFERRING DIAG: [phone] (ICD-10-CM) - Pain in right ankle and joints of right foot   Rationale for Evaluation and Treatment: Rehabilitation  THERAPY DIAG: Pain in right ankle and joints of right foot  Muscle weakness (generalized)  ONSET DATE: Early 2024  FOLLOW-UP APPT SCHEDULED WITH REFERRING PROVIDER: No   From Initial Evaluation SUBJECTIVE:  SUBJECTIVE STATEMENT:  R ankle/foot pain  PERTINENT HISTORY:  Patient is a pleasant 42 year old female who was referred to physical therapy for right ankle pain. She underwent R ankle ORIF after "shattering my ankle" (trimalleolar fracture) in 2008 when she fell down stairs. She is now status post right tibial nail with subsequent hardware removal (lateral plate for fibula fracture removed) back in 2016. She still has the tibial IM nail in place. Pt was doing well until earlier this year when her R foot and ankle started to hurt. She denies any acute injury and states that the only thing she can think might have contributed to her pain was a change in her footwear. She went to [facility] where plain film radiographs showed no acute changes. She was given a home exercise program which did help however pain still remains so she was seen by Dr. Roda Shutters in [place] who referred her for physical therapy. She is now having intermittent discomfort to the R anterior/anteromedial ankle as well as pain in the distal lateral foot (ankle pain started first followed by foot pain at later date). She has pain most days and symptoms  are worse with walking as well as at the end of the day. She notes occasional anterior/anterolateral swelling in the ankle.    R ankle XR (03/01/2007) IMPRESSION:   Spiral type fracture involving the distal fibular shaft above the ankle mortise. There are also two fractures involving the tibia, one is in the mid-tibial shaft region and the other is distally.   XR Ankle Complete Right (01/05/2023) No acute fracture noted.  She does have mild degenerative changes throughout the ankle in addition to a healed fibula fracture.  No hardware complication of the tibial nail.  PAIN:    Pain Intensity: Present: 0/10, Best: 0/10, Worst: 8/10 Pain location: Anterior and anteromedial ankle "deep soreness", R foot pain over the distal 4th and 5th metatarsals (dull ache).  Pain Quality: dull and sore Radiating: No  Swelling: Yes, anterior/anteromedial ankle; Popping, catching, locking: Yes, occasional "pop" which offers pain relief; Numbness/Tingling: No Focal Weakness: Yes, some R ankle weakness reported Aggravating factors: extended standing/walking, exercising, painful walking after first waking in the morning; Relieving factors: pain medicine, rest (offloading), ice, compression stockings at night and sometimes during the day, no benefit with NSAIDs 24-hour pain behavior: Varies throughout the day. Bad especially first thing in the morning History of prior back, hip, knee, or ankle injury, pain, surgery, or therapy: Yes, history of R ankle surgery. R knee pain and swelling since her fall in 2008. She has also been having R low back pain since her injury. Her back pain occasionally radiates into the R hip but never into the knee or ankle;  Dominant hand: right Imaging: Yes, see history Typical footwear: pt has been wearing sneakers since her R foot/ankle pain started Red flags: Positive for weight gain, Negative for personal history of cancer, chills/fever, night sweats, nausea, vomiting;  PRECAUTIONS:  None  WEIGHT BEARING RESTRICTIONS: No  FALLS: Has patient fallen in last 6 months? No  Living Environment Lives with: lives with their daughter Lives in: House/apartment (two level townhome) Stairs: bedroom upstairs and pt has to occasionally Amiylah Anastos one step at a time due to the pain;  Prior level of function: Independent  Occupational demands: Gaffer at [employer]  Hobbies: exercising, eating different foods, spending time with friends  Patient Goals: Pt would like to be able to exercise and walk her dog without pain, Yovany Clock  for a walk with her dog without pain    OBJECTIVE:   Patient Surveys  LEFS To be completed FOTO 47, predicted improvement to 46  Cognition Patient is oriented to person, place, and time.  Recent memory is intact.  Remote memory is intact.  Attention span and concentration are intact.  Expressive speech is intact.  Patient's fund of knowledge is within normal limits for educational level.    Gross Musculoskeletal Assessment Bulk: Normal Tone: Normal No trophic changes noted to foot/ankle. No ecchymosis, erythema, or edema noted. No gross ankle/foot deformity noted  GAIT: Full gait assessment deferred  Posture: No gross deficits in seated or standing posture which would contribute to her pain;  AROM AROM (Normal range in degrees) AROM   Hip Right Left  Flexion (125)    Extension (15)    Abduction (40)    Adduction     Internal Rotation (45)    External Rotation (45)        Knee    Flexion (135) WNL WNL  Extension (0) WNL WNL      Ankle    Dorsiflexion (20) 12 28  Plantarflexion (50) 50 42  Inversion (35) 18 30  Eversion (15) 8 18  (* = pain; Blank rows = not tested)   LE MMT: MMT (out of 5) Right  Left   Hip flexion 5 5  Hip extension    Hip abduction    Hip adduction    Hip internal rotation    Hip external rotation    Knee flexion 5 5  Knee extension 5 5  Ankle dorsiflexion 4* 5  Ankle plantarflexion  5 5  Ankle inversion 5 5  Ankle eversion 5 5  (* = pain; Blank rows = not tested)  Sensation Grossly intact to light touch throughout bilateral LEs as determined by testing dermatomes L2-S2. Proprioception, stereognosis, and hot/cold testing deferred on this date.  Reflexes Deferred  Muscle Length Deferred  Palpation Pain with palpation to anteromedial ankle near the scar from her surgery. Pain with palpation over tibialis anterior distal muscle belly and tendon. Pain to posterior tibialis tendon but no pain at navicular. Painful at base of 5th metatarsal and in space between 3rd and 4th/4th and 5th metatarsals.  Passive Accessory Motion Deferred  VASCULAR Deferred  SPECIAL TESTS Ligamentous Integrity Anterior Drawer (ATF, 10-15 plantarflexion with anterior translation): Negative Talar Tilt (CFL, inversion): Negative Eversion Stress Test (Deltoid, eversion): Negative External Rotation Test (High ankle, dorsiflexion and external rotation): Negative Squeeze Test (High ankle): Negative Impingment Sign (Dorsiflexion and eversion): Negative  Achilles Integrity Thompson Test: Negative  Fracture Screening Metatarsal Axial Loading: Negative Tap/Percussion Test: Not examined Vibration Test: Not examined  Pronation/Supination Navicular Drop: Not examined  Nerve Test Tarsal Tunnel Test (maximal DF, EV, toe ext with tapping over tarsal tunnel): Not examined Test for Morton's Neuroma (compress metatarsals and mobilize): Positive  Other Windlass Mechanism Test: Negative   Beighton Scale Deferred   TODAY'S TREATMENT   SUBJECTIVE: Patient reports to physical therapy with no new reports of ankle pain. Pt reported continued forefoot pain.  No further questions or concerns.    PAIN: 9/10 Forefoot     Ther-ex: R Ankle Dorsiflexion,  Inversion, Eversion 1 x 12;  R Ankle Plantarflexion 1 x 12; R Ankle Circles against Green theraband 1 x 12 rotations; Tandem Stepping on  Airex Beam in // bars x 2 trials;  Single Leg Stance (Pain across forefoot) on 1 x 12 Dorsiflexion and Plantarflexion [place]  on [employer] 2 x 12 BLE ;  Single Leg Stance on Airex Pad 10s/bout x 5 bout  (pain with RLE after two bouts)   Not performed: Airex Pad BLE Calf Raises at Steps 2 x10, no pain reported; Banded Eversion, Green TB 2 x 10;  BLE Feet on Green and Pink balance pad, no UE support, EC 3 x 30s; R Ankle Dorsiflexion, Plantarflexion, Inversion, Eversion Isometric 2 x 10 x 10s; R ankle A/P mobilizations at available end range dorsiflexion, grade I-II, 30s/bout x 2 bouts; R ankle talocrural distraction, grade I-II, 30s/bout x 3 bouts; Extensive STM to medial, anterior, and lateral ankle as well as proximally along anterior tibialis muscle; Wall Supported Toe Raises 2 x 10 x 2s hold;   PATIENT EDUCATION:  Education details: Pt educated throughout session about proper posture and technique with exercises. Educated patient on self STM at Tibialis anterior for pain and stiffness Improved exercise technique, movement at target joints, use of target muscles after min to mod verbal, visual, tactile cues.  Person educated: Patient Education method: Medical illustrator Education comprehension: verbalized and demonstrated understanding   HOME EXERCISE PROGRAM:  Access Code: [email or web address] URL: [email or web address] Date: 02/17/2023 Prepared by: Ria Comment  Exercises - Seated Figure 4 Ankle Inversion with Resistance  - 1 x daily - 7 x weekly - 1-2 sets - 10 reps - 2-3s hold - Seated Ankle Eversion with Resistance (Mirrored)  - 1 x daily - 7 x weekly - 1-2 sets - 10 reps - 2-3s hold - Toe Raise With Back Against Wall  - 1 x daily - 7 x weekly - 1-2 sets - 10 reps - 2-3s hold (On Pause)  - Standing Bilateral Gastroc Stretch with Step  - 1 x daily - 7 x weekly - 3 reps - 30-45s hold   ASSESSMENT:  CLINICAL IMPRESSION: Patient arrived to physical  therapy highly motivated to improve ankle pain. PT educated patient on proper insole wear schedule in order to prevent change in gait or lower extremity biomechanics. She endorsed significant improvements with ankle pain and stability. Interventions in today's session limited due to forefoot pain with weight bearing activities. Although the patient has demonstrated significant improvements with ankle strength and stability she continues to be limited with functional activity due to forefoot pain. PT plans to continue strengthening ankle stabilizers in order to reduce pain. Patient will continue benefit from PT services to address deficits in strength, range of motion, and pain in order to return to full function at home, work, and with leisure activities.  OBJECTIVE IMPAIRMENTS: difficulty walking, decreased strength, and pain.   ACTIVITY LIMITATIONS: standing and stairs  PARTICIPATION LIMITATIONS: shopping, community activity, occupation, and exercise  PERSONAL FACTORS: Past/current experiences and 1-2 comorbidities: anxiety and R ankle arthritis  are also affecting patient's functional outcome.   REHAB POTENTIAL: Good  CLINICAL DECISION MAKING: Evolving/moderate complexity  EVALUATION COMPLEXITY: Moderate   GOALS: Goals reviewed with patient? No  SHORT TERM GOALS: Target date: 03/01/2023  Pt will be independent with HEP to improve strength and decrease ankle pain to improve pain-free function at home and work. Baseline:  Goal status: Goal Met    LONG TERM GOALS: Target date: 05/26/2023   Pt will increase FOTO to at least 67 to demonstrate significant improvement in function at home and work related to ankle pain  Baseline: 02/01/23: 47; 03/10/2023: 37  Goal status: ONGOING  2.  Pt will decrease worst ankle pain by at least 3 points  on the NPRS in order to demonstrate clinically significant reduction in ankle pain. Baseline: 02/01/23: worst: 8/10; 03/10/2023: Worst 8/10, 04/21/2023:  4/10 Goal status: ONGOING  3.  Pt will increase pain-free strength of R ankle dorsiflexion to 5/5 MMT grade in order to demonstrate improvement in strength and function  Baseline: 02/01/23: 4/5, painful, 03/10/2023: 5/5, Painless  Goal status: GOAL MET    PLAN: PT FREQUENCY: 1-2x/week  PT DURATION: 8 weeks  PLANNED INTERVENTIONS: Therapeutic exercises, Therapeutic activity, Neuromuscular re-education, Balance training, Gait training, Patient/Family education, Self Care, Joint mobilization, Joint manipulation, Vestibular training, Canalith repositioning, Orthotic/Fit training, DME instructions, Dry Needling, Electrical stimulation, Spinal manipulation, Spinal mobilization, Cryotherapy, Moist heat, Taping, Traction, Ultrasound, Ionotophoresis 4mg /ml Dexamethasone, Manual therapy, and Re-evaluation.  PLAN FOR NEXT SESSION: Progress strengthening and manual techniques, TDN to anterior tibialis, modify/review HEP as needed;   Cristal Deer Yesly Gerety SPT Sharalyn Ink Huprich PT, DPT, GCS  04/27/2023 12:08 PM

## 2023-04-28 ENCOUNTER — Ambulatory Visit: Payer: BC Managed Care – PPO

## 2023-04-28 DIAGNOSIS — M25571 Pain in right ankle and joints of right foot: Secondary | ICD-10-CM | POA: Diagnosis not present

## 2023-04-28 DIAGNOSIS — M6281 Muscle weakness (generalized): Secondary | ICD-10-CM

## 2023-04-28 NOTE — Therapy (Addendum)
OUTPATIENT PHYSICAL THERAPY ANKLE/FOOT TREATMENT  Patient Name: [name] MRN: [id number] DOB:April 01, 1981, 42 y.o., female Today's Date: 04/29/2023  END OF SESSION:  PT End of Session - 04/28/23 1703     Visit Number 18    Number of Visits 33    Date for PT Re-Evaluation 05/26/23    Authorization Type eval: 02/01/23    PT Start Time 1702    PT Stop Time 1745    PT Time Calculation (min) 43 min    Activity Tolerance Patient tolerated treatment well    Behavior During Therapy [name] for tasks assessed/performed            Past Medical History:  Diagnosis Date   Anxiety    Arthritis    right ankle   Bony exostosis 09/2014   right knee   GERD (gastroesophageal reflux disease)    History of migraine    Hypertension    Neuromuscular disorder (HCC)    trigeminal nerve pain   Pain, eye, left 10/10/2014   with tingling - states possible shingles; to see PCP   Painful orthopaedic hardware (HCC) 09/2014   right ankle   Past Surgical History:  Procedure Laterality Date   BONE EXOSTOSIS EXCISION Right 10/16/2014   Procedure: EXCISION OF BONY EXOSTOSIS FROM RIGHT KNEE;  Surgeon: Robin Kos, MD;  Location: [facility];  Service: Orthopedics;  Laterality: Right;   CHOLECYSTECTOMY  07/12/2006   DILATATION & CURRETTAGE/HYSTEROSCOPY WITH RESECTOCOPE N/A 09/24/2022   Procedure: DILATATION & CURETTAGE/HYSTEROSCOPY WITH RESECTOCOPE;  Surgeon: Robin Better, MD;  Location: [facility];  Service: Gynecology;  Laterality: N/A;   ENDOMETRIAL ABLATION N/A 09/24/2022   Procedure: ENDOMETRIAL ABLATION;  Surgeon: Robin Better, MD;  Location: [facility];  Service: Gynecology;  Laterality: N/A;   HARDWARE REMOVAL Right 10/16/2014   Procedure: HARDWARE REMOVAL RIGHT ANKLE;  Surgeon: Robin Kos, MD;  Location: [facility];  Service: Orthopedics;  Laterality: Right;   KNEE ARTHROSCOPY Right 09/01/2007   ORIF ANKLE  FRACTURE BIMALLEOLAR Right 02/28/2007   ORIF TIBIA FRACTURE Right 02/28/2007   rod placement right   TIBIA HARDWARE REMOVAL Right 09/01/2007   There are no problems to display for this patient.  PCP: Robin Mina MD  REFERRING PROVIDER: Gershon Mussel MD  REFERRING DIAG: [phone] (ICD-10-CM) - Pain in right ankle and joints of right foot   Rationale for Evaluation and Treatment: Rehabilitation  THERAPY DIAG: Pain in right ankle and joints of right foot  Muscle weakness (generalized)  ONSET DATE: Early 2024  FOLLOW-UP APPT SCHEDULED WITH REFERRING PROVIDER: No   From Initial Evaluation SUBJECTIVE:  SUBJECTIVE STATEMENT:  R ankle/foot pain  PERTINENT HISTORY:  Patient is a pleasant 42 year old female who was referred to physical therapy for right ankle pain. She underwent R ankle ORIF after "shattering my ankle" (trimalleolar fracture) in 2008 when she fell down stairs. She is now status post right tibial nail with subsequent hardware removal (lateral plate for fibula fracture removed) back in 2016. She still has the tibial IM nail in place. Pt was doing well until earlier this year when her R foot and ankle started to hurt. She denies any acute injury and states that the only thing she can think might have contributed to her pain was a change in her footwear. She went to [facility] where plain film radiographs showed no acute changes. She was given a home exercise program which did help however pain still remains so she was seen by Dr. Roda Long in [place] who referred her for physical therapy. She is now having intermittent discomfort to the R anterior/anteromedial ankle as well as pain in the distal lateral foot (ankle pain started first followed by foot pain at later date). She has pain most days and symptoms  are worse with walking as well as at the end of the day. She notes occasional anterior/anterolateral swelling in the ankle.    R ankle XR (03/01/2007) IMPRESSION:   Spiral type fracture involving the distal fibular shaft above the ankle mortise. There are also two fractures involving the tibia, one is in the mid-tibial shaft region and the other is distally.   XR Ankle Complete Right (01/05/2023) No acute fracture noted.  She does have mild degenerative changes throughout the ankle in addition to a healed fibula fracture.  No hardware complication of the tibial nail.  PAIN:    Pain Intensity: Present: 0/10, Best: 0/10, Worst: 8/10 Pain location: Anterior and anteromedial ankle "deep soreness", R foot pain over the distal 4th and 5th metatarsals (dull ache).  Pain Quality: dull and sore Radiating: No  Swelling: Yes, anterior/anteromedial ankle; Popping, catching, locking: Yes, occasional "pop" which offers pain relief; Numbness/Tingling: No Focal Weakness: Yes, some R ankle weakness reported Aggravating factors: extended standing/walking, exercising, painful walking after first waking in the morning; Relieving factors: pain medicine, rest (offloading), ice, compression stockings at night and sometimes during the day, no benefit with NSAIDs 24-hour pain behavior: Varies throughout the day. Bad especially first thing in the morning History of prior back, hip, knee, or ankle injury, pain, surgery, or therapy: Yes, history of R ankle surgery. R knee pain and swelling since her fall in 2008. She has also been having R low back pain since her injury. Her back pain occasionally radiates into the R hip but never into the knee or ankle;  Dominant hand: right Imaging: Yes, see history Typical footwear: pt has been wearing sneakers since her R foot/ankle pain started Red flags: Positive for weight gain, Negative for personal history of cancer, chills/fever, night sweats, nausea, vomiting;  PRECAUTIONS:  None  WEIGHT BEARING RESTRICTIONS: No  FALLS: Has patient fallen in last 6 months? No  Living Environment Lives with: lives with their daughter Lives in: House/apartment (two level townhome) Stairs: bedroom upstairs and pt has to occasionally [name] one step at a time due to the pain;  Prior level of function: Independent  Occupational demands: Robin at [employer]  Hobbies: exercising, eating different foods, spending time with friends  Patient Goals: Pt would like to be able to exercise and walk her dog without pain, Tashonna Descoteaux  for a walk with her dog without pain    OBJECTIVE:   Patient Surveys  LEFS To be completed FOTO 47, predicted improvement to 18  Cognition Patient is oriented to person, place, and time.  Recent memory is intact.  Remote memory is intact.  Attention span and concentration are intact.  Expressive speech is intact.  Patient's fund of knowledge is within normal limits for educational level.    Gross Musculoskeletal Assessment Bulk: Normal Tone: Normal No trophic changes noted to foot/ankle. No ecchymosis, erythema, or edema noted. No gross ankle/foot deformity noted  GAIT: Full gait assessment deferred  Posture: No gross deficits in seated or standing posture which would contribute to her pain;  AROM AROM (Normal range in degrees) AROM   Hip Right Left  Flexion (125)    Extension (15)    Abduction (40)    Adduction     Internal Rotation (45)    External Rotation (45)        Knee    Flexion (135) WNL WNL  Extension (0) WNL WNL      Ankle    Dorsiflexion (20) 12 28  Plantarflexion (50) 50 42  Inversion (35) 18 30  Eversion (15) 8 18  (* = pain; Blank rows = not tested)   LE MMT: MMT (out of 5) Right  Left   Hip flexion 5 5  Hip extension    Hip abduction    Hip adduction    Hip internal rotation    Hip external rotation    Knee flexion 5 5  Knee extension 5 5  Ankle dorsiflexion 4* 5  Ankle plantarflexion  5 5  Ankle inversion 5 5  Ankle eversion 5 5  (* = pain; Blank rows = not tested)  Sensation Grossly intact to light touch throughout bilateral LEs as determined by testing dermatomes L2-S2. Proprioception, stereognosis, and hot/cold testing deferred on this date.  Reflexes Deferred  Muscle Length Deferred  Palpation Pain with palpation to anteromedial ankle near the scar from her surgery. Pain with palpation over tibialis anterior distal muscle belly and tendon. Pain to posterior tibialis tendon but no pain at navicular. Painful at base of 5th metatarsal and in space between 3rd and 4th/4th and 5th metatarsals.  Passive Accessory Motion Deferred  VASCULAR Deferred  SPECIAL TESTS Ligamentous Integrity Anterior Drawer (ATF, 10-15 plantarflexion with anterior translation): Negative Talar Tilt (CFL, inversion): Negative Eversion Stress Test (Deltoid, eversion): Negative External Rotation Test (High ankle, dorsiflexion and external rotation): Negative Squeeze Test (High ankle): Negative Impingment Sign (Dorsiflexion and eversion): Negative  Achilles Integrity Thompson Test: Negative  Fracture Screening Metatarsal Axial Loading: Negative Tap/Percussion Test: Not examined Vibration Test: Not examined  Pronation/Supination Navicular Drop: Not examined  Nerve Test Tarsal Tunnel Test (maximal DF, EV, toe ext with tapping over tarsal tunnel): Not examined Test for Morton's Neuroma (compress metatarsals and mobilize): Positive  Other Windlass Mechanism Test: Negative   Beighton Scale Deferred   TODAY'S TREATMENT   SUBJECTIVE: Patient reports to physical therapy with report of minor pain in the foot and ankle with colder weather. Continued forefoot pain with weight bearing activities.  No further questions or concerns.    PAIN: 9/10 Forefoot     Ther-ex: R Ankle Dorsiflexion,  Inversion, Eversion, Blue Theraband 1 x 12;  R Ankle Plantarflexion Blue Theraband 1 x  12; R Ankle Circles against Blue theraband 1 x 12 rotations;   Manual Therapy: STM across muscles bellies and tendons of tibialis anterior, peroneus  muscles, myofascial release techniques utilized.  Manual Stretching of Tibialis Anterior 30s/bout x 3 bouts Manual Stretching of Peroneal muscles 30s/bout x 3 bouts PT performed Palpation assessment, joint mobilizations and compressions of forefoot    Not performed: Airex Pad BLE Calf Raises at Steps 2 x10, no pain reported; Banded Eversion, Green TB 2 x 10;  BLE Feet on Green and Pink balance pad, no UE support, EC 3 x 30s; R Ankle Dorsiflexion, Plantarflexion, Inversion, Eversion Isometric 2 x 10 x 10s; R ankle A/P mobilizations at available end range dorsiflexion, grade I-II, 30s/bout x 2 bouts; R ankle talocrural distraction, grade I-II, 30s/bout x 3 bouts; Extensive STM to medial, anterior, and lateral ankle as well as proximally along anterior tibialis muscle; Wall Supported Toe Raises 2 x 10 x 2s hold;   PATIENT EDUCATION:  Education details: Pt educated throughout session about proper posture and technique with exercises. Educated patient on self STM at Tibialis anterior for pain and stiffness Improved exercise technique, movement at target joints, use of target muscles after min to mod verbal, visual, tactile cues.  Person educated: Patient Education method: Medical illustrator Education comprehension: verbalized and demonstrated understanding   HOME EXERCISE PROGRAM:  Access Code: [email or web address] URL: [email or web address] Date: 02/17/2023 Prepared by: Ria Comment  Exercises - Seated Figure 4 Ankle Inversion with Resistance  - 1 x daily - 7 x weekly - 1-2 sets - 10 reps - 2-3s hold - Seated Ankle Eversion with Resistance (Mirrored)  - 1 x daily - 7 x weekly - 1-2 sets - 10 reps - 2-3s hold - Toe Raise With Back Against Wall  - 1 x daily - 7 x weekly - 1-2 sets - 10 reps - 2-3s hold (On Pause)  -  Standing Bilateral Gastroc Stretch with Step  - 1 x daily - 7 x weekly - 3 reps - 30-45s hold   ASSESSMENT:  CLINICAL IMPRESSION: Patient arrived to physical therapy highly motivated to improve ankle pain. Therapeutic exercises were limited to open chain activities due to high irritability of pain in the forefoot. Although patient's ankle strength has significantly improved, all weight bearing activities worsen forefoot pain. PT performed extensive assessment of forefoot; patient presented with pain during anterior to posterior joint mobilizations of the 4th and 5th metatarsophalangeal (MTP) joints. Additional pain noted with local palpation of 4th and 5th MTP and medial/lateral compression of the forefoot. No pain reported from patient with compression or distraction of 4th and 5th MTP joint. Gait assessment with significant pain during midstance and push off on the Left lower extremity. Currently the patient continues to present with functional limitations due to pain in the forefoot. Pt endorsed follow up with podiatrist next week for further assessment of the foot pain. PT plans to continue strengthening ankle stabilizers. Patient will continue benefit from PT services to address deficits in strength, range of motion, and pain in order to return to full function at home, work, and with leisure activities.  OBJECTIVE IMPAIRMENTS: difficulty walking, decreased strength, and pain.   ACTIVITY LIMITATIONS: standing and stairs  PARTICIPATION LIMITATIONS: shopping, community activity, occupation, and exercise  PERSONAL FACTORS: Past/current experiences and 1-2 comorbidities: anxiety and R ankle arthritis  are also affecting patient's functional outcome.   REHAB POTENTIAL: Good  CLINICAL DECISION MAKING: Evolving/moderate complexity  EVALUATION COMPLEXITY: Moderate   GOALS: Goals reviewed with patient? No  SHORT TERM GOALS: Target date: 03/01/2023  Pt will be independent with HEP to improve  strength and decrease ankle pain to  improve pain-free function at home and work. Baseline:  Goal status: Goal Met    LONG TERM GOALS: Target date: 05/26/2023   Pt will increase FOTO to at least 67 to demonstrate significant improvement in function at home and work related to ankle pain  Baseline: 02/01/23: 47; 03/10/2023: 37  Goal status: ONGOING  2.  Pt will decrease worst ankle pain by at least 3 points on the NPRS in order to demonstrate clinically significant reduction in ankle pain. Baseline: 02/01/23: worst: 8/10; 03/10/2023: Worst 8/10, 04/21/2023: 4/10 Goal status: ONGOING  3.  Pt will increase pain-free strength of R ankle dorsiflexion to 5/5 MMT grade in order to demonstrate improvement in strength and function  Baseline: 02/01/23: 4/5, painful, 03/10/2023: 5/5, Painless  Goal status: GOAL MET    PLAN: PT FREQUENCY: 1-2x/week  PT DURATION: 8 weeks  PLANNED INTERVENTIONS: Therapeutic exercises, Therapeutic activity, Neuromuscular re-education, Balance training, Gait training, Patient/Family education, Self Care, Joint mobilization, Joint manipulation, Vestibular training, Canalith repositioning, Orthotic/Fit training, DME instructions, Dry Needling, Electrical stimulation, Spinal manipulation, Spinal mobilization, Cryotherapy, Moist heat, Taping, Traction, Ultrasound, Ionotophoresis 4mg /ml Dexamethasone, Manual therapy, and Re-evaluation.  PLAN FOR NEXT SESSION: Progress strengthening and manual techniques, TDN to anterior tibialis, modify/review HEP as needed;   Cristal Deer Jaden Batchelder SPT Sharalyn Ink Huprich PT, DPT, GCS  04/29/2023 10:05 AM

## 2023-05-02 ENCOUNTER — Ambulatory Visit: Payer: BC Managed Care – PPO

## 2023-05-02 DIAGNOSIS — K2289 Other specified disease of esophagus: Secondary | ICD-10-CM | POA: Diagnosis present

## 2023-05-03 ENCOUNTER — Encounter: Payer: Self-pay | Admitting: Podiatry

## 2023-05-03 ENCOUNTER — Ambulatory Visit (INDEPENDENT_AMBULATORY_CARE_PROVIDER_SITE_OTHER): Payer: BC Managed Care – PPO

## 2023-05-03 ENCOUNTER — Ambulatory Visit (INDEPENDENT_AMBULATORY_CARE_PROVIDER_SITE_OTHER): Payer: BC Managed Care – PPO | Admitting: Podiatry

## 2023-05-03 DIAGNOSIS — M7751 Other enthesopathy of right foot: Secondary | ICD-10-CM

## 2023-05-03 MED ORDER — TRAMADOL HCL 50 MG PO TABS: 50.0000 mg | ORAL_TABLET | Freq: Four times a day (QID) | ORAL | 0 refills | Status: DC | PRN

## 2023-05-03 MED ORDER — MELOXICAM 15 MG PO TABS: 15.0000 mg | ORAL_TABLET | Freq: Every day | ORAL | 1 refills | Status: DC

## 2023-05-03 NOTE — Progress Notes (Signed)
Chief Complaint  Patient presents with   Foot Pain    Follow up capsulitis right   "Its terrible, feels worse than it did"    Subjective:  42 y.o. female presenting today for follow-up evaluation of right ankle pain and forefoot pain.  Patient states that the injection helped significantly to the right ankle.  She no longer has any pain or tenderness associated.  She continues however to have some continued pain and tenderness associated to the right forefoot..     Brief history: Patient has a history of ORIF right ankle 2008 followed by removal of fibular hardware 2016.  Patient states that in February 2024 she began to experience right ankle pain.  Idiopathic onset.  After few months she began to experience right forefoot pain.  She does admit to compensating with an altered gait.  Denies any history of injury or change in activity.  She was seen at [facility] and also with Ortho care and physical therapy was initiated.  She says that physical therapy is helping but she continues to have pain and tenderness to the right ankle and foot   Past Medical History:  Diagnosis Date   Anxiety    Arthritis    right ankle   Bony exostosis 09/2014   right knee   GERD (gastroesophageal reflux disease)    History of migraine    Hypertension    Neuromuscular disorder (HCC)    trigeminal nerve pain   Pain, eye, left 10/10/2014   with tingling - states possible shingles; to see PCP   Painful orthopaedic hardware (HCC) 09/2014   right ankle    Past Surgical History:  Procedure Laterality Date   BONE EXOSTOSIS EXCISION Right 10/16/2014   Procedure: EXCISION OF BONY EXOSTOSIS FROM RIGHT KNEE;  Surgeon: Tarry Kos, MD;  Location: [facility];  Service: Orthopedics;  Laterality: Right;   CHOLECYSTECTOMY  07/12/2006   DILATATION & CURRETTAGE/HYSTEROSCOPY WITH RESECTOCOPE N/A 09/24/2022   Procedure: DILATATION & CURETTAGE/HYSTEROSCOPY WITH RESECTOCOPE;  Surgeon: Maxie Better, MD;  Location: [facility];  Service: Gynecology;  Laterality: N/A;   ENDOMETRIAL ABLATION N/A 09/24/2022   Procedure: ENDOMETRIAL ABLATION;  Surgeon: Maxie Better, MD;  Location: [facility];  Service: Gynecology;  Laterality: N/A;   HARDWARE REMOVAL Right 10/16/2014   Procedure: HARDWARE REMOVAL RIGHT ANKLE;  Surgeon: Tarry Kos, MD;  Location: [facility];  Service: Orthopedics;  Laterality: Right;   KNEE ARTHROSCOPY Right 09/01/2007   ORIF ANKLE FRACTURE BIMALLEOLAR Right 02/28/2007   ORIF TIBIA FRACTURE Right 02/28/2007   rod placement right   TIBIA HARDWARE REMOVAL Right 09/01/2007    Allergies  Allergen Reactions   Penicillin G Other (See Comments)   Yeast-Derived Drug Products Other (See Comments)    States she avoids yeast products as they cause her yeast infections   Penicillins Other (See Comments)    UNKNOWN - AS AN INFANT    Objective / Physical Exam:  General:  The patient is alert and oriented x3 in no acute distress. Dermatology:  Skin is warm, dry and supple bilateral lower extremities. Negative for open lesions or macerations. Vascular:  Palpable pedal pulses bilaterally. No edema or erythema noted. Capillary refill within normal limits. Neurological:  Epicritic and protective threshold grossly intact bilaterally.  Musculoskeletal Exam:  The pain to the right ankle joint resolved.  There is no pain with palpation or range of motion.  Muscle strength 5/5. There can use to be  pain and tenderness to the lateral aspect of the right forefoot most localized around the fifth MTP.  There is pain with palpation and range of motion of the fifth MTP  XR Ankle Complete Right 01/05/2023 No acute fracture noted.  She does have mild degenerative changes  throughout the ankle in addition to a healed fibula fracture.  No hardware complication of the tibial nail.     XR Foot Complete Right 01/05/2023 Narrative No  acute or structural abnormalities   Radiographic exam RT foot 05/03/2023 Normal osseous mineralization.  Joint spaces preserved.  No acute fractures identified.  Impression: Negative  Assessment: 1.  Capsulitis right ankle; resolved 2.  Fourth and fifth ray pain right suspicious for stress reaction fracture based on clinical exam  Plan of Care:  -Patient was evaluated. X-Rays reviewed.  -Ankle doing well.  Continue physical therapy since the patient states that it is beneficial and she continues to improve with physical therapy. [facility] w/ Ria Comment PT.  Patient has about 2 weeks left of physical therapy - No additional injection administered today.  She only got about 1 week of relief -Continue meloxicam 15 mg daily.  Refill provided - For the right foot pain I did place the patient in a walking cam boot.  WBAT x 4 weeks -OTC power step insoles dispensed.  Wear daily -Prescription for tramadol 50 mg every 6 hours as needed pain - If there is no improvement with the boot over the next 3-4 weeks patient will send a Mychart message and we will go ahead and order an MRI of the right foot to rule out any other pathology or stress fracture   Felecia Shelling, DPM [facility]  Dr. Felecia Shelling, DPM    [address] [place], [place] [address]                Office [phone]  Fax [phone]

## 2023-05-05 ENCOUNTER — Ambulatory Visit: Payer: BC Managed Care – PPO

## 2023-05-05 ENCOUNTER — Inpatient Hospital Stay
Admission: RE | Admit: 2023-05-05 | Discharge: 2023-05-05 | Disposition: A | Discharge status: Home / Self Care | Payer: Self-pay | Source: Ambulatory Visit | Attending: Neurosurgery | Admitting: Neurosurgery

## 2023-05-05 ENCOUNTER — Other Ambulatory Visit: Payer: Self-pay

## 2023-05-05 DIAGNOSIS — Z049 Encounter for examination and observation for unspecified reason: Secondary | ICD-10-CM

## 2023-05-06 NOTE — Progress Notes (Unsigned)
Referring Physician:  Jerl Mina, MD [address] [place],  [place] [address]  Primary Physician:  Jerl Mina, MD  History of Present Illness: 05/09/2023 [name] is here today with a chief complaint of left-sided complaints.  She states that she has had left-sided facial pain and head pain for approximately 20 years.  She does feel like it has been getting worse over the past 4 years.  She has a history of migraines.  Was treated with needling which helped.  She also notices that approximately 4 to 6 years ago had a breakout on her scalp which was painful but self resolving.  She has been treated with Valtrex for this for concern for possible postherpetic neuralgia.  She is also been treated with Lyrica.  For concern for possible trigeminal neuralgia she has been treated with carbamazepine.  She states that she has not noticed any improvement with carbamazepine.  She does notice some improvement with trigger point injections on her face and neck which worked for a few days at a time but do give her significant relief.  She sometimes gets pain into her arm and armpit on the lateral aspect of her chest.  She states that sometimes she can have a trigger from wind or cold but that it is not reproducible and that her symptoms come with no "rhyme or reason".  She is being referred for possible trigeminal neuralgia.  Review of Systems:  A 10 point review of systems is negative, except for the pertinent positives and negatives detailed in the HPI.  Past Medical History: Past Medical History:  Diagnosis Date   Anxiety    Arthritis    right ankle   Bony exostosis 09/2014   right knee   GERD (gastroesophageal reflux disease)    History of migraine    Hypertension    Neuromuscular disorder (HCC)    trigeminal nerve pain   Pain, eye, left 10/10/2014   with tingling - states possible shingles; to see PCP   Painful orthopaedic hardware (HCC) 09/2014   right  ankle    Past Surgical History: Past Surgical History:  Procedure Laterality Date   BONE EXOSTOSIS EXCISION Right 10/16/2014   Procedure: EXCISION OF BONY EXOSTOSIS FROM RIGHT KNEE;  Surgeon: Tarry Kos, MD;  Location: [facility];  Service: Orthopedics;  Laterality: Right;   CHOLECYSTECTOMY  07/12/2006   DILATATION & CURRETTAGE/HYSTEROSCOPY WITH RESECTOCOPE N/A 09/24/2022   Procedure: DILATATION & CURETTAGE/HYSTEROSCOPY WITH RESECTOCOPE;  Surgeon: Maxie Better, MD;  Location: [facility];  Service: Gynecology;  Laterality: N/A;   ENDOMETRIAL ABLATION N/A 09/24/2022   Procedure: ENDOMETRIAL ABLATION;  Surgeon: Maxie Better, MD;  Location: [facility];  Service: Gynecology;  Laterality: N/A;   HARDWARE REMOVAL Right 10/16/2014   Procedure: HARDWARE REMOVAL RIGHT ANKLE;  Surgeon: Tarry Kos, MD;  Location: [facility];  Service: Orthopedics;  Laterality: Right;   KNEE ARTHROSCOPY Right 09/01/2007   ORIF ANKLE FRACTURE BIMALLEOLAR Right 02/28/2007   ORIF TIBIA FRACTURE Right 02/28/2007   rod placement right   TIBIA HARDWARE REMOVAL Right 09/01/2007    Allergies: Allergies as of 05/09/2023 - Review Complete 05/09/2023  Allergen Reaction Noted   Penicillin g Other (See Comments) 06/16/2015   Yeast-derived drug products Other (See Comments) 09/20/2022   Penicillins Other (See Comments) 10/10/2014    Medications:  Current Outpatient Medications:    carbamazepine (CARBATROL) 300 MG 12 hr capsule, Take 300 mg  by mouth 2 (two) times daily., Disp: , Rfl:    cholecalciferol (VITAMIN D3) 25 MCG (1000 UNIT) tablet, Take 1,000 Units by mouth daily., Disp: , Rfl:    cyclobenzaprine (FLEXERIL) 10 MG tablet, Take by mouth., Disp: , Rfl:    diclofenac Sodium (VOLTAREN) 1 % GEL, APPLY 2 TO 4 GRAMS AA BID, Disp: , Rfl:    hydrochlorothiazide (HYDRODIURIL) 12.5 MG tablet, Take by mouth., Disp: , Rfl:    meloxicam (MOBIC) 15 MG  tablet, Take 1 tablet (15 mg total) by mouth daily., Disp: 30 tablet, Rfl: 1   metoprolol succinate (TOPROL-XL) 50 MG 24 hr tablet, Take 1 tablet by mouth daily., Disp: , Rfl:    pantoprazole (PROTONIX) 40 MG tablet, Take 40 mg by mouth daily., Disp: , Rfl:    pregabalin (LYRICA) 150 MG capsule, Take 150 mg in the morning and 300 mg nightly, Disp: , Rfl:    sertraline (ZOLOFT) 100 MG tablet, Take 1 tablet (100 mg total) by mouth daily., Disp: 30 tablet, Rfl: 11   traMADol (ULTRAM) 50 MG tablet, Take 1 tablet (50 mg total) by mouth every 6 (six) hours as needed for up to 7 days., Disp: 28 tablet, Rfl: 0   valACYclovir (VALTREX) 1000 MG tablet, Take 1,000 mg by mouth 2 (two) times daily., Disp: , Rfl:   Current Facility-Administered Medications:    betamethasone acetate-betamethasone sodium phosphate (CELESTONE) injection 3 mg, 3 mg, Intra-articular, Once,   Social History: Social History   Tobacco Use   Smoking status: Some Days    Current packs/day: 0.25    Types: Cigarettes   Smokeless tobacco: Never   Tobacco comments:    Pt. States she occasionally will smoke not everyday  Vaping Use   Vaping status: Never Used  Substance Use Topics   Alcohol use: Yes    Comment: occasionally   Drug use: No    Family Medical History: Family History  Problem Relation Age of Onset   Lupus Mother    Heart disease Father    Rheum arthritis Father    Myasthenia gravis Maternal Grandmother    Diabetes Paternal Grandmother    Breast cancer Paternal Grandmother 69   Colon cancer Paternal Uncle    Colon cancer Maternal Uncle     Physical Examination: Vitals:   05/09/23 1509  BP: 130/82    General: Patient is in no apparent distress. Attention to examination is appropriate.  Neck:   Supple.  Full range of motion.  Respiratory: Patient is breathing without any difficulty.   NEUROLOGICAL:     Awake, alert, oriented to person, place, and time.  Speech is clear and fluent.   Cranial  Nerves: Pupils equal round and reactive to light.  Facial tone is symmetric. Shoulder shrug is symmetric. Tongue protrusion is midline.  There is no pronator drift.  Motor Exam:  No major gross deficits noted in her bilateral upper extremities.  No pronator drift.  Hands are strong.  Intrinsics are strong.  Proximal musculature is strong with symmetric bulk.  Reflexes are 2+ and symmetric at the biceps, triceps, brachioradialis, patella and achilles.   Hoffman's is absent. Clonus is Absent  No major changes in bilateral upper extremity sensation.  Gait is normal.     Medical Decision Making  Imaging: Narrative & Impression  CLINICAL DATA:  42 year old female with chronic migraine, chronic left side facial pain. Recent worsening of symptoms.   EXAM: MRI HEAD WITHOUT AND WITH CONTRAST   TECHNIQUE: Multiplanar, multiecho pulse  sequences of the brain and surrounding structures were obtained without and with intravenous contrast.   CONTRAST:  10mL GADAVIST GADOBUTROL 1 MMOL/ML IV SOLN   COMPARISON:  None Available.   FINDINGS: Brain: Normal cerebral volume, and midline structures are normally formed.   No restricted diffusion to suggest acute infarction. No midline shift, mass effect, evidence of mass lesion, ventriculomegaly, extra-axial collection or acute intracranial hemorrhage. Cervicomedullary junction and pituitary are within normal limits.   Normal for age gray and white matter signal throughout the brain. No chronic cerebral blood products on SWI. No encephalomalacia identified.   No abnormal enhancement identified.  No dural thickening.   And symmetric, normal routine MRI appearance of the cisternal trigeminal nerves (series 15, image 15), the bilateral Meckel's cave, and the bilateral cavernous sinus.   Vascular: Major intracranial vascular flow voids are preserved. The distal left vertebral artery appears dominant, normal variant. Following contrast the major  dural venous sinuses are enhancing and appear to be patent.   Skull and upper cervical spine: Negative visible cervical spine. Visualized bone marrow signal is within normal limits.   Sinuses/Orbits: Symmetric and normal orbits. Paranasal sinuses are well aerated.   Other: Mastoids are clear. Normal stylomastoid foramina. Visible internal auditory structures appear normal. Negative visible scalp and face.   IMPRESSION: Normal MRI appearance of the Brain.  No explanation for pain.     Electronically Signed   By: Odessa Fleming M.D.   On: 04/16/2023 11:34   I have personally reviewed the images and electrodiagnostics and agree with the above interpretation.  Assessment and Plan: [name] is a pleasant 42 y.o. female with 20-year history of left-sided symptoms, worsened over the past 4 to 5 years.  Notably she did have a outbreak on her scalp for which she never had a clear diagnosis.  She states that her pain has gotten worse since that time.  Some concern for possible postherpetic neuralgia so was treated with valacyclovir.  There is also some concern for possible trigeminal neuralgia so she was treated with carbamazepine without any improvement.  She states that the pain is off-and-on and not rhythmic or cyclical in nature.  She does not know any clear always reproducible symptoms or triggers.  She states that she does get relief with trigger point injections around her left cheek, neck, and shoulder.  Review of her imaging does not demonstrate any clear evidence of a vascular loop from the SCA or nearby arteries.  She does not appear to have a compressive lesion causing her left-sided facial symptoms.  We agree with the team about continuing to titrate her neuropathic medications.  At this point does not appear to be symptomatic compression of the trigeminal nerve.  If suspicion becomes high for trigeminal involvement could consider referral to tertiary center for possible percutaneous  treatments.  Thank you for involving me in the care of this patient.    Lovenia Kim MD/MSCR Neurosurgery - Peripheral Nerve Surgery

## 2023-05-09 ENCOUNTER — Encounter: Payer: Self-pay | Admitting: Neurosurgery

## 2023-05-09 ENCOUNTER — Ambulatory Visit (INDEPENDENT_AMBULATORY_CARE_PROVIDER_SITE_OTHER): Payer: BC Managed Care – PPO | Admitting: Neurosurgery

## 2023-05-09 VITALS — BP 130/82 | Ht 68.0 in | Wt 218.8 lb

## 2023-05-09 DIAGNOSIS — R519 Headache, unspecified: Secondary | ICD-10-CM

## 2023-05-09 DIAGNOSIS — M542 Cervicalgia: Secondary | ICD-10-CM

## 2023-05-09 DIAGNOSIS — M25512 Pain in left shoulder: Secondary | ICD-10-CM

## 2023-05-09 DIAGNOSIS — G501 Atypical facial pain: Secondary | ICD-10-CM

## 2023-05-09 DIAGNOSIS — G8929 Other chronic pain: Secondary | ICD-10-CM

## 2023-05-10 ENCOUNTER — Ambulatory Visit: Payer: BC Managed Care – PPO

## 2023-05-10 DIAGNOSIS — M25571 Pain in right ankle and joints of right foot: Secondary | ICD-10-CM

## 2023-05-10 DIAGNOSIS — M6281 Muscle weakness (generalized): Secondary | ICD-10-CM

## 2023-05-10 NOTE — Therapy (Cosign Needed)
OUTPATIENT PHYSICAL THERAPY ANKLE/FOOT TREATMENT DISCHARGE   Patient Name: [name] MRN: [id number] DOB:Mar 11, 1981, 42 y.o., female Today's Date: 05/11/2023  END OF SESSION:  PT End of Session - 05/10/23 1702     Visit Number 19    Number of Visits 33    Date for PT Re-Evaluation 05/26/23    Authorization Type eval: 02/01/23    PT Start Time 1700    PT Stop Time 1745    PT Time Calculation (min) 45 min    Equipment Utilized During Treatment Other (comment)   CAM Boot (Right)   Activity Tolerance Patient tolerated treatment well    Behavior During Therapy [facility] for tasks assessed/performed            Past Medical History:  Diagnosis Date   Anxiety    Arthritis    right ankle   Bony exostosis 09/2014   right knee   GERD (gastroesophageal reflux disease)    History of migraine    Hypertension    Neuromuscular disorder (HCC)    trigeminal nerve pain   Pain, eye, left 10/10/2014   with tingling - states possible shingles; to see PCP   Painful orthopaedic hardware (HCC) 09/2014   right ankle   Past Surgical History:  Procedure Laterality Date   BONE EXOSTOSIS EXCISION Right 10/16/2014   Procedure: EXCISION OF BONY EXOSTOSIS FROM RIGHT KNEE;  Surgeon: Tarry Kos, MD;  Location: [facility];  Service: Orthopedics;  Laterality: Right;   CHOLECYSTECTOMY  07/12/2006   DILATATION & CURRETTAGE/HYSTEROSCOPY WITH RESECTOCOPE N/A 09/24/2022   Procedure: DILATATION & CURETTAGE/HYSTEROSCOPY WITH RESECTOCOPE;  Surgeon: Maxie Better, MD;  Location: [facility];  Service: Gynecology;  Laterality: N/A;   ENDOMETRIAL ABLATION N/A 09/24/2022   Procedure: ENDOMETRIAL ABLATION;  Surgeon: Maxie Better, MD;  Location: [facility];  Service: Gynecology;  Laterality: N/A;   HARDWARE REMOVAL Right 10/16/2014   Procedure: HARDWARE REMOVAL RIGHT ANKLE;  Surgeon: Tarry Kos, MD;  Location: [facility];  Service:  Orthopedics;  Laterality: Right;   KNEE ARTHROSCOPY Right 09/01/2007   ORIF ANKLE FRACTURE BIMALLEOLAR Right 02/28/2007   ORIF TIBIA FRACTURE Right 02/28/2007   rod placement right   TIBIA HARDWARE REMOVAL Right 09/01/2007   There are no problems to display for this patient.  PCP: Jerl Mina MD  REFERRING PROVIDER: Gershon Mussel MD  REFERRING DIAG: [phone] (ICD-10-CM) - Pain in right ankle and joints of right foot   Rationale for Evaluation and Treatment: Rehabilitation  THERAPY DIAG: Pain in right ankle and joints of right foot  Muscle weakness (generalized)  ONSET DATE: Early 2024  FOLLOW-UP APPT SCHEDULED WITH REFERRING PROVIDER: No   From Initial Evaluation SUBJECTIVE:  SUBJECTIVE STATEMENT:  R ankle/foot pain  PERTINENT HISTORY:  Patient is a pleasant 42 year old female who was referred to physical therapy for right ankle pain. She underwent R ankle ORIF after "shattering my ankle" (trimalleolar fracture) in 2008 when she fell down stairs. She is now status post right tibial nail with subsequent hardware removal (lateral plate for fibula fracture removed) back in 2016. She still has the tibial IM nail in place. Pt was doing well until earlier this year when her R foot and ankle started to hurt. She denies any acute injury and states that the only thing she can think might have contributed to her pain was a change in her footwear. She went to [facility] where plain film radiographs showed no acute changes. She was given a home exercise program which did help however pain still remains so she was seen by Dr. Roda Shutters in [place] who referred her for physical therapy. She is now having intermittent discomfort to the R anterior/anteromedial ankle as well as pain in the distal lateral foot (ankle pain  started first followed by foot pain at later date). She has pain most days and symptoms are worse with walking as well as at the end of the day. She notes occasional anterior/anterolateral swelling in the ankle.    R ankle XR (03/01/2007) IMPRESSION:   Spiral type fracture involving the distal fibular shaft above the ankle mortise. There are also two fractures involving the tibia, one is in the mid-tibial shaft region and the other is distally.   XR Ankle Complete Right (01/05/2023) No acute fracture noted.  She does have mild degenerative changes throughout the ankle in addition to a healed fibula fracture.  No hardware complication of the tibial nail.  PAIN:    Pain Intensity: Present: 0/10, Best: 0/10, Worst: 8/10 Pain location: Anterior and anteromedial ankle "deep soreness", R foot pain over the distal 4th and 5th metatarsals (dull ache).  Pain Quality: dull and sore Radiating: No  Swelling: Yes, anterior/anteromedial ankle; Popping, catching, locking: Yes, occasional "pop" which offers pain relief; Numbness/Tingling: No Focal Weakness: Yes, some R ankle weakness reported Aggravating factors: extended standing/walking, exercising, painful walking after first waking in the morning; Relieving factors: pain medicine, rest (offloading), ice, compression stockings at night and sometimes during the day, no benefit with NSAIDs 24-hour pain behavior: Varies throughout the day. Bad especially first thing in the morning History of prior back, hip, knee, or ankle injury, pain, surgery, or therapy: Yes, history of R ankle surgery. R knee pain and swelling since her fall in 2008. She has also been having R low back pain since her injury. Her back pain occasionally radiates into the R hip but never into the knee or ankle;  Dominant hand: right Imaging: Yes, see history Typical footwear: pt has been wearing sneakers since her R foot/ankle pain started Red flags: Positive for weight gain, Negative for  personal history of cancer, chills/fever, night sweats, nausea, vomiting;  PRECAUTIONS: None  WEIGHT BEARING RESTRICTIONS: No  FALLS: Has patient fallen in last 6 months? No  Living Environment Lives with: lives with their daughter Lives in: House/apartment (two level townhome) Stairs: bedroom upstairs and pt has to occasionally Joyia Riehle one step at a time due to the pain;  Prior level of function: Independent  Occupational demands: Gaffer at [employer]  Hobbies: exercising, eating different foods, spending time with friends  Patient Goals: Pt would like to be able to exercise and walk her dog without pain, Latona Krichbaum  for a walk with her dog without pain    OBJECTIVE:   Patient Surveys  LEFS To be completed FOTO 47, predicted improvement to 48  Cognition Patient is oriented to person, place, and time.  Recent memory is intact.  Remote memory is intact.  Attention span and concentration are intact.  Expressive speech is intact.  Patient's fund of knowledge is within normal limits for educational level.    Gross Musculoskeletal Assessment Bulk: Normal Tone: Normal No trophic changes noted to foot/ankle. No ecchymosis, erythema, or edema noted. No gross ankle/foot deformity noted  GAIT: Full gait assessment deferred  Posture: No gross deficits in seated or standing posture which would contribute to her pain;  AROM AROM (Normal range in degrees) AROM   Hip Right Left  Flexion (125)    Extension (15)    Abduction (40)    Adduction     Internal Rotation (45)    External Rotation (45)        Knee    Flexion (135) WNL WNL  Extension (0) WNL WNL      Ankle    Dorsiflexion (20) 12 28  Plantarflexion (50) 50 42  Inversion (35) 18 30  Eversion (15) 8 18  (* = pain; Blank rows = not tested)   LE MMT: MMT (out of 5) Right  Left   Hip flexion 5 5  Hip extension    Hip abduction    Hip adduction    Hip internal rotation    Hip external rotation     Knee flexion 5 5  Knee extension 5 5  Ankle dorsiflexion 4* 5  Ankle plantarflexion 5 5  Ankle inversion 5 5  Ankle eversion 5 5  (* = pain; Blank rows = not tested)  Sensation Grossly intact to light touch throughout bilateral LEs as determined by testing dermatomes L2-S2. Proprioception, stereognosis, and hot/cold testing deferred on this date.  Reflexes Deferred  Muscle Length Deferred  Palpation Pain with palpation to anteromedial ankle near the scar from her surgery. Pain with palpation over tibialis anterior distal muscle belly and tendon. Pain to posterior tibialis tendon but no pain at navicular. Painful at base of 5th metatarsal and in space between 3rd and 4th/4th and 5th metatarsals.  Passive Accessory Motion Deferred  VASCULAR Deferred  SPECIAL TESTS Ligamentous Integrity Anterior Drawer (ATF, 10-15 plantarflexion with anterior translation): Negative Talar Tilt (CFL, inversion): Negative Eversion Stress Test (Deltoid, eversion): Negative External Rotation Test (High ankle, dorsiflexion and external rotation): Negative Squeeze Test (High ankle): Negative Impingment Sign (Dorsiflexion and eversion): Negative  Achilles Integrity Thompson Test: Negative  Fracture Screening Metatarsal Axial Loading: Negative Tap/Percussion Test: Not examined Vibration Test: Not examined  Pronation/Supination Navicular Drop: Not examined  Nerve Test Tarsal Tunnel Test (maximal DF, EV, toe ext with tapping over tarsal tunnel): Not examined Test for Morton's Neuroma (compress metatarsals and mobilize): Positive  Other Windlass Mechanism Test: Negative   Beighton Scale Deferred   TODAY'S TREATMENT  SUBJECTIVE: Patient reports to physical therapy with CAM boot on right foot. Patient with continued pain in the forefoot. No further questions or concerns.    PAIN: 0/10 in the ankle; 10/10 forefoot   Ther-ex: R Ankle Dorsiflexion,  Inversion, Eversion, Blue Theraband  2 x 12;  R Ankle Plantarflexion Blue Theraband 2 x 12; Manual Stretching of Tibialis Anterior 30s/bout x 3 bouts Manual Stretching of Peroneal muscles 30s/bout x 3 bouts Manual Stretching of Gastrocnemius 30s/bout x 3 Bouts   Updated Functional Outcome  Measures: FOTO: 60  Reviewed HEP with return verbalization from patient;    PATIENT EDUCATION:  Education details: Pt educated throughout session about proper posture and technique with exercises. Educated patient on self STM at Tibialis anterior for pain and stiffness Improved exercise technique, movement at target joints, use of target muscles after min to mod verbal, visual, tactile cues.  Person educated: Patient Education method: Medical illustrator Education comprehension: verbalized and demonstrated understanding   HOME EXERCISE PROGRAM:  Access Code: [email or web address] URL: [email or web address] Date: 02/17/2023 Prepared by: Ria Comment  Exercises - Seated Figure 4 Ankle Inversion with Resistance  - 1 x daily - 7 x weekly - 1-2 sets - 10 reps - 2-3s hold - Seated Ankle Eversion with Resistance (Mirrored)  - 1 x daily - 7 x weekly - 1-2 sets - 10 reps - 2-3s hold - Toe Raise With Back Against Wall  - 1 x daily - 7 x weekly - 1-2 sets - 10 reps - 2-3s hold (On Pause)  - Standing Bilateral Gastroc Stretch with Step  - 1 x daily - 7 x weekly - 3 reps - 30-45s hold   ASSESSMENT:  CLINICAL IMPRESSION: Patient arrived to physical therapy highly motivated to improve ankle pain.  Although she reports continued forefoot pain she endorsed 95% improvement in strength and pain in the ankle joint. Today's session with a main focus on reassessing functional outcomes and reviewing HEP. Based on last MMT and updated FOTO the patient has demonstrated improvements in ankle/extrinsic foot strength and functional mobility. Remainder of session PT reviewed HEP with patient and recommended open chain exercises for continued  maintenance in ankle strength. Patient agreeable to discharge in today's appointment. PT recommends patient discharge to home with HEP. No further PT needed.   OBJECTIVE IMPAIRMENTS: difficulty walking, decreased strength, and pain.   ACTIVITY LIMITATIONS: standing and stairs  PARTICIPATION LIMITATIONS: shopping, community activity, occupation, and exercise  PERSONAL FACTORS: Past/current experiences and 1-2 comorbidities: anxiety and R ankle arthritis  are also affecting patient's functional outcome.   REHAB POTENTIAL: Good  CLINICAL DECISION MAKING: Evolving/moderate complexity  EVALUATION COMPLEXITY: Moderate   GOALS: Goals reviewed with patient? No  SHORT TERM GOALS: Target date: 03/01/2023  Pt will be independent with HEP to improve strength and decrease ankle pain to improve pain-free function at home and work. Baseline:  Goal status: Goal Met    LONG TERM GOALS: Target date: 05/26/2023   Pt will increase FOTO to at least 67 to demonstrate significant improvement in function at home and work related to ankle pain  Baseline: 02/01/23: 47; 03/10/2023: 37; 05/10/2023: 60 Goal status: Progressing  2.  Pt will decrease worst ankle pain by at least 3 points on the NPRS in order to demonstrate clinically significant reduction in ankle pain. Baseline: 02/01/23: worst: 8/10; 03/10/2023: Worst 8/10, 04/21/2023: 4/10, 05/10/2023: 2/10  Goal status: GOAL MET   3.  Pt will increase pain-free strength of R ankle dorsiflexion to 5/5 MMT grade in order to demonstrate improvement in strength and function  Baseline: 02/01/23: 4/5, painful, 03/10/2023: 5/5, Painless  Goal status: GOAL MET    PLAN: PT FREQUENCY: 1x/week  PT DURATION: 8 weeks  PLANNED INTERVENTIONS: Therapeutic exercises, Therapeutic activity, Neuromuscular re-education, Balance training, Gait training, Patient/Family education, Self Care, Joint mobilization, Joint manipulation, Vestibular training, Canalith  repositioning, Orthotic/Fit training, DME instructions, Dry Needling, Electrical stimulation, Spinal manipulation, Spinal mobilization, Cryotherapy, Moist heat, Taping, Traction, Ultrasound, Ionotophoresis 4mg /ml Dexamethasone, Manual therapy, and Re-evaluation.  PLAN FOR NEXT  SESSION: Discharge   Markeya Mincy SPT Sharalyn Ink Huprich PT, DPT, GCS  05/11/2023 10:16 AM

## 2023-05-16 ENCOUNTER — Encounter: Payer: Self-pay | Admitting: Podiatry

## 2023-05-17 ENCOUNTER — Other Ambulatory Visit: Payer: Self-pay | Admitting: Podiatry

## 2023-05-17 DIAGNOSIS — M7751 Other enthesopathy of right foot: Secondary | ICD-10-CM

## 2023-05-26 ENCOUNTER — Ambulatory Visit: Payer: BC Managed Care – PPO | Attending: Neurology

## 2023-05-26 DIAGNOSIS — R42 Dizziness and giddiness: Secondary | ICD-10-CM

## 2023-05-26 DIAGNOSIS — M25571 Pain in right ankle and joints of right foot: Secondary | ICD-10-CM

## 2023-05-26 DIAGNOSIS — M6281 Muscle weakness (generalized): Secondary | ICD-10-CM

## 2023-05-26 NOTE — Therapy (Signed)
OUTPATIENT PHYSICAL THERAPY VESTIBULAR EVALUATION  Patient Name: [name] MRN: [id number] DOB:1980/09/17, 42 y.o., female Today's Date: 05/28/2023  END OF SESSION:  PT End of Session - 05/28/23 1104     Visit Number 1    Number of Visits 9    Date for PT Re-Evaluation 07/21/23    Authorization Type eval: 05/26/23    PT Start Time 1702    PT Stop Time 1745    PT Time Calculation (min) 43 min    Activity Tolerance Patient tolerated treatment well    Behavior During Therapy [facility] for tasks assessed/performed            Past Medical History:  Diagnosis Date   Anxiety    Arthritis    right ankle   Bony exostosis 09/2014   right knee   GERD (gastroesophageal reflux disease)    History of migraine    Hypertension    Neuromuscular disorder (HCC)    trigeminal nerve pain   Pain, eye, left 10/10/2014   with tingling - states possible shingles; to see PCP   Painful orthopaedic hardware (HCC) 09/2014   right ankle   Past Surgical History:  Procedure Laterality Date   BONE EXOSTOSIS EXCISION Right 10/16/2014   Procedure: EXCISION OF BONY EXOSTOSIS FROM RIGHT KNEE;  Surgeon: Tarry Kos, MD;  Location: [facility];  Service: Orthopedics;  Laterality: Right;   CHOLECYSTECTOMY  07/12/2006   DILATATION & CURRETTAGE/HYSTEROSCOPY WITH RESECTOCOPE N/A 09/24/2022   Procedure: DILATATION & CURETTAGE/HYSTEROSCOPY WITH RESECTOCOPE;  Surgeon: Maxie Better, MD;  Location: [facility];  Service: Gynecology;  Laterality: N/A;   ENDOMETRIAL ABLATION N/A 09/24/2022   Procedure: ENDOMETRIAL ABLATION;  Surgeon: Maxie Better, MD;  Location: [facility] [place];  Service: Gynecology;  Laterality: N/A;   HARDWARE REMOVAL Right 10/16/2014   Procedure: HARDWARE REMOVAL RIGHT ANKLE;  Surgeon: Tarry Kos, MD;  [facility];  Service: Orthopedics;  Laterality: Right;   KNEE ARTHROSCOPY Right 09/01/2007   ORIF ANKLE FRACTURE  BIMALLEOLAR Right 02/28/2007   ORIF TIBIA FRACTURE Right 02/28/2007   rod placement right   TIBIA HARDWARE REMOVAL Right 09/01/2007   There are no problems to display for this patient.  PCP: Jerl Mina, MD  REFERRING PROVIDER: Lonell Face, MD   REFERRING DIAG: R42 (ICD-10-CM) - Dizziness and giddiness   RATIONALE FOR EVALUATION AND TREATMENT: Rehabilitation  THERAPY DIAG: Dizziness and giddiness  ONSET DATE: Approximately 1 year ago  FOLLOW-UP APPT SCHEDULED WITH REFERRING PROVIDER: Yes    SUBJECTIVE:   Chief Complaint:  Dizziness/whooziness  Pertinent History Dizziness for approximately 1 year. Dizziness occurred suddenly. She was sitting at work and stood up and felt "woozy." Symptoms resolved quickly. She started having similar episodes 2-3x/wk. She talked to her PCP and she was referred to ENT. Saw ENT and audiogram was WNL. Tested for BPPV which was negative at that time. ENT suggested med adjustment so PCP decreased her hydrochlorothiazide dosage and symptoms improved. However symptoms recurred again at the beginning of August. Symptoms occur intermittently. She describes the symptoms as a whoozy sensation. Symptoms last anywhere from seconds to 1 minute. She experiences approximately 2-4 episodes per week. PMH includes chronic L facial pain/trigeminal neuralgia?, neuropathic pain, HTN, anxiety, and chronic R ankle/foot pain.   Brain MRI (03/28/23): Normal MRI appearance of the brain.   Description of dizziness: wooziness Frequency: 2-4 episodes per week Duration: seconds up to 1 minute Symptom nature: positional and motion provoked  Progression of symptoms since onset: Improved initially and then recurred History of similar episodes: Yes, symptoms started 1 year ago, improved, and then recurred. No history of similar episodes prior to 1 year ago.  Provocative Factors: position changes (sit to stand), bending forward occasionally provokes symptoms;  Easing Factors:  waiting for symptoms to pass  Auditory complaints (tinnitus, pain, drainage, hearing loss, aural fullness): Yes, subjective hearing loss in L ear but audiogram WNL per patient. No tinnitus, pain, drainage; Vision changes (diplopia, visual field loss, recent changes, recent eye exam): Yes, occasional blurred/tunnel vision with episodes. Chest pain/palpitations: No History of head injury/concussion: Yes, remote history of falls but nothing recent Stress/anxiety: Yes, improving over the last 6 months but still present Migraines/headaches: Yes, currently not having migraines but history of prior migraines; Nausea/vomiting: No Numbness/tingling: No Focal weakness: No Dysarthria/dysphagia/drop attacks: Yes, occasional slurred speech, last episode 1 month ago. Not associated with numbness/tingling or focal weakness;  Has patient fallen in last 6 months? 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[employer] Hobbies: exercising, eating different foods, spending time with friends  Patient Goals: Pt would like to be able to exercise and walk her dog without pain, go for a walk with her dog without pain   PRECAUTIONS: None  WEIGHT BEARING RESTRICTIONS No  Living Environment Lives with: lives with their daughter Lives in: House/apartment (two level townhome) Stairs: bedroom upstairs   PATIENT GOALS Decrease dizziness   OBJECTIVE EXAMINATION  POSTURE: No gross deficits contributing to symptoms  NEUROLOGICAL SCREEN: (2+ unless otherwise noted.) N=normal  Ab=abnormal  Level Dermatome R L Myotome R L Reflex R L  C3 Anterior Neck N N Sidebend C2-3 N N Jaw CN V    C4 Top of Shoulder N Abn Shoulder Shrug C4 N N Hoffman's UMN    C5 Lateral Upper Arm N Abn Shoulder ABD C4-5 N N Biceps C5-6    C6 Lateral  Arm/ Thumb N N Arm Flex/ Wrist Ext C5-6 N N Brachiorad. C5-6    C7 Middle Finger N N Arm Ext//Wrist Flex C6-7 N N Triceps C7    C8 4th & 5th Finger N N Flex/ Ext Carpi Ulnaris C8 N N Patellar (L3-4)    T1 Medial Arm N N Interossei T1 N N Gastrocnemius    L2 Medial thigh/groin N An Illiopsoas (L2-3) N N     L3 Lower thigh/med.knee N N Quadriceps (L3-4) N N     L4 Medial leg/lat thigh N N Tibialis Ant (L4-5) N N     L5 Lat. leg & dorsal foot N N EHL (L5) N N     S1 post/lat foot/thigh/leg Abn N Gastrocnemius (S1-2) N N     S2 Post./med. thigh & leg N N Hamstrings (L4-S3) N N      CRANIAL NERVES II, III, IV, VI: Pupils equal and reactive to light, visual acuity and visual fields are intact, extraocular muscles are intact  V: Facial sensation is diminished in entire L side of face; VII: Facial strength is intact and symmetric bilaterally  VIII: Hearing is normal as tested by gross conversation IX, X: Palate elevates midline, normal phonation, uvula midline XI: Shoulder shrug strength is intact  XII: Tongue protrudes midline   COORDINATION Finger to Nose: Normal Heel to Shin: Normal Pronator Drift: Negative Rapid Alternating Movements: Normal Finger to  Thumb Opposition: Normal   RANGE OF MOTION Cervical Spine AROM WFL and painless in all planes. No functional focal deficits in AROM noted in BUE/BLE  MANUAL MUSCLE TESTING BUE/BLE strength WNL without focal deficits  TRANSFERS/GAIT Independent for transfers and ambulation without assistive device   PATIENT SURVEYS FOTO: 53, predicted improvement to 60 DHI: to be completed  OCULOMOTOR / VESTIBULAR TESTING  Oculomotor Exam- Room Light  Findings Comments  Ocular Alignment normal   Ocular ROM normal   Spontaneous Nystagmus normal   Gaze-Holding Nystagmus normal   End-Gaze Nystagmus normal   Vergence (normal 2-3") not examined   Smooth Pursuit normal   Cross-Cover Test not examined   Saccades normal   VOR Cancellation normal    Left Head Impulse abnormal Slight corrective saccade  Right Head Impulse normal   Static Acuity not examined   Dynamic Acuity not examined    Oculomotor Exam- Fixation Suppressed: Deferred  BPPV TESTS:  Symptoms Duration Intensity Nystagmus  L Dix-Hallpike None   None  R Dix-Hallpike None   None  L Head Roll None   None  R Head Roll None   None  L Sidelying Test      R Sidelying Test      (blank = not tested)  Clinical Test of Sensory Interaction for Balance (CTSIB): Deferred  Orthostatic Vitals: Supine: BP: 122/77 mmHg, HR: 69 bpm, SpO2: 99% Seated: BP: 117/70 mmHg, HR: 70 bpm, SpO2: 100% Standing (1 minute): BP: 111/75 mmHg, HR: 72 bpm, SpO2: 100% Standing (3 minutes): BP: 131/82 mmHg, HR: 72 bpm, SpO2: 99%    FUNCTIONAL OUTCOME MEASURES  Results Comments  BERG    DGI    FGA    TUG    5TSTS    6 Minute Walk Test    10 Meter Gait Speed    (blank = not tested)   TODAY'S TREATMENT  Deferred   PATIENT EDUCATION:  Education details: Plan of care Person educated: Patient Education method: Explanation Education comprehension: verbalized understanding   HOME EXERCISE PROGRAM:  None currently   ASSESSMENT: CLINICAL IMPRESSION: Patient is a 42 y.o. female who was seen today for physical therapy evaluation and treatment for dizziness. No significant findings during examination today except for slight corrective saccade with HIT testing. Will perform fixation suppression vestibular/oculomotor testing at next visit an repeat orthostatics. Pt encouraged to monitor BP readings and record at home.   OBJECTIVE IMPAIRMENTS: dizziness.   ACTIVITY LIMITATIONS: standing  PARTICIPATION LIMITATIONS: occupation  PERSONAL FACTORS: Time since onset of injury/illness/exacerbation and 3+ comorbidities: anxiety, migraines, and HTN  are also affecting patient's functional outcome.   REHAB POTENTIAL: Fair    CLINICAL DECISION MAKING: Unstable/unpredictable  EVALUATION  COMPLEXITY: Low   GOALS:  SHORT TERM GOALS: Target date: 06/23/2023  Pt will be independent with HEP for dizziness in order to decrease symptoms, improve balance,decrease fall risk, and improve function at home. Baseline: Goal status: INITIAL   LONG TERM GOALS: Target date: 07/21/2023  Pt will increase FOTO to at least 60 to demonstrate significant improvement in function at home related to dizziness.  Baseline: 53 Goal status: INITIAL  2.  Pt will decrease DHI score by at least 18 points in order to demonstrate clinically significant reduction in disability related to dizziness.  Baseline: To be completed Goal status: INITIAL  3.  Pt will report no further episodes of dizziness in order to improve symptom-free function at home and work     Baseline:  Goal status: INITIAL  PLAN: PT FREQUENCY: 1x/week  PT DURATION: 8 weeks  PLANNED INTERVENTIONS: Therapeutic exercises, Therapeutic activity, Neuromuscular re-education, Balance training, Gait training, Patient/Family education, Self Care, Joint mobilization, Joint manipulation, Vestibular training, Canalith repositioning, Orthotic/Fit training, DME instructions, Dry Needling, Electrical stimulation, Spinal manipulation, Spinal mobilization, Cryotherapy, Moist heat, Taping, Traction, Ultrasound, Ionotophoresis 4mg /ml Dexamethasone, Manual therapy, and Re-evaluation.  PLAN FOR NEXT SESSION: DVA, DHI, oculomotor/fixation suppression testing, mCTSIB, sidelying test, repeated orthostatics   Sharalyn Ink Fowler Antos PT, DPT, GCS  Starnisha Batrez, PT 05/28/2023, 11:05 AM

## 2023-05-30 NOTE — Therapy (Signed)
OUTPATIENT PHYSICAL THERAPY VESTIBULAR TREATMENT  Patient Name: [name] MRN: [id number] DOB:06/03/1981, 42 y.o., female Today's Date: 06/03/2023  END OF SESSION:  PT End of Session - 06/02/23 1704     Visit Number 2    Number of Visits 9    Date for PT Re-Evaluation 07/21/23    Authorization Type eval: 05/26/23    PT Start Time 1705    PT Stop Time 1745    PT Time Calculation (min) 40 min    Activity Tolerance Patient tolerated treatment well    Behavior During Therapy [facility] for tasks assessed/performed            Past Medical History:  Diagnosis Date   Anxiety    Arthritis    right ankle   Bony exostosis 09/2014   right knee   GERD (gastroesophageal reflux disease)    History of migraine    Hypertension    Neuromuscular disorder (HCC)    trigeminal nerve pain   Pain, eye, left 10/10/2014   with tingling - states possible shingles; to see PCP   Painful orthopaedic hardware (HCC) 09/2014   right ankle   Past Surgical History:  Procedure Laterality Date   BONE EXOSTOSIS EXCISION Right 10/16/2014   Procedure: EXCISION OF BONY EXOSTOSIS FROM RIGHT KNEE;  Surgeon: Tarry Kos, MD;  Location: [facility];  Service: Orthopedics;  Laterality: Right;   CHOLECYSTECTOMY  07/12/2006   DILATATION & CURRETTAGE/HYSTEROSCOPY WITH RESECTOCOPE N/A 09/24/2022   Procedure: DILATATION & CURETTAGE/HYSTEROSCOPY WITH RESECTOCOPE;  Surgeon: Maxie Better, MD;  Location: [facility];  Service: Gynecology;  Laterality: N/A;   ENDOMETRIAL ABLATION N/A 09/24/2022   Procedure: ENDOMETRIAL ABLATION;  Surgeon: Maxie Better, MD;  Location: [facility];  Service: Gynecology;  Laterality: N/A;   HARDWARE REMOVAL Right 10/16/2014   Procedure: HARDWARE REMOVAL RIGHT ANKLE;  Surgeon: Tarry Kos, MD;  Location: [facility];  Service: Orthopedics;  Laterality: Right;   KNEE ARTHROSCOPY Right 09/01/2007   ORIF ANKLE FRACTURE  BIMALLEOLAR Right 02/28/2007   ORIF TIBIA FRACTURE Right 02/28/2007   rod placement right   TIBIA HARDWARE REMOVAL Right 09/01/2007   There are no problems to display for this patient.  PCP: Jerl Mina, MD  REFERRING PROVIDER: Tarry Kos, MD   REFERRING DIAG: R42 (ICD-10-CM) - Dizziness and giddiness   RATIONALE FOR EVALUATION AND TREATMENT: Rehabilitation  THERAPY DIAG: Dizziness and giddiness  ONSET DATE: Approximately 1 year ago  FOLLOW-UP APPT SCHEDULED WITH REFERRING PROVIDER: Yes   FROM INITIAL EVALUATION SUBJECTIVE:   Chief Complaint:  Dizziness/whooziness  Pertinent History Dizziness for approximately 1 year. Dizziness occurred suddenly. She was sitting at work and stood up and felt "woozy." Symptoms resolved quickly. She started having similar episodes 2-3x/wk. She talked to her PCP and she was referred to ENT. Saw ENT and audiogram was WNL. Tested for BPPV which was negative at that time. ENT suggested med adjustment so PCP decreased her hydrochlorothiazide dosage and symptoms improved. However symptoms recurred again at the beginning of August. Symptoms occur intermittently. She describes the symptoms as a whoozy sensation. Symptoms last anywhere from seconds to 1 minute. She experiences approximately 2-4 episodes per week. PMH includes chronic L facial pain/trigeminal neuralgia?, neuropathic pain, HTN, anxiety, and chronic R ankle/foot pain.   Brain MRI (03/28/23): Normal MRI appearance of the brain.   Description of dizziness: wooziness Frequency: 2-4 episodes per week Duration: seconds up to 1 minute Symptom nature: positional and  motion provoked Progression of symptoms since onset: Improved initially and then recurred History of similar episodes: Yes, symptoms started 1 year ago, improved, and then recurred. No history of similar episodes prior to 1 year ago.  Provocative Factors: position changes (sit to stand), bending forward occasionally provokes  symptoms;  Easing Factors: waiting for symptoms to pass  Auditory complaints (tinnitus, pain, drainage, hearing loss, aural fullness): Yes, subjective hearing loss in L ear but audiogram WNL per patient. No tinnitus, pain, drainage; Vision changes (diplopia, visual field loss, recent changes, recent eye exam): Yes, occasional blurred/tunnel vision with episodes. Chest pain/palpitations: No History of head injury/concussion: Yes, remote history of falls but nothing recent Stress/anxiety: Yes, improving over the last 6 months but still present Migraines/headaches: Yes, currently not having migraines but history of prior migraines; Nausea/vomiting: No Numbness/tingling: No Focal weakness: No Dysarthria/dysphagia/drop attacks: Yes, occasional slurred speech, last episode 1 month ago. Not associated with numbness/tingling or focal weakness;  Has patient fallen in last 6 months? 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: exercising, eating different foods, spending time with friends  Patient Goals: Pt would like to be able to exercise and walk her dog without pain, go for a walk with her dog without pain   PRECAUTIONS: None  WEIGHT BEARING RESTRICTIONS No  Living Environment Lives with: lives with their daughter Lives in: House/apartment (two level townhome) Stairs: bedroom upstairs   PATIENT GOALS Decrease dizziness   OBJECTIVE EXAMINATION  POSTURE: No gross deficits contributing to symptoms  NEUROLOGICAL SCREEN: (2+ unless otherwise noted.) N=normal  Ab=abnormal  Level Dermatome R L Myotome R L Reflex R L  C3 Anterior Neck N N Sidebend C2-3 N N Jaw CN V    C4 Top of Shoulder N Abn Shoulder Shrug C4 N N Hoffman's UMN    C5 Lateral Upper Arm N Abn Shoulder ABD C4-5 N  N Biceps C5-6    C6 Lateral Arm/ Thumb N N Arm Flex/ Wrist Ext C5-6 N N Brachiorad. C5-6    C7 Middle Finger N N Arm Ext//Wrist Flex C6-7 N N Triceps C7    C8 4th & 5th Finger N N Flex/ Ext Carpi Ulnaris C8 N N Patellar (L3-4)    T1 Medial Arm N N Interossei T1 N N Gastrocnemius    L2 Medial thigh/groin N An Illiopsoas (L2-3) N N     L3 Lower thigh/med.knee N N Quadriceps (L3-4) N N     L4 Medial leg/lat thigh N N Tibialis Ant (L4-5) N N     L5 Lat. leg & dorsal foot N N EHL (L5) N N     S1 post/lat foot/thigh/leg Abn N Gastrocnemius (S1-2) N N     S2 Post./med. thigh & leg N N Hamstrings (L4-S3) N N      CRANIAL NERVES II, III, IV, VI: Pupils equal and reactive to light, visual acuity and visual fields are intact, extraocular muscles are intact  V: Facial sensation is diminished in entire L side of face; VII: Facial strength is intact and symmetric bilaterally  VIII: Hearing is normal as tested by gross conversation IX, X: Palate elevates midline, normal phonation, uvula midline XI: Shoulder shrug strength is intact  XII: Tongue protrudes midline   COORDINATION Finger to Nose: Normal Heel to Shin: Normal Pronator Drift: Negative Rapid Alternating Movements: Normal  Finger to Thumb Opposition: Normal   RANGE OF MOTION Cervical Spine AROM WFL and painless in all planes. No functional focal deficits in AROM noted in BUE/BLE  MANUAL MUSCLE TESTING BUE/BLE strength WNL without focal deficits  TRANSFERS/GAIT Independent for transfers and ambulation without assistive device   PATIENT SURVEYS FOTO: 53, predicted improvement to 60 DHI: to be completed  OCULOMOTOR / VESTIBULAR TESTING  Oculomotor Exam- Room Light  Findings Comments  Ocular Alignment normal   Ocular ROM normal   Spontaneous Nystagmus normal   Gaze-Holding Nystagmus normal   End-Gaze Nystagmus normal   Vergence (normal 2-3") not examined   Smooth Pursuit normal   Cross-Cover Test not examined   Saccades  normal   VOR Cancellation normal   Left Head Impulse abnormal Slight corrective saccade  Right Head Impulse normal   Static Acuity not examined   Dynamic Acuity not examined    Oculomotor Exam- Fixation Suppressed: Deferred  BPPV TESTS:  Symptoms Duration Intensity Nystagmus  L Dix-Hallpike None   None  R Dix-Hallpike None   None  L Head Roll None   None  R Head Roll None   None  L Sidelying Test      R Sidelying Test      (blank = not tested)  Clinical Test of Sensory Interaction for Balance (CTSIB): Deferred  Orthostatic Vitals: Supine: BP: 122/77 mmHg, HR: 69 bpm, SpO2: 99% Seated: BP: 117/70 mmHg, HR: 70 bpm, SpO2: 100% Standing (1 minute): BP: 111/75 mmHg, HR: 72 bpm, SpO2: 100% Standing (3 minutes): BP: 131/82 mmHg, HR: 72 bpm, SpO2: 99%    FUNCTIONAL OUTCOME MEASURES  Results Comments  BERG    DGI    FGA    TUG    5TSTS    6 Minute Walk Test    10 Meter Gait Speed    (blank = not tested)   TODAY'S TREATMENT    SUBJECTIVE: Pt states that she has been intermittently dizzy for the last few days. Symptoms lasting minutes at a time with position changes.   PAIN: No pertinent pain reported;   Neuromuscular Re-education   Oculomotor Exam- Fixation Suppressed  Findings Comments  Ocular Alignment normal   Spontaneous Nystagmus abnormal Possible slow L horizontal beating nystagmus but difficult to assess;  Gaze-Holding Nystagmus normal   End-Gaze Nystagmus normal   Head Shaking Nystagmus normal   Pressure-Induced Nystagmus normal   Hyperventilation Induced Nystagmus normal   Skull Vibration Induced Nystagmus normal      BPPV TESTS:  Symptoms Duration Intensity Nystagmus  L Dix-Hallpike Vertigo 30s  L torsional nystagmus  R Dix-Hallpike None >60s  Downbeating  L Head Roll      R Head Roll      L Sidelying Test      R Sidelying Test      (blank = not tested)   Canalith Repositioning Treatment Pt treated with 1 bout of Epley Maneuver for presumed L  posterior canal BPPV. Two minute holds in each position and retesting afterwards. After first maneuver pt continues to present with L torsional nystagmus but less vigorous and mild vertigo (severity decreased). Performed L Semont Maneuver with 2 minute holds in each position. After Semont Maneuver pt continues to present with L torsional nystagmus in L Dix-Hallpike position with concurrent vertigo.     PATIENT EDUCATION:  Education details: Plan of care and BPPV Person educated: Patient Education method: Explanation Education comprehension: verbalized understanding   HOME EXERCISE PROGRAM:  Access Code: [email or web address] URL: [email or web address]  Date: 06/02/2023 Prepared by: Ria Comment  Patient Education - BPPV   ASSESSMENT: CLINICAL IMPRESSION: Performed fixation suppression testing with IR goggles. Pt with possible slow L horizontal beating spontaneous nystagmus but difficult to assess. Consider possible UVH. Proceeded to repeat BPPV testing and this time pt presents with positive L Dix-Hallpike Test for L torsional nystagmus. Pt treated with 1 bout of Epley Maneuver for presumed L posterior canal BPPV. Two minute holds in each position and retesting afterwards. After first maneuver pt continues to present with L torsional nystagmus but less vigorous and mild vertigo (severity decreased). Performed L Semont Maneuver with 2 minute holds in each position. After Semont Maneuver pt continues to present with L torsional nystagmus in L Dix-Hallpike position with concurrent vertigo. Plan to repeat CRT until symptoms resolved.     OBJECTIVE IMPAIRMENTS: dizziness.   ACTIVITY LIMITATIONS: standing  PARTICIPATION LIMITATIONS: occupation  PERSONAL FACTORS: Time since onset of injury/illness/exacerbation and 3+ comorbidities: anxiety, migraines, and HTN  are also affecting patient's functional outcome.   REHAB POTENTIAL: Fair    CLINICAL DECISION MAKING:  Unstable/unpredictable  EVALUATION COMPLEXITY: Low   GOALS:  SHORT TERM GOALS: Target date: 06/23/2023  Pt will be independent with HEP for dizziness in order to decrease symptoms, improve balance,decrease fall risk, and improve function at home. Baseline: Goal status: INITIAL   LONG TERM GOALS: Target date: 07/21/2023  Pt will increase FOTO to at least 60 to demonstrate significant improvement in function at home related to dizziness.  Baseline: 53 Goal status: INITIAL  2.  Pt will decrease DHI score by at least 18 points in order to demonstrate clinically significant reduction in disability related to dizziness.  Baseline: To be completed Goal status: INITIAL  3.  Pt will report no further episodes of dizziness in order to improve symptom-free function at home and work     Baseline:  Goal status: INITIAL   PLAN: PT FREQUENCY: 1x/week  PT DURATION: 8 weeks  PLANNED INTERVENTIONS: Therapeutic exercises, Therapeutic activity, Neuromuscular re-education, Balance training, Gait training, Patient/Family education, Self Care, Joint mobilization, Joint manipulation, Vestibular training, Canalith repositioning, Orthotic/Fit training, DME instructions, Dry Needling, Electrical stimulation, Spinal manipulation, Spinal mobilization, Cryotherapy, Moist heat, Taping, Traction, Ultrasound, Ionotophoresis 4mg /ml Dexamethasone, Manual therapy, and Re-evaluation.  PLAN FOR NEXT SESSION: CRT until BPPV successfully resolved (Semont), DVA, DHI, mCTSIB, repeat orthostatics   Sharalyn Ink Joffrey Kerce PT, DPT, GCS  Chassity Ludke, PT 06/03/2023, 7:59 AM

## 2023-06-01 ENCOUNTER — Ambulatory Visit
Admission: RE | Admit: 2023-06-01 | Discharge: 2023-06-01 | Disposition: A | Discharge status: Home / Self Care | Payer: BC Managed Care – PPO | Source: Ambulatory Visit | Attending: Podiatry | Admitting: Podiatry

## 2023-06-01 DIAGNOSIS — M7751 Other enthesopathy of right foot: Secondary | ICD-10-CM

## 2023-06-02 ENCOUNTER — Ambulatory Visit: Payer: BC Managed Care – PPO

## 2023-06-02 DIAGNOSIS — R42 Dizziness and giddiness: Secondary | ICD-10-CM

## 2023-06-07 ENCOUNTER — Encounter: Payer: Self-pay | Admitting: Podiatry

## 2023-06-15 NOTE — Therapy (Signed)
OUTPATIENT PHYSICAL THERAPY VESTIBULAR TREATMENT  Patient Name: [name] MRN: [id number] DOB:08/02/1980, 42 y.o., female Today's Date: 06/18/2023  END OF SESSION:  PT End of Session - 06/18/23 1939     Visit Number 3    Number of Visits 9    Date for PT Re-Evaluation 07/21/23    Authorization Type eval: 05/26/23    PT Start Time 1710    PT Stop Time 1740    PT Time Calculation (min) 30 min    Equipment Utilized During Treatment Other (comment)   Cam boot on right   Activity Tolerance Patient tolerated treatment well    Behavior During Therapy WFL for tasks assessed/performed            Past Medical History:  Diagnosis Date   Anxiety    Arthritis    right ankle   Bony exostosis 09/2014   right knee   GERD (gastroesophageal reflux disease)    History of migraine    Hypertension    Neuromuscular disorder (HCC)    trigeminal nerve pain   Pain, eye, left 10/10/2014   with tingling - states possible shingles; to see PCP   Painful orthopaedic hardware (HCC) 09/2014   right ankle   Past Surgical History:  Procedure Laterality Date   BONE EXOSTOSIS EXCISION Right 10/16/2014   Procedure: EXCISION OF BONY EXOSTOSIS FROM RIGHT KNEE;  Surgeon: Tarry Kos, MD;  Location: [facility];  Service: Orthopedics;  Laterality: Right;   CHOLECYSTECTOMY  07/12/2006   DILATATION & CURRETTAGE/HYSTEROSCOPY WITH RESECTOCOPE N/A 09/24/2022   Procedure: DILATATION & CURETTAGE/HYSTEROSCOPY WITH RESECTOCOPE;  Surgeon: Maxie Better, MD;  Location: [facility] [place];  Service: Gynecology;  Laterality: N/A;   ENDOMETRIAL ABLATION N/A 09/24/2022   Procedure: ENDOMETRIAL ABLATION;  Surgeon: Maxie Better, MD;  Location: [facility] [place];  Service: Gynecology;  Laterality: N/A;   HARDWARE REMOVAL Right 10/16/2014   Procedure: HARDWARE REMOVAL RIGHT ANKLE;  Surgeon: Tarry Kos, MD;  Location: [facility];  Service: Orthopedics;   Laterality: Right;   KNEE ARTHROSCOPY Right 09/01/2007   ORIF ANKLE FRACTURE BIMALLEOLAR Right 02/28/2007   ORIF TIBIA FRACTURE Right 02/28/2007   rod placement right   TIBIA HARDWARE REMOVAL Right 09/01/2007   There are no problems to display for this patient.  PCP: Jerl Mina, MD  REFERRING PROVIDER: Lonell Face, MD   REFERRING DIAG: R42 (ICD-10-CM) - Dizziness and giddiness   RATIONALE FOR EVALUATION AND TREATMENT: Rehabilitation  THERAPY DIAG: Dizziness and giddiness  ONSET DATE: Approximately 1 year ago  FOLLOW-UP APPT SCHEDULED WITH REFERRING PROVIDER: Yes   FROM INITIAL EVALUATION SUBJECTIVE:   Chief Complaint:  Dizziness/whooziness  Pertinent History Dizziness for approximately 1 year. Dizziness occurred suddenly. She was sitting at work and stood up and felt "woozy." Symptoms resolved quickly. She started having similar episodes 2-3x/wk. She talked to her PCP and she was referred to ENT. Saw ENT and audiogram was WNL. Tested for BPPV which was negative at that time. ENT suggested med adjustment so PCP decreased her hydrochlorothiazide dosage and symptoms improved. However symptoms recurred again at the beginning of August. Symptoms occur intermittently. She describes the symptoms as a whoozy sensation. Symptoms last anywhere from seconds to 1 minute. She experiences approximately 2-4 episodes per week. PMH includes chronic L facial pain/trigeminal neuralgia?, neuropathic pain, HTN, anxiety, and chronic R ankle/foot pain.   Brain MRI (03/28/23): Normal MRI appearance of the brain.   Description of dizziness: wooziness Frequency:  2-4 episodes per week Duration: seconds up to 1 minute Symptom nature: positional and motion provoked Progression of symptoms since onset: Improved initially and then recurred History of similar episodes: Yes, symptoms started 1 year ago, improved, and then recurred. No history of similar episodes prior to 1 year ago.  Provocative  Factors: position changes (sit to stand), bending forward occasionally provokes symptoms;  Easing Factors: waiting for symptoms to pass  Auditory complaints (tinnitus, pain, drainage, hearing loss, aural fullness): Yes, subjective hearing loss in L ear but audiogram WNL per patient. No tinnitus, pain, drainage; Vision changes (diplopia, visual field loss, recent changes, recent eye exam): Yes, occasional blurred/tunnel vision with episodes. Chest pain/palpitations: No History of head injury/concussion: Yes, remote history of falls but nothing recent Stress/anxiety: Yes, improving over the last 6 months but still present Migraines/headaches: Yes, currently not having migraines but history of prior migraines; Nausea/vomiting: No Numbness/tingling: No Focal weakness: No Dysarthria/dysphagia/drop attacks: Yes, occasional slurred speech, last episode 1 month ago. Not associated with numbness/tingling or focal weakness;  Has patient fallen in last 6 months? 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[employer] Hobbies: exercising, eating different foods, spending time with friends  Patient Goals: Pt would like to be able to exercise and walk her dog without pain, go for a walk with her dog without pain   PRECAUTIONS: None  WEIGHT BEARING RESTRICTIONS No  Living Environment Lives with: lives with their daughter Lives in: House/apartment (two level townhome) Stairs: bedroom upstairs   PATIENT GOALS Decrease dizziness   OBJECTIVE EXAMINATION  POSTURE: No gross deficits contributing to symptoms  NEUROLOGICAL SCREEN: (2+ unless otherwise noted.) N=normal  Ab=abnormal  Level Dermatome R L Myotome R L Reflex R L  C3 Anterior Neck N N Sidebend C2-3 N N Jaw CN V    C4 Top of Shoulder N Abn  Shoulder Shrug C4 N N Hoffman's UMN    C5 Lateral Upper Arm N Abn Shoulder ABD C4-5 N N Biceps C5-6    C6 Lateral Arm/ Thumb N N Arm Flex/ Wrist Ext C5-6 N N Brachiorad. C5-6    C7 Middle Finger N N Arm Ext//Wrist Flex C6-7 N N Triceps C7    C8 4th & 5th Finger N N Flex/ Ext Carpi Ulnaris C8 N N Patellar (L3-4)    T1 Medial Arm N N Interossei T1 N N Gastrocnemius    L2 Medial thigh/groin N An Illiopsoas (L2-3) N N     L3 Lower thigh/med.knee N N Quadriceps (L3-4) N N     L4 Medial leg/lat thigh N N Tibialis Ant (L4-5) N N     L5 Lat. leg & dorsal foot N N EHL (L5) N N     S1 post/lat foot/thigh/leg Abn N Gastrocnemius (S1-2) N N     S2 Post./med. thigh & leg N N Hamstrings (L4-S3) N N      CRANIAL NERVES II, III, IV, VI: Pupils equal and reactive to light, visual acuity and visual fields are intact, extraocular muscles are intact  V: Facial sensation is diminished in entire L side of face; VII: Facial strength is intact and symmetric bilaterally  VIII: Hearing is normal as tested by gross conversation IX, X: Palate elevates midline, normal phonation, uvula midline XI: Shoulder shrug strength is intact  XII: Tongue protrudes midline   COORDINATION Finger  to Nose: Normal Heel to Shin: Normal Pronator Drift: Negative Rapid Alternating Movements: Normal Finger to Thumb Opposition: Normal   RANGE OF MOTION Cervical Spine AROM WFL and painless in all planes. No functional focal deficits in AROM noted in BUE/BLE  MANUAL MUSCLE TESTING BUE/BLE strength WNL without focal deficits  TRANSFERS/GAIT Independent for transfers and ambulation without assistive device   PATIENT SURVEYS FOTO: 53, predicted improvement to 60 DHI: to be completed  OCULOMOTOR / VESTIBULAR TESTING  Oculomotor Exam- Room Light  Findings Comments  Ocular Alignment normal   Ocular ROM normal   Spontaneous Nystagmus normal   Gaze-Holding Nystagmus normal   End-Gaze Nystagmus normal   Vergence (normal 2-3")  not examined   Smooth Pursuit normal   Cross-Cover Test not examined   Saccades normal   VOR Cancellation normal   Left Head Impulse abnormal Slight corrective saccade  Right Head Impulse normal   Static Acuity not examined   Dynamic Acuity not examined    Oculomotor Exam- Fixation Suppressed:    Findings Comments  Ocular Alignment normal   Spontaneous Nystagmus abnormal Possible slow L horizontal beating nystagmus but difficult to assess;  Gaze-Holding Nystagmus normal   End-Gaze Nystagmus normal   Head Shaking Nystagmus normal   Pressure-Induced Nystagmus normal   Hyperventilation Induced Nystagmus normal   Skull Vibration Induced Nystagmus normal      BPPV TESTS:  Symptoms Duration Intensity Nystagmus  L Dix-Hallpike None   None  R Dix-Hallpike None   None  L Head Roll None   None  R Head Roll None   None  L Sidelying Test      R Sidelying Test      (blank = not tested)  Clinical Test of Sensory Interaction for Balance (CTSIB): Deferred  Orthostatic Vitals: Supine: BP: 122/77 mmHg, HR: 69 bpm, SpO2: 99% Seated: BP: 117/70 mmHg, HR: 70 bpm, SpO2: 100% Standing (1 minute): BP: 111/75 mmHg, HR: 72 bpm, SpO2: 100% Standing (3 minutes): BP: 131/82 mmHg, HR: 72 bpm, SpO2: 99%    FUNCTIONAL OUTCOME MEASURES  Results Comments  BERG    DGI    FGA    TUG    5TSTS    6 Minute Walk Test    10 Meter Gait Speed    (blank = not tested)   TODAY'S TREATMENT    SUBJECTIVE: Pt reports that she felt whoozy for a few days after her last therapy session. She states that she has been very dizzy today.    PAIN: No pertinent pain reported;   Neuromuscular Re-education  BPPV TESTS:  Symptoms Duration Intensity Nystagmus  L Dix-Hallpike None   None  R Dix-Hallpike None >60s  Downbeating  L Head Roll None   Negative  R Head Roll None   Negative  L Sidelying Test None   Negative  R Sidelying Test None   Negative  (blank = not tested)   Canalith Repositioning  Treatment Pt treated with 1 bout of Epley Maneuver for presumed L posterior canal BPPV given positive testing during prior session. One minute holds in each position .   Neuromuscular Re-education  VOR x 1 horizontal in sitting, target at arms length 3 x 60s (3/10 dizziness); HEP issued;   PATIENT EDUCATION:  Education details: HEP Person educated: Patient Education method: Explanation Education comprehension: verbalized understanding   HOME EXERCISE PROGRAM:  Access Code: [email or web address] URL: [email or web address] Date: 06/16/2023 Prepared by: Ria Comment  Exercises - Seated Gaze Stabilization with Head Rotation  - 4  x daily - 7 x weekly - 3 reps - 60 seconds hold  Patient Education - BPPV   ASSESSMENT: CLINICAL IMPRESSION: Pt treated with 1 bout of Epley Maneuver for presumed L posterior canal BPPV given positive testing during prior session. One minute holds in each position. Afterward performed gaze stabilization exercises and HEP issued. Plan to repeat CRT until symptoms resolved. Pt encouraged to follow-up as scheduled. She will benefit from PT services to address deficits in dizziness in order to return to full function at home and work with less symptoms.   OBJECTIVE IMPAIRMENTS: dizziness.   ACTIVITY LIMITATIONS: standing  PARTICIPATION LIMITATIONS: occupation  PERSONAL FACTORS: Time since onset of injury/illness/exacerbation and 3+ comorbidities: anxiety, migraines, and HTN  are also affecting patient's functional outcome.   REHAB POTENTIAL: Fair    CLINICAL DECISION MAKING: Unstable/unpredictable  EVALUATION COMPLEXITY: Low   GOALS:  SHORT TERM GOALS: Target date: 06/23/2023  Pt will be independent with HEP for dizziness in order to decrease symptoms, improve balance,decrease fall risk, and improve function at home. Baseline: Goal status: INITIAL   LONG TERM GOALS: Target date: 07/21/2023  Pt will increase FOTO to at least 60 to  demonstrate significant improvement in function at home related to dizziness.  Baseline: 53 Goal status: INITIAL  2.  Pt will decrease DHI score by at least 18 points in order to demonstrate clinically significant reduction in disability related to dizziness.  Baseline: To be completed Goal status: INITIAL  3.  Pt will report no further episodes of dizziness in order to improve symptom-free function at home and work     Baseline:  Goal status: INITIAL   PLAN: PT FREQUENCY: 1x/week  PT DURATION: 8 weeks  PLANNED INTERVENTIONS: Therapeutic exercises, Therapeutic activity, Neuromuscular re-education, Balance training, Gait training, Patient/Family education, Self Care, Joint mobilization, Joint manipulation, Vestibular training, Canalith repositioning, Orthotic/Fit training, DME instructions, Dry Needling, Electrical stimulation, Spinal manipulation, Spinal mobilization, Cryotherapy, Moist heat, Taping, Traction, Ultrasound, Ionotophoresis 4mg /ml Dexamethasone, Manual therapy, and Re-evaluation.  PLAN FOR NEXT SESSION: CRT until BPPV successfully resolved ([place]), DVA, DHI, mCTSIB, repeat orthostatics   Sharalyn Ink Manda Holstad PT, DPT, GCS  Cayleigh Paull, PT 06/18/2023, 7:48 PM

## 2023-06-16 ENCOUNTER — Ambulatory Visit: Payer: BC Managed Care – PPO | Attending: Neurology

## 2023-06-16 DIAGNOSIS — R42 Dizziness and giddiness: Secondary | ICD-10-CM | POA: Insufficient documentation

## 2023-06-17 ENCOUNTER — Other Ambulatory Visit: Payer: Self-pay | Admitting: Podiatry

## 2023-06-22 ENCOUNTER — Encounter: Payer: Self-pay | Admitting: Podiatry

## 2023-06-23 ENCOUNTER — Ambulatory Visit: Payer: BC Managed Care – PPO

## 2023-06-23 DIAGNOSIS — R42 Dizziness and giddiness: Secondary | ICD-10-CM | POA: Diagnosis not present

## 2023-06-23 NOTE — Therapy (Signed)
OUTPATIENT PHYSICAL THERAPY VESTIBULAR TREATMENT  Patient Name: [name] MRN: [id number] DOB:03-Jul-1981, 42 y.o., female Today's Date: 06/27/2023  END OF SESSION:  PT End of Session - 06/27/23 1057     Visit Number 4    Number of Visits 9    Date for PT Re-Evaluation 07/21/23    Authorization Type eval: 05/26/23    PT Start Time 1700    PT Stop Time 1745    PT Time Calculation (min) 45 min    Equipment Utilized During Treatment Other (comment)   Cam boot on right   Activity Tolerance Patient tolerated treatment well    Behavior During Therapy WFL for tasks assessed/performed            Past Medical History:  Diagnosis Date   Anxiety    Arthritis    right ankle   Bony exostosis 09/2014   right knee   GERD (gastroesophageal reflux disease)    History of migraine    Hypertension    Neuromuscular disorder (HCC)    trigeminal nerve pain   Pain, eye, left 10/10/2014   with tingling - states possible shingles; to see PCP   Painful orthopaedic hardware (HCC) 09/2014   right ankle   Past Surgical History:  Procedure Laterality Date   BONE EXOSTOSIS EXCISION Right 10/16/2014   Procedure: EXCISION OF BONY EXOSTOSIS FROM RIGHT KNEE;  Surgeon: Tarry Kos, MD;  Location: [facility];  Service: Orthopedics;  Laterality: Right;   CHOLECYSTECTOMY  07/12/2006   DILATATION & CURRETTAGE/HYSTEROSCOPY WITH RESECTOCOPE N/A 09/24/2022   Procedure: DILATATION & CURETTAGE/HYSTEROSCOPY WITH RESECTOCOPE;  Surgeon: Maxie Better, MD;  Location: [facility];  Service: Gynecology;  Laterality: N/A;   ENDOMETRIAL ABLATION N/A 09/24/2022   Procedure: ENDOMETRIAL ABLATION;  Surgeon: Maxie Better, MD;  Location: [facility];  Service: Gynecology;  Laterality: N/A;   HARDWARE REMOVAL Right 10/16/2014   Procedure: HARDWARE REMOVAL RIGHT ANKLE;  Surgeon: Tarry Kos, MD;  Location: [facility];  Service: Orthopedics;   Laterality: Right;   KNEE ARTHROSCOPY Right 09/01/2007   ORIF ANKLE FRACTURE BIMALLEOLAR Right 02/28/2007   ORIF TIBIA FRACTURE Right 02/28/2007   rod placement right   TIBIA HARDWARE REMOVAL Right 09/01/2007   There are no active problems to display for this patient.  PCP: Jerl Mina, MD  REFERRING PROVIDER: Lonell Face, MD   REFERRING DIAG: R42 (ICD-10-CM) - Dizziness and giddiness   RATIONALE FOR EVALUATION AND TREATMENT: Rehabilitation  THERAPY DIAG: Dizziness and giddiness  ONSET DATE: Approximately 1 year ago  FOLLOW-UP APPT SCHEDULED WITH REFERRING PROVIDER: Yes   FROM INITIAL EVALUATION SUBJECTIVE:   Chief Complaint:  Dizziness/whooziness  Pertinent History Dizziness for approximately 1 year. Dizziness occurred suddenly. She was sitting at work and stood up and felt "woozy." Symptoms resolved quickly. She started having similar episodes 2-3x/wk. She talked to her PCP and she was referred to ENT. Saw ENT and audiogram was WNL. Tested for BPPV which was negative at that time. ENT suggested med adjustment so PCP decreased her hydrochlorothiazide dosage and symptoms improved. However symptoms recurred again at the beginning of August. Symptoms occur intermittently. She describes the symptoms as a whoozy sensation. Symptoms last anywhere from seconds to 1 minute. She experiences approximately 2-4 episodes per week. PMH includes chronic L facial pain/trigeminal neuralgia?, neuropathic pain, HTN, anxiety, and chronic R ankle/foot pain.   Brain MRI (03/28/23): Normal MRI appearance of the brain.   Description of dizziness: wooziness  Frequency: 2-4 episodes per week Duration: seconds up to 1 minute Symptom nature: positional and motion provoked Progression of symptoms since onset: Improved initially and then recurred History of similar episodes: Yes, symptoms started 1 year ago, improved, and then recurred. No history of similar episodes prior to 1 year ago.  Provocative  Factors: position changes (sit to stand), bending forward occasionally provokes symptoms;  Easing Factors: waiting for symptoms to pass  Auditory complaints (tinnitus, pain, drainage, hearing loss, aural fullness): Yes, subjective hearing loss in L ear but audiogram WNL per patient. No tinnitus, pain, drainage; Vision changes (diplopia, visual field loss, recent changes, recent eye exam): Yes, occasional blurred/tunnel vision with episodes. Chest pain/palpitations: No History of head injury/concussion: Yes, remote history of falls but nothing recent Stress/anxiety: Yes, improving over the last 6 months but still present Migraines/headaches: Yes, currently not having migraines but history of prior migraines; Nausea/vomiting: No Numbness/tingling: No Focal weakness: No Dysarthria/dysphagia/drop attacks: Yes, occasional slurred speech, last episode 1 month ago. Not associated with numbness/tingling or focal weakness;  Has patient fallen in last 6 months? 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[employer] Hobbies: exercising, eating different foods, spending time with friends  Patient Goals: Pt would like to be able to exercise and walk her dog without pain, go for a walk with her dog without pain   PRECAUTIONS: None  WEIGHT BEARING RESTRICTIONS No  Living Environment Lives with: lives with their daughter Lives in: House/apartment (two level townhome) Stairs: bedroom upstairs   PATIENT GOALS Decrease dizziness   OBJECTIVE EXAMINATION  POSTURE: No gross deficits contributing to symptoms  NEUROLOGICAL SCREEN: (2+ unless otherwise noted.) N=normal  Ab=abnormal  Level Dermatome R L Myotome R L Reflex R L  C3 Anterior Neck N N Sidebend C2-3 N N Jaw CN V    C4 Top of Shoulder N Abn  Shoulder Shrug C4 N N Hoffman's UMN    C5 Lateral Upper Arm N Abn Shoulder ABD C4-5 N N Biceps C5-6    C6 Lateral Arm/ Thumb N N Arm Flex/ Wrist Ext C5-6 N N Brachiorad. C5-6    C7 Middle Finger N N Arm Ext//Wrist Flex C6-7 N N Triceps C7    C8 4th & 5th Finger N N Flex/ Ext Carpi Ulnaris C8 N N Patellar (L3-4)    T1 Medial Arm N N Interossei T1 N N Gastrocnemius    L2 Medial thigh/groin N An Illiopsoas (L2-3) N N     L3 Lower thigh/med.knee N N Quadriceps (L3-4) N N     L4 Medial leg/lat thigh N N Tibialis Ant (L4-5) N N     L5 Lat. leg & dorsal foot N N EHL (L5) N N     S1 post/lat foot/thigh/leg Abn N Gastrocnemius (S1-2) N N     S2 Post./med. thigh & leg N N Hamstrings (L4-S3) N N      CRANIAL NERVES II, III, IV, VI: Pupils equal and reactive to light, visual acuity and visual fields are intact, extraocular muscles are intact  V: Facial sensation is diminished in entire L side of face; VII: Facial strength is intact and symmetric bilaterally  VIII: Hearing is normal as tested by gross conversation IX, X: Palate elevates midline, normal phonation, uvula midline XI: Shoulder shrug strength is intact  XII: Tongue protrudes midline   COORDINATION  Finger to Nose: Normal Heel to Shin: Normal Pronator Drift: Negative Rapid Alternating Movements: Normal Finger to Thumb Opposition: Normal   RANGE OF MOTION Cervical Spine AROM WFL and painless in all planes. No functional focal deficits in AROM noted in BUE/BLE  MANUAL MUSCLE TESTING BUE/BLE strength WNL without focal deficits  TRANSFERS/GAIT Independent for transfers and ambulation without assistive device   PATIENT SURVEYS FOTO: 53, predicted improvement to 60 DHI: to be completed  OCULOMOTOR / VESTIBULAR TESTING  Oculomotor Exam- Room Light  Findings Comments  Ocular Alignment normal   Ocular ROM normal   Spontaneous Nystagmus normal   Gaze-Holding Nystagmus normal   End-Gaze Nystagmus normal   Vergence (normal 2-3")  not examined   Smooth Pursuit normal   Cross-Cover Test not examined   Saccades normal   VOR Cancellation normal   Left Head Impulse abnormal Slight corrective saccade  Right Head Impulse normal   Static Acuity not examined   Dynamic Acuity not examined    Oculomotor Exam- Fixation Suppressed:    Findings Comments  Ocular Alignment normal   Spontaneous Nystagmus abnormal Possible slow L horizontal beating nystagmus but difficult to assess;  Gaze-Holding Nystagmus normal   End-Gaze Nystagmus normal   Head Shaking Nystagmus normal   Pressure-Induced Nystagmus normal   Hyperventilation Induced Nystagmus normal   Skull Vibration Induced Nystagmus normal      BPPV TESTS:  Symptoms Duration Intensity Nystagmus  L Dix-Hallpike None   None  R Dix-Hallpike None   None  L Head Roll None   None  R Head Roll None   None  L Sidelying Test      R Sidelying Test      (blank = not tested)  Clinical Test of Sensory Interaction for Balance (CTSIB): Deferred  Orthostatic Vitals: Supine: BP: 122/77 mmHg, HR: 69 bpm, SpO2: 99% Seated: BP: 117/70 mmHg, HR: 70 bpm, SpO2: 100% Standing (1 minute): BP: 111/75 mmHg, HR: 72 bpm, SpO2: 100% Standing (3 minutes): BP: 131/82 mmHg, HR: 72 bpm, SpO2: 99%    FUNCTIONAL OUTCOME MEASURES  Results Comments  BERG    DGI    FGA    TUG    5TSTS    6 Minute Walk Test    10 Meter Gait Speed    (blank = not tested)   TODAY'S TREATMENT    SUBJECTIVE: Pt reports only a few brief episodes of dizziness since the last therapy session.    PAIN: No pertinent pain reported;   Neuromuscular Re-education  BPPV TESTS:  Symptoms Duration Intensity Nystagmus  L Dix-Hallpike None   None  R Dix-Hallpike Dizziness 15s  A few R torsional beats of nystagmus  L Head Roll      R Head Roll      L Sidelying Test      R Sidelying Test      (blank = not tested)  VOR x 1 horizontal in sitting, target at arms length 3 x 60s;   Canalith Repositioning  Treatment Pt treated with 2 bouts of Epley Maneuver for presumed R posterior canal BPPV. 2 minute holds in each position and retesting between maneuvers. After first maneuver pt continues to present with L torsional nystagmus and dizziness.    PATIENT EDUCATION:  Education details: HEP Person educated: Patient Education method: Explanation Education comprehension: verbalized understanding   HOME EXERCISE PROGRAM:  Access Code: [email or web address] URL: [email or web address] Date: 06/16/2023 Prepared by: Ria Comment  Exercises - Seated Gaze Stabilization with Head Rotation  - 4  x daily - 7 x weekly - 3 reps - 60 seconds hold  Patient Education - BPPV   ASSESSMENT: CLINICAL IMPRESSION: Pt treated with 2 bouts of Epley Maneuver for presumed R posterior canal BPPV. Two minute holds in each position and retesting between maneuvers. After first maneuver pt continues to present with L torsional nystagmus and dizziness. Afterward performed gaze stabilization exercises. Plan to repeat CRT until symptoms resolved. Pt encouraged to follow-up as scheduled. She will benefit from PT services to address deficits in dizziness in order to return to full function at home and work with less symptoms.   OBJECTIVE IMPAIRMENTS: dizziness.   ACTIVITY LIMITATIONS: standing  PARTICIPATION LIMITATIONS: occupation  PERSONAL FACTORS: Time since onset of injury/illness/exacerbation and 3+ comorbidities: anxiety, migraines, and HTN  are also affecting patient's functional outcome.   REHAB POTENTIAL: Fair    CLINICAL DECISION MAKING: Unstable/unpredictable  EVALUATION COMPLEXITY: Low   GOALS:  SHORT TERM GOALS: Target date: 06/23/2023  Pt will be independent with HEP for dizziness in order to decrease symptoms, improve balance,decrease fall risk, and improve function at home. Baseline: Goal status: INITIAL   LONG TERM GOALS: Target date: 07/21/2023  Pt will increase FOTO to at least 60 to  demonstrate significant improvement in function at home related to dizziness.  Baseline: 53 Goal status: INITIAL  2.  Pt will decrease DHI score by at least 18 points in order to demonstrate clinically significant reduction in disability related to dizziness.  Baseline: To be completed Goal status: INITIAL  3.  Pt will report no further episodes of dizziness in order to improve symptom-free function at home and work     Baseline:  Goal status: INITIAL   PLAN: PT FREQUENCY: 1x/week  PT DURATION: 8 weeks  PLANNED INTERVENTIONS: Therapeutic exercises, Therapeutic activity, Neuromuscular re-education, Balance training, Gait training, Patient/Family education, Self Care, Joint mobilization, Joint manipulation, Vestibular training, Canalith repositioning, Orthotic/Fit training, DME instructions, Dry Needling, Electrical stimulation, Spinal manipulation, Spinal mobilization, Cryotherapy, Moist heat, Taping, Traction, Ultrasound, Ionotophoresis 4mg /ml Dexamethasone, Manual therapy, and Re-evaluation.  PLAN FOR NEXT SESSION: CRT until BPPV successfully resolved ([place]), DVA, DHI, mCTSIB, repeat orthostatics   Sharalyn Ink Naheem Mosco PT, DPT, GCS  Wilkins Elpers, PT 06/27/2023, 10:59 AM

## 2023-06-30 ENCOUNTER — Ambulatory Visit: Payer: BC Managed Care – PPO

## 2023-07-01 ENCOUNTER — Telehealth: Payer: Self-pay | Admitting: Podiatry

## 2023-07-01 NOTE — Telephone Encounter (Signed)
Patient is wanting MRI results and plan of action.

## 2023-07-07 ENCOUNTER — Ambulatory Visit: Payer: BC Managed Care – PPO

## 2023-07-07 DIAGNOSIS — R42 Dizziness and giddiness: Secondary | ICD-10-CM | POA: Diagnosis not present

## 2023-07-07 NOTE — Therapy (Signed)
OUTPATIENT PHYSICAL THERAPY VESTIBULAR TREATMENT/DISCHARGE  Patient Name: [name] MRN: [id number] DOB:1980-10-15, 42 y.o., female Today's Date: 07/08/2023  END OF SESSION:  PT End of Session - 07/07/23 1702     Visit Number 5    Number of Visits 9    Date for PT Re-Evaluation 07/21/23    Authorization Type eval: 05/26/23    PT Start Time 1703    PT Stop Time 1745    PT Time Calculation (min) 42 min    Equipment Utilized During Treatment Other (comment)   Cam boot on right   Activity Tolerance Patient tolerated treatment well    Behavior During Therapy WFL for tasks assessed/performed            Past Medical History:  Diagnosis Date   Anxiety    Arthritis    right ankle   Bony exostosis 09/2014   right knee   GERD (gastroesophageal reflux disease)    History of migraine    Hypertension    Neuromuscular disorder (HCC)    trigeminal nerve pain   Pain, eye, left 10/10/2014   with tingling - states possible shingles; to see PCP   Painful orthopaedic hardware (HCC) 09/2014   right ankle   Past Surgical History:  Procedure Laterality Date   BONE EXOSTOSIS EXCISION Right 10/16/2014   Procedure: EXCISION OF BONY EXOSTOSIS FROM RIGHT KNEE;  Surgeon: Tarry Kos, MD;  Location: [facility];  Service: Orthopedics;  Laterality: Right;   CHOLECYSTECTOMY  07/12/2006   DILATATION & CURRETTAGE/HYSTEROSCOPY WITH RESECTOCOPE N/A 09/24/2022   Procedure: DILATATION & CURETTAGE/HYSTEROSCOPY WITH RESECTOCOPE;  Surgeon: Maxie Better, MD;  Location: [facility];  Service: Gynecology;  Laterality: N/A;   ENDOMETRIAL ABLATION N/A 09/24/2022   Procedure: ENDOMETRIAL ABLATION;  Surgeon: Maxie Better, MD;  Location: [facility];  Service: Gynecology;  Laterality: N/A;   HARDWARE REMOVAL Right 10/16/2014   Procedure: HARDWARE REMOVAL RIGHT ANKLE;  Surgeon: Tarry Kos, MD;  Location: [facility];  Service:  Orthopedics;  Laterality: Right;   KNEE ARTHROSCOPY Right 09/01/2007   ORIF ANKLE FRACTURE BIMALLEOLAR Right 02/28/2007   ORIF TIBIA FRACTURE Right 02/28/2007   rod placement right   TIBIA HARDWARE REMOVAL Right 09/01/2007   There are no active problems to display for this patient.  PCP: Jerl Mina, MD  REFERRING PROVIDER: Lonell Face, MD   REFERRING DIAG: R42 (ICD-10-CM) - Dizziness and giddiness   RATIONALE FOR EVALUATION AND TREATMENT: Rehabilitation  THERAPY DIAG: Dizziness and giddiness  ONSET DATE: Approximately 1 year ago  FOLLOW-UP APPT SCHEDULED WITH REFERRING PROVIDER: Yes   FROM INITIAL EVALUATION SUBJECTIVE:   Chief Complaint:  Dizziness/whooziness  Pertinent History Dizziness for approximately 1 year. Dizziness occurred suddenly. She was sitting at work and stood up and felt "woozy." Symptoms resolved quickly. She started having similar episodes 2-3x/wk. She talked to her PCP and she was referred to ENT. Saw ENT and audiogram was WNL. Tested for BPPV which was negative at that time. ENT suggested med adjustment so PCP decreased her hydrochlorothiazide dosage and symptoms improved. However symptoms recurred again at the beginning of August. Symptoms occur intermittently. She describes the symptoms as a whoozy sensation. Symptoms last anywhere from seconds to 1 minute. She experiences approximately 2-4 episodes per week. PMH includes chronic L facial pain/trigeminal neuralgia?, neuropathic pain, HTN, anxiety, and chronic R ankle/foot pain.   Brain MRI (03/28/23): Normal MRI appearance of the brain.   Description of dizziness: wooziness  Frequency: 2-4 episodes per week Duration: seconds up to 1 minute Symptom nature: positional and motion provoked Progression of symptoms since onset: Improved initially and then recurred History of similar episodes: Yes, symptoms started 1 year ago, improved, and then recurred. No history of similar episodes prior to 1 year  ago.  Provocative Factors: position changes (sit to stand), bending forward occasionally provokes symptoms;  Easing Factors: waiting for symptoms to pass  Auditory complaints (tinnitus, pain, drainage, hearing loss, aural fullness): Yes, subjective hearing loss in L ear but audiogram WNL per patient. No tinnitus, pain, drainage; Vision changes (diplopia, visual field loss, recent changes, recent eye exam): Yes, occasional blurred/tunnel vision with episodes. Chest pain/palpitations: No History of head injury/concussion: Yes, remote history of falls but nothing recent Stress/anxiety: Yes, improving over the last 6 months but still present Migraines/headaches: Yes, currently not having migraines but history of prior migraines; Nausea/vomiting: No Numbness/tingling: No Focal weakness: No Dysarthria/dysphagia/drop attacks: Yes, occasional slurred speech, last episode 1 month ago. Not associated with numbness/tingling or focal weakness;  Has patient fallen in last 6 months? 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[employer] Hobbies: exercising, eating different foods, spending time with friends  Patient Goals: Pt would like to be able to exercise and walk her dog without pain, go for a walk with her dog without pain   PRECAUTIONS: None  WEIGHT BEARING RESTRICTIONS No  Living Environment Lives with: lives with their daughter Lives in: House/apartment (two level townhome) Stairs: bedroom upstairs   PATIENT GOALS Decrease dizziness   OBJECTIVE EXAMINATION  POSTURE: No gross deficits contributing to symptoms  NEUROLOGICAL SCREEN: (2+ unless otherwise noted.) N=normal  Ab=abnormal  Level Dermatome R L Myotome R L Reflex R L  C3 Anterior Neck N N Sidebend C2-3 N N Jaw CN V    C4 Top of  Shoulder N Abn Shoulder Shrug C4 N N Hoffman's UMN    C5 Lateral Upper Arm N Abn Shoulder ABD C4-5 N N Biceps C5-6    C6 Lateral Arm/ Thumb N N Arm Flex/ Wrist Ext C5-6 N N Brachiorad. C5-6    C7 Middle Finger N N Arm Ext//Wrist Flex C6-7 N N Triceps C7    C8 4th & 5th Finger N N Flex/ Ext Carpi Ulnaris C8 N N Patellar (L3-4)    T1 Medial Arm N N Interossei T1 N N Gastrocnemius    L2 Medial thigh/groin N An Illiopsoas (L2-3) N N     L3 Lower thigh/med.knee N N Quadriceps (L3-4) N N     L4 Medial leg/lat thigh N N Tibialis Ant (L4-5) N N     L5 Lat. leg & dorsal foot N N EHL (L5) N N     S1 post/lat foot/thigh/leg Abn N Gastrocnemius (S1-2) N N     S2 Post./med. thigh & leg N N Hamstrings (L4-S3) N N      CRANIAL NERVES II, III, IV, VI: Pupils equal and reactive to light, visual acuity and visual fields are intact, extraocular muscles are intact  V: Facial sensation is diminished in entire L side of face; VII: Facial strength is intact and symmetric bilaterally  VIII: Hearing is normal as tested by gross conversation IX, X: Palate elevates midline, normal phonation, uvula midline XI: Shoulder shrug strength is intact  XII: Tongue protrudes midline   COORDINATION  Finger to Nose: Normal Heel to Shin: Normal Pronator Drift: Negative Rapid Alternating Movements: Normal Finger to Thumb Opposition: Normal   RANGE OF MOTION Cervical Spine AROM WFL and painless in all planes. No functional focal deficits in AROM noted in BUE/BLE  MANUAL MUSCLE TESTING BUE/BLE strength WNL without focal deficits  TRANSFERS/GAIT Independent for transfers and ambulation without assistive device   PATIENT SURVEYS FOTO: 53, predicted improvement to 60 DHI: to be completed  OCULOMOTOR / VESTIBULAR TESTING  Oculomotor Exam- Room Light  Findings Comments  Ocular Alignment normal   Ocular ROM normal   Spontaneous Nystagmus normal   Gaze-Holding Nystagmus normal   End-Gaze Nystagmus normal   Vergence  (normal 2-3") not examined   Smooth Pursuit normal   Cross-Cover Test not examined   Saccades normal   VOR Cancellation normal   Left Head Impulse abnormal Slight corrective saccade  Right Head Impulse normal   Static Acuity not examined   Dynamic Acuity not examined    Oculomotor Exam- Fixation Suppressed:    Findings Comments  Ocular Alignment normal   Spontaneous Nystagmus abnormal Possible slow L horizontal beating nystagmus but difficult to assess;  Gaze-Holding Nystagmus normal   End-Gaze Nystagmus normal   Head Shaking Nystagmus normal   Pressure-Induced Nystagmus normal   Hyperventilation Induced Nystagmus normal   Skull Vibration Induced Nystagmus normal      BPPV TESTS:  Symptoms Duration Intensity Nystagmus  L Dix-Hallpike None   None  R Dix-Hallpike None   None  L Head Roll None   None  R Head Roll None   None  L Sidelying Test      R Sidelying Test      (blank = not tested)  Clinical Test of Sensory Interaction for Balance (CTSIB): Deferred  Orthostatic Vitals: Supine: BP: 122/77 mmHg, HR: 69 bpm, SpO2: 99% Seated: BP: 117/70 mmHg, HR: 70 bpm, SpO2: 100% Standing (1 minute): BP: 111/75 mmHg, HR: 72 bpm, SpO2: 100% Standing (3 minutes): BP: 131/82 mmHg, HR: 72 bpm, SpO2: 99%    FUNCTIONAL OUTCOME MEASURES  Results Comments  BERG    DGI    FGA    TUG    5TSTS    6 Minute Walk Test    10 Meter Gait Speed    (blank = not tested)   TODAY'S TREATMENT    SUBJECTIVE: Pt reports only a few brief episodes of dizziness since the last therapy session. At least 50% improvement in symptoms. Episodes are not at as intense. Today will be here last session due to deductible resetting at the start of the next calendar year.    PAIN: No pertinent pain reported;   Neuromuscular Re-education  BPPV TESTS:  Symptoms Duration Intensity Nystagmus  L Dix-Hallpike None   None  R Dix-Hallpike Dizziness 15s  A few R torsional beats of nystagmus  L Head Roll       R Head Roll      L Sidelying Test      R Sidelying Test      (blank = not tested)  VOR x 1 horizontal in sitting, target at arms length 3 x 60s (6/10); Pt completed FOTO and discharge instructions provided;   Canalith Repositioning Treatment Pt treated with 1 bout of Epley Maneuver for presumed R posterior canal BPPV. 2 minute holds in each position. Afterwards performed L Sidelying Test which is negative for vertigo and nystagmus. R Sidelying Test negative for vertigo but positive for some beats of R torsional nystagmus.  Performed two bouts of R Liberatory Maneuver with 2 minute holds in each position.     PATIENT EDUCATION:  Education details: HEP Person educated: Patient Education method: Explanation Education comprehension: verbalized understanding   HOME EXERCISE PROGRAM:  Access Code: [email or web address] URL: [email or web address] Date: 06/16/2023 Prepared by: Ria Comment  Exercises - Seated Gaze Stabilization with Head Rotation  - 4 x daily - 7 x weekly - 3 reps - 60 seconds hold  Patient Education - BPPV   ASSESSMENT: CLINICAL IMPRESSION: Updated outcome measures with patient during visit today. Overall she reports at least 50% improvement in vertigo symptoms since start of therapy and her FOTO improved from 53 to 18. Pt treated with 1 bout of Epley Maneuver for presumed R posterior canal BPPV due to positive Dix-Hallpike on the right. 2 minute holds in each position. Afterwards performed L Sidelying Test which is negative for vertigo and nystagmus. R Sidelying Test negative for vertigo but positive for some beats of R torsional nystagmus. Performed two bouts of R Liberatory Maneuver with 2 minute holds in each position. Afterward performed gaze stabilization exercises and discharge instructions provided to patient. Her deductible resets in the new calendar year and she needs to discharge from therapy at this time. Pt encouraged to continue home CRT and gaze  stabilization and follow-up if symptoms worsen or fail to continue improving.   OBJECTIVE IMPAIRMENTS: dizziness.   ACTIVITY LIMITATIONS: standing  PARTICIPATION LIMITATIONS: occupation  PERSONAL FACTORS: Time since onset of injury/illness/exacerbation and 3+ comorbidities: anxiety, migraines, and HTN  are also affecting patient's functional outcome.   REHAB POTENTIAL: Fair    CLINICAL DECISION MAKING: Unstable/unpredictable  EVALUATION COMPLEXITY: Low   GOALS:  SHORT TERM GOALS: Target date: 06/23/2023  Pt will be independent with HEP for dizziness in order to decrease symptoms, improve balance,decrease fall risk, and improve function at home. Baseline: Goal status: INITIAL   LONG TERM GOALS: Target date: 07/21/2023  Pt will increase FOTO to at least 60 to demonstrate significant improvement in function at home related to dizziness.  Baseline: 53, 07/07/23: 56; Goal status: PARTIALLY MET  2.  Pt will decrease DHI score by at least 18 points in order to demonstrate clinically significant reduction in disability related to dizziness.  Baseline: To be completed Goal status: DISCONTINUED  3.  Pt will report no further episodes of dizziness in order to improve symptom-free function at home and work     Baseline: 50% improvement in symptoms; Goal status: PARTIALLY MET   PLAN: PT FREQUENCY: 1x/week  PT DURATION: 8 weeks  PLANNED INTERVENTIONS: Therapeutic exercises, Therapeutic activity, Neuromuscular re-education, Balance training, Gait training, Patient/Family education, Self Care, Joint mobilization, Joint manipulation, Vestibular training, Canalith repositioning, Orthotic/Fit training, DME instructions, Dry Needling, Electrical stimulation, Spinal manipulation, Spinal mobilization, Cryotherapy, Moist heat, Taping, Traction, Ultrasound, Ionotophoresis 4mg /ml Dexamethasone, Manual therapy, and Re-evaluation.  PLAN FOR NEXT SESSION: Discharge   Lynnea Maizes PT, DPT,  [facility]  Billie Trager, PT 07/08/2023, 1:09 PM

## 2023-07-08 ENCOUNTER — Telehealth: Payer: Self-pay | Admitting: Podiatry

## 2023-07-11 ENCOUNTER — Other Ambulatory Visit: Payer: Self-pay | Admitting: Podiatry

## 2023-07-11 DIAGNOSIS — M7751 Other enthesopathy of right foot: Secondary | ICD-10-CM

## 2023-07-11 NOTE — Progress Notes (Signed)
Spoke with patient today regarding MRI results.  Patient has now had this pain ongoing for about 1 year.  She continues to wear the cam boot however the boot is beginning to affect her lower extremities and causing hip and back pain.  I do believe the next step would be to transition the patient out of the cam boot into good supportive tennis shoes with custom orthotics to help support the medial longitudinal arch of the foot and potentially support and offload the cuboid.  Order placed for custom orthotics.  Message sent to set up an appointment with the patient to be molded for custom orthotics  -Return to clinic for orthotics molding  Felecia Shelling, DPM [facility]  Dr. Felecia Shelling, DPM    [address] [place], [place] [address]                Office [phone]  Fax [phone]

## 2023-07-11 NOTE — Telephone Encounter (Signed)
Patient called, wants to discuss MRI results

## 2023-07-14 ENCOUNTER — Encounter: Payer: Self-pay | Admitting: Podiatry

## 2023-07-26 ENCOUNTER — Ambulatory Visit: Payer: BC Managed Care – PPO | Admitting: Physical Therapy

## 2023-07-26 NOTE — Therapy (Deleted)
 OUTPATIENT PHYSICAL THERAPY THORACOLUMBAR EVALUATION   Patient Name: [name] MRN: [id number] DOB:[birth year], 43 y.o., female Today's Date: 07/26/2023  END OF SESSION:   Past Medical History:  Diagnosis Date   Anxiety    Arthritis    right ankle   Bony exostosis 09/2014   right knee   GERD (gastroesophageal reflux disease)    History of migraine    Hypertension    Neuromuscular disorder (HCC)    trigeminal nerve pain   Pain, eye, left 10/10/2014   with tingling - states possible shingles; to see PCP   Painful orthopaedic hardware (HCC) 09/2014   right ankle   Past Surgical History:  Procedure Laterality Date   BONE EXOSTOSIS EXCISION Right 10/16/2014   Procedure: EXCISION OF BONY EXOSTOSIS FROM RIGHT KNEE;  Surgeon: Kay CHRISTELLA Cummins, MD;  Location: [facility];  Service: Orthopedics;  Laterality: Right;   CHOLECYSTECTOMY  07/12/2006   DILATATION & CURRETTAGE/HYSTEROSCOPY WITH RESECTOCOPE N/A 09/24/2022   Procedure: DILATATION & CURETTAGE/HYSTEROSCOPY WITH RESECTOCOPE;  Surgeon: Rutherford Gain, MD;  Location: [facility] [place];  Service: Gynecology;  Laterality: N/A;   ENDOMETRIAL ABLATION N/A 09/24/2022   Procedure: ENDOMETRIAL ABLATION;  Surgeon: Rutherford Gain, MD;  Location: [facility] [place];  Service: Gynecology;  Laterality: N/A;   HARDWARE REMOVAL Right 10/16/2014   Procedure: HARDWARE REMOVAL RIGHT ANKLE;  Surgeon: Kay CHRISTELLA Cummins, MD;  Location:  SURGERY CENTER;  Service: Orthopedics;  Laterality: Right;   KNEE ARTHROSCOPY Right 09/01/2007   ORIF ANKLE FRACTURE BIMALLEOLAR Right 02/28/2007   ORIF TIBIA FRACTURE Right 02/28/2007   rod placement right   TIBIA HARDWARE REMOVAL Right 09/01/2007   There are no active problems to display for this patient.   PCP: Valora Agent, MD  REFERRING PROVIDER: Dot Boot, PA-C  REFERRING DIAG: ***  RATIONALE FOR EVALUATION AND TREATMENT:  {HABREHAB:27488}  THERAPY DIAG: Other low back pain  Pain in right hip  ONSET DATE: 06/20/23 ***  FOLLOW-UP APPT SCHEDULED WITH REFERRING PROVIDER: {yes/no:20286}    SUBJECTIVE:                                                                                                                                                                                         SUBJECTIVE STATEMENT:  Pt is a 43 year old female known to this clinic (previously seen for post-op rehab for R ankle trimalleolar ORIF and subsequently for vestibular hypofunction) - currently referred for back/R hip pain.   PERTINENT HISTORY:  Pt is a 43 year old female known to this clinic (previously seen for post-op rehab for R ankle trimalleolar ORIF and subsequently for vestibular hypofunction) - currently referred  for back/R hip pain.   Pain began in early December without specific injury or trauma. She has been wearing a short walking boot for R trimalleolar ORIF following fracture. She was given prednisone 40 x 5 days. She did have relief while on prednisone but feels that pain worsened after completion of treatment. She did have leftover tramadol  and cyclobenzaprine that she took that allowed her to sleep but she denies improvement of pain. Bending forward, sitting, or movement while laying down causes worsening of pain. Walking eases her pain. She denies associated bowel incontinence, bladder incontinence, or saddle anesthesia. She has had some radiation of pain to her right leg but this has not been consistent or persistent. She is already taking meloxicam  daily for her foot but does not feel that it alleviates any of her back pain.    PAIN:    Pain Intensity: Present: /10, Best: /10, Worst: /10 Pain location: *** Pain Quality: {PAIN DESCRIPTION:21022940}  Radiating: {yes/no:20286}  Numbness/Tingling: {yes/no:20286} Focal Weakness: {yes/no:20286} Aggravating factors: *** Relieving factors: *** 24-hour pain behavior:  *** How long can you sit: How long can you stand: History of prior back injury, pain, surgery, or therapy: {yes/no:20286} Dominant hand: {RIGHT/LEFT:20294} Imaging: {yes/no:20286}  Red flags: Negative for bowel/bladder changes, saddle paresthesia, personal history of cancer, h/o spinal tumors, h/o compression fx, h/o abdominal aneurysm, abdominal pain, chills/fever, night sweats, nausea, vomiting, unrelenting pain, first onset of insidious LBP <20 y/o  PRECAUTIONS: {Therapy precautions:24002}  WEIGHT BEARING RESTRICTIONS: {Yes ***/No:24003}  FALLS: Has patient fallen in last 6 months? {fallsyesno:27318}  Living Environment Lives with: {OP[facility] lives with:25569::lives with their family} Lives in: {Lives in:25570} Stairs: {opstairs:27293} Has following equipment at home: {Assistive devices:23999}  Prior level of function: {PLOF:24004}  Occupational demands:   Hobbies:   Patient Goals: ***   OBJECTIVE:  Patient Surveys  FOTO ***  Cognition Patient is oriented to person, place, and time.  Recent memory is intact.  Remote memory is intact.  Attention span and concentration are intact.  Expressive speech is intact.  Patient's fund of knowledge is within normal limits for educational level.    Gross Musculoskeletal Assessment Tremor: None Bulk: Normal Tone: Normal No visible step-off along spinal column, no signs of scoliosis  GAIT: Distance walked: *** Assistive device utilized: {Assistive devices:23999} Level of assistance: {Levels of assistance:24026} Comments: ***  Posture: Lumbar lordosis: WNL Iliac crest height: Equal bilaterally Lumbar lateral shift: Negative  AROM AROM (Normal range in degrees) AROM   Lumbar   Flexion (65)   Extension (30)   Right lateral flexion (25)   Left lateral flexion (25)   Right rotation (30)   Left rotation (30)       Hip Right Left  Flexion (125)    Extension (15)    Abduction (40)    Adduction     Internal Rotation  (45)    External Rotation (45)        Knee    Flexion (135)    Extension (0)        Ankle    Dorsiflexion (20)    Plantarflexion (50)    Inversion (35)    Eversion (15)    (* = pain; Blank rows = not tested)  LE MMT: MMT (out of 5) Right  Left   Hip flexion    Hip extension    Hip abduction    Hip adduction    Hip internal rotation    Hip external rotation    Knee flexion  Knee extension    Ankle dorsiflexion    Ankle plantarflexion    Ankle inversion    Ankle eversion    (* = pain; Blank rows = not tested)  Sensation Grossly intact to light touch throughout bilateral LEs as determined by testing dermatomes L2-S2. Proprioception, stereognosis, and hot/cold testing deferred on this date.  Reflexes R/L Knee Jerk (L3/4): 2+/2+  Ankle Jerk (S1/2): 2+/2+   Muscle Length Hamstrings: R: {NEGATIVE/POSITIVE QNM:80001} L: {NEGATIVE/POSITIVE QNM:80001} Ely (quadriceps): R: {NEGATIVE/POSITIVE QNM:80001} L: {NEGATIVE/POSITIVE QNM:80001} Thomas (hip flexors): R: {NEGATIVE/POSITIVE QNM:80001} L: {NEGATIVE/POSITIVE QNM:80001} Ober: R: {NEGATIVE/POSITIVE QNM:80001} L: {NEGATIVE/POSITIVE QNM:80001}  Palpation Location Right Left         Lumbar paraspinals    Quadratus Lumborum    Gluteus Maximus    Gluteus Medius    Deep hip external rotators    PSIS    Fortin's Area (SIJ)    Greater Trochanter    (Blank rows = not tested) Graded on 0-4 scale (0 = no pain, 1 = pain, 2 = pain with wincing/grimacing/flinching, 3 = pain with withdrawal, 4 = unwilling to allow palpation)  Passive Accessory Intervertebral Motion Pt denies reproduction of back pain with CPA L1-L5 and UPA bilaterally L1-L5. Generally, hypomobile throughout  Special Tests Lumbar Radiculopathy and Discogenic: Centralization and Peripheralization (SN 92, -LR 0.12): {NEGATIVE/POSITIVE FOR:19998} Slump (SN 83, -LR 0.32): R: {NEGATIVE/POSITIVE FOR:19998} L: {NEGATIVE/POSITIVE FOR:19998} SLR (SN 92, -LR 0.29): R:  {NEGATIVE/POSITIVE QNM:80001} L:  {NEGATIVE/POSITIVE QNM:80001} Crossed SLR (SP 90): R: {NEGATIVE/POSITIVE QNM:80001} L: {NEGATIVE/POSITIVE QNM:80001}  Facet Joint: Extension-Rotation (SN 100, -LR 0.0): R: {NEGATIVE/POSITIVE QNM:80001} L: {NEGATIVE/POSITIVE QNM:80001}  Lumbar Foraminal Stenosis: Lumbar quadrant (SN 70): R: {NEGATIVE/POSITIVE QNM:80001} L: {NEGATIVE/POSITIVE QNM:80001}  Hip: FABER (SN 81): R: {NEGATIVE/POSITIVE QNM:80001} L: {NEGATIVE/POSITIVE QNM:80001} FADIR (SN 94): R: {NEGATIVE/POSITIVE FOR:19998} L: {NEGATIVE/POSITIVE QNM:80001} Hip scour (SN 50): R: {NEGATIVE/POSITIVE QNM:80001} L: {NEGATIVE/POSITIVE QNM:80001}  SIJ:  Thigh Thrust (SN 88, -LR 0.18) : R: {NEGATIVE/POSITIVE QNM:80001} L: {NEGATIVE/POSITIVE QNM:80001}  Piriformis Syndrome: FAIR Test (SN 88, SP 83): R: {NEGATIVE/POSITIVE QNM:80001} L: {NEGATIVE/POSITIVE QNM:80001}  Functional Tasks Lifting: Deep squat: Sit to stand: Forward Step-Down Test: R:  L:  Lateral Step-Down Test: R:  L:   Beighton scale  LEFT  RIGHT           1. Passive dorsiflexion and hyperextension of the fifth MCP joint beyond 90  0 0   2. Passive apposition of the thumb to the flexor aspect of the forearm  0  0   3. Passive hyperextension of the elbow beyond 10  0  0   4. Passive hyperextension of the knee beyond 10  0  0   5. Active forward flexion of the trunk with the knees fully extended so that the palms of the hands rest flat on the floor   0   TOTAL         0/ 9      TODAY'S TREATMENT: DATE: ***     PATIENT EDUCATION:  Education details: *** Person educated: {Person educated:25204} Education method: {Education Method:25205} Education comprehension: {Education Comprehension:25206}   HOME EXERCISE PROGRAM:     ASSESSMENT:  CLINICAL IMPRESSION: Patient is a 43 y.o. female who was seen today for physical therapy evaluation and treatment for back pain/R hip pain. Current clinical presentation is consistent  with ***.   OBJECTIVE IMPAIRMENTS: {opptimpairments:25111}.   ACTIVITY LIMITATIONS: {activitylimitations:27494}  PARTICIPATION LIMITATIONS: {participationrestrictions:25113}  PERSONAL FACTORS: Past/current experiences and 3+ comorbidities: (anxiety, migraine disorder, and Hx of R ankle trimalleolar  fracture with ORIF)  are also affecting patient's functional outcome.   REHAB POTENTIAL: {rehabpotential:25112}  CLINICAL DECISION MAKING: Evolving/moderate complexity  EVALUATION COMPLEXITY: Moderate   GOALS: Goals reviewed with patient? Yes  SHORT TERM GOALS: Target date: 08/18/2023  Pt will be independent with HEP in order to improve strength and decrease back pain to improve pain-free function at home and work. Baseline: 07/26/23: Baseline HEP initiated, [employer] handout given.  Goal status: INITIAL   LONG TERM GOALS: Target date: 09/09/2023  Pt will increase FOTO to at least *** to demonstrate significant improvement in function at home and work related to back pain  Baseline: 07/26/23: *** Goal status: INITIAL  2.  Pt will decrease worst back pain by at least 2 points on the NPRS in order to demonstrate clinically significant reduction in back pain. Baseline: 07/26/23: *** Goal status: INITIAL  3.  Pt will   Baseline: 07/26/23: *** Goal status: INITIAL  4.  *** Baseline: *** Goal status: INITIAL   PLAN: PT FREQUENCY: 1-2x/week  PT DURATION: 6 weeks  PLANNED INTERVENTIONS: Therapeutic exercises, Therapeutic activity, Neuromuscular re-education, Gait training, Patient/Family education, Self Care, Joint mobilization, Joint manipulation, Dry Needling, Electrical stimulation, Spinal manipulation, Spinal mobilization, Cryotherapy, Moist heat, Taping, Traction, Manual therapy, and Re-evaluation.  PLAN FOR NEXT SESSION: ***    Venetia Endo, PT, DPT [phone]  Venetia ONEIDA Endo, PT 07/26/2023, 11:20 AM

## 2023-07-28 ENCOUNTER — Encounter: Payer: BC Managed Care – PPO | Admitting: Physical Therapy

## 2023-08-02 ENCOUNTER — Ambulatory Visit: Payer: BC Managed Care – PPO | Attending: Family Medicine

## 2023-08-02 DIAGNOSIS — M25571 Pain in right ankle and joints of right foot: Secondary | ICD-10-CM | POA: Insufficient documentation

## 2023-08-02 DIAGNOSIS — M6281 Muscle weakness (generalized): Secondary | ICD-10-CM | POA: Insufficient documentation

## 2023-08-02 DIAGNOSIS — R262 Difficulty in walking, not elsewhere classified: Secondary | ICD-10-CM | POA: Insufficient documentation

## 2023-08-02 DIAGNOSIS — M5459 Other low back pain: Secondary | ICD-10-CM | POA: Insufficient documentation

## 2023-08-02 NOTE — Therapy (Unsigned)
OUTPATIENT PHYSICAL THERAPY LOWER EXTREMITY EVALUATION  Patient Name: [name] MRN: [id number] DOB:06-17-1981, 43 y.o., female Today's Date: 08/03/2023  END OF SESSION:  PT End of Session - 08/02/23 1423     Visit Number 1    Number of Visits 16    Date for PT Re-Evaluation 09/30/23    Authorization Type 08/02/2023    PT Start Time 1649    PT Stop Time 1750    PT Time Calculation (min) 61 min    Activity Tolerance Patient tolerated treatment well    Behavior During Therapy [facility] for tasks assessed/performed             Past Medical History:  Diagnosis Date   Anxiety    Arthritis    right ankle   Bony exostosis 09/2014   right knee   GERD (gastroesophageal reflux disease)    History of migraine    Hypertension    Neuromuscular disorder (HCC)    trigeminal nerve pain   Pain, eye, left 10/10/2014   with tingling - states possible shingles; to see PCP   Painful orthopaedic hardware (HCC) 09/2014   right ankle   Past Surgical History:  Procedure Laterality Date   BONE EXOSTOSIS EXCISION Right 10/16/2014   Procedure: EXCISION OF BONY EXOSTOSIS FROM RIGHT KNEE;  Surgeon: Tarry Kos, MD;  Location: [facility];  Service: Orthopedics;  Laterality: Right;   CHOLECYSTECTOMY  07/12/2006   DILATATION & CURRETTAGE/HYSTEROSCOPY WITH RESECTOCOPE N/A 09/24/2022   Procedure: DILATATION & CURETTAGE/HYSTEROSCOPY WITH RESECTOCOPE;  Surgeon: Maxie Better, MD;  Location: [facility] [place];  Service: Gynecology;  Laterality: N/A;   ENDOMETRIAL ABLATION N/A 09/24/2022   Procedure: ENDOMETRIAL ABLATION;  Surgeon: Maxie Better, MD;  Location: [facility] [place];  Service: Gynecology;  Laterality: N/A;   HARDWARE REMOVAL Right 10/16/2014   Procedure: HARDWARE REMOVAL RIGHT ANKLE;  Surgeon: Tarry Kos, MD;  Location: [facility];  Service: Orthopedics;  Laterality: Right;   KNEE ARTHROSCOPY Right 09/01/2007   ORIF ANKLE  FRACTURE BIMALLEOLAR Right 02/28/2007   ORIF TIBIA FRACTURE Right 02/28/2007   rod placement right   TIBIA HARDWARE REMOVAL Right 09/01/2007   There are no active problems to display for this patient.   PCP: Mickel Baas MD  REFERRING PROVIDER: Cyndia Diver PA  REFERRING DIAG:  M54.50 (ICD-10-CM) - Lumbago  M25.551 (ICD-10-CM) - Right hip pain    THERAPY DIAG:  Low Back pain with radiculopathy to R hip, Muscle weakness  Rationale for Evaluation and Treatment: Rehabilitation  ONSET DATE: 06/02/2023  SUBJECTIVE:   SUBJECTIVE STATEMENT: Pt reported: " One morning in November, I just woke up with back and hip pain. Every so often I cannot get up out of bed because my back feels locked and hurts. My pain goes into R hip. My R hip hurts 80 to 90 % of the time. 6/10 and at times it goes as high as 10/10. Sometimes my pain travels up towards my lower part of the neck. The pain is shooting and constant. It limits me form doing all my activities. Walking, cleaning, sitting, bending, squatting and lifting are all very painful and am limited."   PERTINENT HISTORY:  Pt is concern of pain to bilateral lower back and right hip pain. Her lower back pain began around six weeks ago without trauma or injury. She was seen by Telehealth with [facility] on 06/27/23 for back pain that had been present for one week. No trauma or injury  prior to onset. She has been wearing a short CAM walking boot for foot fracture. Recent MRI shows Stress Reaction and no fracture. Pt advised to continue ot wear the boot. She was given prednisone 40 x 5 days. She did have relief while on prednisone but feels that pain worsened after completion of treatment. She has also had newer right hip pain for the past week or so. Bending forward, sitting, or movement while laying down causes worsening of pain. Walking eases her pain. She denies associated bowel incontinence, bladder incontinence, or saddle anesthesia. She has had some  radiation of pain to her right leg but this has not been consistent or persistent. She is already taking meloxicam daily for her foot but does not feel that it alleviates any of her back pain.   PAIN:  Are you having pain? Yes: NPRS scale: 6 to 10/10 pain Pain location: In lower back into gluteal area into hip. Pain description: Shooting and constant Aggravating factors: activities of daily living Relieving factors: Heat, lying on back with heating pad  PRECAUTIONS: None  RED FLAGS: None   WEIGHT BEARING RESTRICTIONS: No Needs to wear [place].   FALLS:  Has patient fallen in last 6 months? No  LIVING ENVIRONMENT: Lives with: lives with their family and lives with their daughter Lives in: House/apartment Stairs: Yes: Internal: flight steps; on left going up Has following equipment at home: None  OCCUPATION: Herbalist in a Transport planner office.   PLOF: Independent  PATIENT GOALS: " I want to be able to daily activities without Pain."  NEXT MD VISIT: 08/11/2023  OBJECTIVE:  Note: Objective measures were completed at Evaluation unless otherwise noted.  DIAGNOSTIC FINDINGS: MRI completed.   PATIENT SURVEYS:  Modified Oswestry 30/50 severe disability  LEFS 12/80 FOTO 36/60   COGNITION: Overall cognitive status: Within functional limits for tasks assessed     SENSATION: WFL   MUSCLE LENGTH: WFL   POSTURE: rounded shoulders and decreased lumbar lordosis  PALPATION: Pain  to L4/L5 and B SI  LOWER EXTREMITY ROM:  WFL RLE pain with SLR.     LOWER EXTREMITY MMT:  L hip 3/5 in all planes, R hip 3-/5 2/2 to pain., B knees 4/5, L ankle 4/5 R ankle deferred 2/2 to injury.    FUNCTIONAL TESTS:  30 seconds chair stand test: 10 reps completed 10 meter walk test: in 9 secs  FABER test Positive, SI compression positive, SLR positive to R, and R Slump test positive GAIT: Distance walked: 157ft Assistive device utilized: None Level of assistance: Complete  Independence Comments: Pt walks in CAMBOOT with apin during RLE stance phase. Heel strike absent in RLE.                                                                                                                                 TREATMENT DATE: 08/03/2023  TA:  Low complexity eval completed. PT discussed findings in depth,  goals to complete pt specific POC. Pt advised  to purchase a pair of shoes to match the hight of the CAMBOOT to prevent the LLD caused by the boot.     PATIENT EDUCATION:  Education details: As above Person educated: Patient Education method: Explanation Education comprehension: verbalized understanding  HOME EXERCISE PROGRAM: To be furnished in future  session.   ASSESSMENT:  CLINICAL IMPRESSION: Patient is a 43 y.o. female  who was seen today for physical therapy evaluation and treatment for L/S and R hip pain. Pt is familiar to this OP clinic. Pt has been wearing a Walking boot for over 4 month which has caused LLD due to foot wear height. Pt's back pain is limiting pt with all functional mobility. Pt goals to return to becoming active with all activities without pain. Pt R hip Jt PROM and Palpation negative. However, Slump Test positive to RLE, quadrant test positive to R side causing pain in R hip and gluteal region. Pt SLR positive at 50 degrees.  PT also found weakness in B hip, gait deviations limiting pt with all functional mobility evident by LEFS, 30 sec STS, ODI score. Pt has new onset of back pain due to  severe gait deviation due to Walking in a CAM boot  on leg. Pt will benefit from PT Interventions to address impairments and help pt achieve her goals.    OBJECTIVE IMPAIRMENTS: Abnormal gait, decreased activity tolerance, difficulty walking, decreased strength, and pain.   ACTIVITY LIMITATIONS: carrying, lifting, bending, sitting, standing, squatting, and transfers  PARTICIPATION LIMITATIONS: cleaning, laundry, driving, community activity, and  occupation  PERSONAL FACTORS: Age and Time since onset of injury/illness/exacerbation are also affecting patient's functional outcome.   REHAB POTENTIAL: Good  CLINICAL DECISION MAKING: Stable/uncomplicated  EVALUATION COMPLEXITY: Low   GOALS: Goals reviewed with patient? Yes  SHORT TERM GOALS: Target date: 09/05/2023 Pt will become Independent with HEP to promote functional mobility Baseline: HEP to be furnished next session Goal status: INITIAL    LONG TERM GOALS: Target date: 09/30/2023   Pt will reduce pain with all function to 2/10 to improve QOL Baseline:  Goal status: INITIAL  2.  Pt will complete >15 STS reps in 30 secs to demonstrate improve BLE strength Baseline: 10 Goal status: INITIAL  3.  Pt will imporve [facility] score to >50/80 to demonstrate progress with LE functions and QOL Baseline: 12/80 Goal status: INITIAL  4.  Pt will become SLR/Slump Test negative with pain Baseline: Positve Goal status: INITIAL  5.  Pt will improve ODI score to <15/50 to demonstrate improve pain and QOL.  Baseline: 30/50 Goal status: INITIAL   PLAN:  PT FREQUENCY: 1-2x/week  PT DURATION: 8 weeks  PLANNED INTERVENTIONS: 97110-Therapeutic exercises, 97530- Therapeutic activity, [id number]- Neuromuscular re-education, 97535- Self Care, 56433- Manual therapy, and 97014- Electrical stimulation (unattended)  PLAN FOR NEXT SESSION: Continue as per POC.   Janet Berlin PT DPT 2:28 PM,08/03/23

## 2023-08-04 ENCOUNTER — Ambulatory Visit: Payer: BC Managed Care – PPO

## 2023-08-04 ENCOUNTER — Encounter: Payer: BC Managed Care – PPO | Admitting: Physical Therapy

## 2023-08-04 NOTE — Progress Notes (Signed)
Took scans for orthotics patient wants to check ins and will let us know

## 2023-08-08 NOTE — Therapy (Signed)
OUTPATIENT PHYSICAL THERAPY THORACOLUMBAR TREATMENT  Patient Name: [name] MRN: [id number] DOB:23-Apr-1981, 43 y.o., female Today's Date: 08/09/2023  END OF SESSION:  PT End of Session - 08/09/23 1644     Visit Number 2    Number of Visits 16    Date for PT Re-Evaluation 09/30/23    Authorization Type 08/02/2023    PT Start Time 1645    PT Stop Time 1727    PT Time Calculation (min) 42 min    Activity Tolerance Patient tolerated treatment well    Behavior During Therapy [facility] for tasks assessed/performed              Past Medical History:  Diagnosis Date   Anxiety    Arthritis    right ankle   Bony exostosis 09/2014   right knee   GERD (gastroesophageal reflux disease)    History of migraine    Hypertension    Neuromuscular disorder (HCC)    trigeminal nerve pain   Pain, eye, left 10/10/2014   with tingling - states possible shingles; to see PCP   Painful orthopaedic hardware (HCC) 09/2014   right ankle   Past Surgical History:  Procedure Laterality Date   BONE EXOSTOSIS EXCISION Right 10/16/2014   Procedure: EXCISION OF BONY EXOSTOSIS FROM RIGHT KNEE;  Surgeon: Tarry Kos, MD;  Location: [facility];  Service: Orthopedics;  Laterality: Right;   CHOLECYSTECTOMY  07/12/2006   DILATATION & CURRETTAGE/HYSTEROSCOPY WITH RESECTOCOPE N/A 09/24/2022   Procedure: DILATATION & CURETTAGE/HYSTEROSCOPY WITH RESECTOCOPE;  Surgeon: Maxie Better, MD;  Location: [facility];  Service: Gynecology;  Laterality: N/A;   ENDOMETRIAL ABLATION N/A 09/24/2022   Procedure: ENDOMETRIAL ABLATION;  Surgeon: Maxie Better, MD;  Location: [facility] ;  Service: Gynecology;  Laterality: N/A;   HARDWARE REMOVAL Right 10/16/2014   Procedure: HARDWARE REMOVAL RIGHT ANKLE;  Surgeon: Tarry Kos, MD;  Location: [facility];  Service: Orthopedics;  Laterality: Right;   KNEE ARTHROSCOPY Right 09/01/2007   ORIF ANKLE  FRACTURE BIMALLEOLAR Right 02/28/2007   ORIF TIBIA FRACTURE Right 02/28/2007   rod placement right   TIBIA HARDWARE REMOVAL Right 09/01/2007   There are no active problems to display for this patient.   PCP: Mickel Baas MD  REFERRING PROVIDER: Cyndia Diver PA  REFERRING DIAG:  M54.50 (ICD-10-CM) - Lumbago  M25.551 (ICD-10-CM) - Right hip pain    THERAPY DIAG:  Low Back pain with radiculopathy to R hip, Muscle weakness  Rationale for Evaluation and Treatment: Rehabilitation  ONSET DATE: 06/02/2023  SUBJECTIVE:   SUBJECTIVE STATEMENT: Pt reported: " One morning in November, I just woke up with back and hip pain. Every so often I cannot get up out of bed because my back feels locked and hurts. My pain goes into R hip. My R hip hurts 80 to 90 % of the time. 6/10 and at times it goes as high as 10/10. Sometimes my pain travels up towards my lower part of the neck. The pain is shooting and constant. It limits me form doing all my activities. Walking, cleaning, sitting, bending, squatting and lifting are all very painful and am limited."   PERTINENT HISTORY:  Pt is concern of pain to bilateral lower back and right hip pain. Her lower back pain began around six weeks ago without trauma or injury. She was seen by Telehealth with [facility] on 06/27/23 for back pain that had been present for one week. No trauma or injury  prior to onset. She has been wearing a short CAM walking boot for foot fracture. Recent MRI shows Stress Reaction and no fracture. Pt advised to continue ot wear the boot. She was given prednisone 40 x 5 days. She did have relief while on prednisone but feels that pain worsened after completion of treatment. She has also had newer right hip pain for the past week or so. Bending forward, sitting, or movement while laying down causes worsening of pain. Walking eases her pain. She denies associated bowel incontinence, bladder incontinence, or saddle anesthesia. She has had some  radiation of pain to her right leg but this has not been consistent or persistent. She is already taking meloxicam daily for her foot but does not feel that it alleviates any of her back pain.   PAIN:  Are you having pain? Yes: NPRS scale: 6 to 10/10 pain Pain location: In lower back into gluteal area into hip. Pain description: Shooting and constant Aggravating factors: activities of daily living Relieving factors: Heat, lying on back with heating pad  PRECAUTIONS: None  RED FLAGS: None   WEIGHT BEARING RESTRICTIONS: No Needs to wear Camboot.   FALLS:  Has patient fallen in last 6 months? No  LIVING ENVIRONMENT: Lives with: lives with their family and lives with their daughter Lives in: House/apartment Stairs: Yes: Internal: flight steps; on left going up Has following equipment at home: None  OCCUPATION: Herbalist in a Transport planner office.   PLOF: Independent  PATIENT GOALS: " I want to be able to daily activities without Pain."  NEXT MD VISIT: 08/11/2023  OBJECTIVE:  Note: Objective measures were completed at Evaluation unless otherwise noted.  DIAGNOSTIC FINDINGS: MRI completed.   PATIENT SURVEYS:  Modified Oswestry 30/50 severe disability  LEFS 12/80 FOTO 36/60   COGNITION: Overall cognitive status: Within functional limits for tasks assessed     SENSATION: WFL   MUSCLE LENGTH: WFL   POSTURE: rounded shoulders and decreased lumbar lordosis  PALPATION: Pain  to L4/L5 and B SI  LOWER EXTREMITY ROM:  WFL RLE pain with SLR.     LOWER EXTREMITY MMT:  L hip 3/5 in all planes, R hip 3-/5 2/2 to pain., B knees 4/5, L ankle 4/5 R ankle deferred 2/2 to injury.    FUNCTIONAL TESTS:  30 seconds chair stand test: 10 reps completed 10 meter walk test: in 9 secs  FABER test Positive, SI compression positive, SLR positive to R, and R Slump test positive GAIT: Distance walked: 142ft Assistive device utilized: None Level of assistance: Complete  Independence Comments: Pt walks in CAMBOOT with apin during RLE stance phase. Heel strike absent in RLE.                                                                                                                                 TREATMENT DATE: 08/09/23 ***  Subjective: Patient reports she is good and hurting today. [employer]  last Thursday for scan for custom orthotics.    Manual:  General stretching in supine (lower trunk rotation, knee to chest, piriformis) x 10 minutes Prone STM to lumbar paraspinals and B piriformis x 5 minutes - notable tightness to R piriformis   TE:  Seated forward trunk flexion x 3 Seated piriformis stretch 2 x 30 seconds each   PATIENT EDUCATION: ***   PATIENT EDUCATION:  Education details: As above Person educated: Patient Education method: Explanation Education comprehension: verbalized understanding  HOME EXERCISE PROGRAM: Access Code: [email or web address] URL: [email or web address] Date: 08/09/2023 Prepared by: Maylon Peppers  Exercises - Seated Piriformis Stretch  - 2-3 x daily - 5-7 x weekly - 3-5 reps - 30-60 sec hold - Seated Flexion Stretch  - 2-3 x daily - 5-7 x weekly - 10 reps - 5 sec hold - Supine Lower Trunk Rotation  - 2 x daily - 5-7 x weekly - 1-2 sets - 10 reps - 3-5 sec hold - Supine Single Knee to Chest Stretch  - 2 x daily - 5-7 x weekly - 1-2 sets - 10 reps - 3-5 sec hold - Supine Double Knee to Chest  - 2-3 x daily - 5-7 x weekly - 1-2 sets - 10 reps - 3-5 sec hold  ASSESSMENT:  CLINICAL IMPRESSION: ***  Patient is a 43 y.o. female  who was seen today for physical therapy evaluation and treatment for L/S and R hip pain. Pt is familiar to this OP clinic. Pt has been wearing a Walking boot for over 4 month which has caused LLD due to foot wear height. Pt's back pain is limiting pt with all functional mobility. Pt goals to return to becoming active with all activities without pain. Pt R hip Jt PROM and Palpation negative.  However, Slump Test positive to RLE, quadrant test positive to R side causing pain in R hip and gluteal region. Pt SLR positive at 50 degrees.  PT also found weakness in B hip, gait deviations limiting pt with all functional mobility evident by LEFS, 30 sec STS, ODI score. Pt has new onset of back pain due to  severe gait deviation due to Walking in a CAM boot  on leg. Pt will benefit from PT Interventions to address impairments and help pt achieve her goals.    OBJECTIVE IMPAIRMENTS: Abnormal gait, decreased activity tolerance, difficulty walking, decreased strength, and pain.   ACTIVITY LIMITATIONS: carrying, lifting, bending, sitting, standing, squatting, and transfers  PARTICIPATION LIMITATIONS: cleaning, laundry, driving, community activity, and occupation  PERSONAL FACTORS: Age and Time since onset of injury/illness/exacerbation are also affecting patient's functional outcome.   REHAB POTENTIAL: Good  CLINICAL DECISION MAKING: Stable/uncomplicated  EVALUATION COMPLEXITY: Low   GOALS: Goals reviewed with patient? Yes  SHORT TERM GOALS: Target date: 09/05/2023 Pt will become Independent with HEP to promote functional mobility Baseline: HEP to be furnished next session Goal status: INITIAL    LONG TERM GOALS: Target date: 09/30/2023   Pt will reduce pain with all function to 2/10 to improve QOL Baseline:  Goal status: INITIAL  2.  Pt will complete >15 STS reps in 30 secs to demonstrate improve BLE strength Baseline: 10 Goal status: INITIAL  3.  Pt will imporve LEFS score to >50/80 to demonstrate progress with LE functions and QOL Baseline: 12/80 Goal status: INITIAL  4.  Pt will become SLR/Slump Test negative with pain Baseline: Positve Goal status: INITIAL  5.  Pt will improve ODI score to <15/50 to demonstrate improve pain and  QOL.  Baseline: 30/50 Goal status: INITIAL   PLAN:  PT FREQUENCY: 1-2x/week  PT DURATION: 8 weeks  PLANNED INTERVENTIONS:  97110-Therapeutic exercises, 97530- Therapeutic activity, [id number]- Neuromuscular re-education, 97535- Self Care, 16109- Manual therapy, and 97014- Electrical stimulation (unattended)  PLAN FOR NEXT SESSION: Continue as per POC.   Maylon Peppers, PT, DPT Physical Therapist - [place]  [facility]  4:45 PM,08/09/23

## 2023-08-09 ENCOUNTER — Encounter: Payer: Self-pay | Admitting: Physical Therapy

## 2023-08-09 ENCOUNTER — Ambulatory Visit: Payer: BC Managed Care – PPO | Admitting: Physical Therapy

## 2023-08-09 DIAGNOSIS — R262 Difficulty in walking, not elsewhere classified: Secondary | ICD-10-CM | POA: Diagnosis not present

## 2023-08-09 DIAGNOSIS — M5459 Other low back pain: Secondary | ICD-10-CM

## 2023-08-09 DIAGNOSIS — M6281 Muscle weakness (generalized): Secondary | ICD-10-CM

## 2023-08-15 NOTE — Therapy (Signed)
 OUTPATIENT PHYSICAL THERAPY THORACOLUMBAR TREATMENT  Patient Name: [name] MRN: [id number] DOB:[birth year], 43 y.o., female Today's Date: 08/16/2023  END OF SESSION:  PT End of Session - 08/16/23 1644     Visit Number 3    Number of Visits 16    Date for PT Re-Evaluation 09/30/23    Authorization Type 08/02/2023    PT Start Time 1645    PT Stop Time 1727    PT Time Calculation (min) 42 min    Activity Tolerance Patient tolerated treatment well    Behavior During Therapy [facility] for tasks assessed/performed               Past Medical History:  Diagnosis Date   Anxiety    Arthritis    right ankle   Bony exostosis 09/2014   right knee   GERD (gastroesophageal reflux disease)    History of migraine    Hypertension    Neuromuscular disorder (HCC)    trigeminal nerve pain   Pain, eye, left 10/10/2014   with tingling - states possible shingles; to see PCP   Painful orthopaedic hardware (HCC) 09/2014   right ankle   Past Surgical History:  Procedure Laterality Date   BONE EXOSTOSIS EXCISION Right 10/16/2014   Procedure: EXCISION OF BONY EXOSTOSIS FROM RIGHT KNEE;  Surgeon: Kay CHRISTELLA Cummins, MD;  Location: [facility];  Service: Orthopedics;  Laterality: Right;   CHOLECYSTECTOMY  07/12/2006   DILATATION & CURRETTAGE/HYSTEROSCOPY WITH RESECTOCOPE N/A 09/24/2022   Procedure: DILATATION & CURETTAGE/HYSTEROSCOPY WITH RESECTOCOPE;  Surgeon: Rutherford Gain, MD;  Location: [facility];  Service: Gynecology;  Laterality: N/A;   ENDOMETRIAL ABLATION N/A 09/24/2022   Procedure: ENDOMETRIAL ABLATION;  Surgeon: Rutherford Gain, MD;  Location: [facility];  Service: Gynecology;  Laterality: N/A;   HARDWARE REMOVAL Right 10/16/2014   Procedure: HARDWARE REMOVAL RIGHT ANKLE;  Surgeon: Kay CHRISTELLA Cummins, MD;  Location: [facility];  Service: Orthopedics;  Laterality: Right;   KNEE ARTHROSCOPY Right 09/01/2007   ORIF ANKLE  FRACTURE BIMALLEOLAR Right 02/28/2007   ORIF TIBIA FRACTURE Right 02/28/2007   rod placement right   TIBIA HARDWARE REMOVAL Right 09/01/2007   There are no active problems to display for this patient.   PCP: Lynwood Hunt MD  REFERRING PROVIDER: Cruz Lev PA  REFERRING DIAG:  M54.50 (ICD-10-CM) - Lumbago  M25.551 (ICD-10-CM) - Right hip pain    THERAPY DIAG:  Low Back pain with radiculopathy to R hip, Muscle weakness  Rationale for Evaluation and Treatment: Rehabilitation  ONSET DATE: 06/02/2023  SUBJECTIVE:   SUBJECTIVE STATEMENT: Pt reported:  One morning in November, I just woke up with back and hip pain. Every so often I cannot get up out of bed because my back feels locked and hurts. My pain goes into R hip. My R hip hurts 80 to 90 % of the time. 6/10 and at times it goes as high as 10/10. Sometimes my pain travels up towards my lower part of the neck. The pain is shooting and constant. It limits me form doing all my activities. Walking, cleaning, sitting, bending, squatting and lifting are all very painful and am limited.   PERTINENT HISTORY:  Pt is concern of pain to bilateral lower back and right hip pain. Her lower back pain began around six weeks ago without trauma or injury. She was seen by Telehealth with [facility] on 06/27/23 for back pain that had been present for one week. No trauma or  injury prior to onset. She has been wearing a short CAM walking boot for foot fracture. Recent MRI shows Stress Reaction and no fracture. Pt advised to continue ot wear the boot. She was given prednisone 40 x 5 days. She did have relief while on prednisone but feels that pain worsened after completion of treatment. She has also had newer right hip pain for the past week or so. Bending forward, sitting, or movement while laying down causes worsening of pain. Walking eases her pain. She denies associated bowel incontinence, bladder incontinence, or saddle anesthesia. She has had some  radiation of pain to her right leg but this has not been consistent or persistent. She is already taking meloxicam  daily for her foot but does not feel that it alleviates any of her back pain.   PAIN:  Are you having pain? Yes: NPRS scale: 6 to 10/10 pain Pain location: In lower back into gluteal area into hip. Pain description: Shooting and constant Aggravating factors: activities of daily living Relieving factors: Heat, lying on back with heating pad  PRECAUTIONS: None  RED FLAGS: None   WEIGHT BEARING RESTRICTIONS: No Needs to wear Camboot.   FALLS:  Has patient fallen in last 6 months? No  LIVING ENVIRONMENT: Lives with: lives with their family and lives with their daughter Lives in: House/apartment Stairs: Yes: Internal: flight steps; on left going up Has following equipment at home: None  OCCUPATION: Herbalist in a Transport planner office.   PLOF: Independent  PATIENT GOALS:  I want to be able to daily activities without Pain.  NEXT MD VISIT: 08/11/2023  OBJECTIVE:  Note: Objective measures were completed at Evaluation unless otherwise noted.  DIAGNOSTIC FINDINGS: MRI completed.   PATIENT SURVEYS:  Modified Oswestry 30/50 severe disability  LEFS 12/80 FOTO 36/60   COGNITION: Overall cognitive status: Within functional limits for tasks assessed     SENSATION: WFL   MUSCLE LENGTH: WFL   POSTURE: rounded shoulders and decreased lumbar lordosis  PALPATION: Pain  to L4/L5 and B SI  LOWER EXTREMITY ROM:  WFL RLE pain with SLR.     LOWER EXTREMITY MMT:  L hip 3/5 in all planes, R hip 3-/5 2/2 to pain., B knees 4/5, L ankle 4/5 R ankle deferred 2/2 to injury.    FUNCTIONAL TESTS:  30 seconds chair stand test: 10 reps completed 10 meter walk test: in 9 secs  FABER test Positive, SI compression positive, SLR positive to R, and R Slump test positive GAIT: Distance walked: 178ft Assistive device utilized: None Level of assistance: Complete  Independence Comments: Pt walks in CAMBOOT with apin during RLE stance phase. Heel strike absent in RLE.                                                                                                                                 TREATMENT DATE: 08/16/23    Subjective: Patient reports she did too much on  Sunday while taking her christmas tree down. Reports 4-5/10 pain in low back    Manual:  General stretching in supine (lower trunk rotation, knee to chest, hamstring piriformis) x 10 minutes Use of hypervolt massage to lumbar paraspinals x 5 minutes   TE: Nustep level 3 x 8 minutes to improve lumbar mobility and cardiorespiratory endurance   Supine bridge 2 x 10  Supine lower trunk rotation x 10 each side  Seated piriformis stretch 2 x 30 seconds each  Seated hamstring stretch 2 x 30 seconds each LE  Seated trunk flexion x 5   PATIENT EDUCATION: Educated patient on HEP handout and use of tennis ball against wall for self trigger point release at piriformis and reiterated need for equal shoe height to minimize LLD with use of CAM boot, patient verbalized understanding   PATIENT EDUCATION:  Education details: As above Person educated: Patient Education method: Explanation Education comprehension: verbalized understanding  HOME EXERCISE PROGRAM: Access Code: [email or web address] URL: [email or web address] Date: 08/09/2023 Prepared by: Maryanne Finder  Exercises - Seated Piriformis Stretch  - 2-3 x daily - 5-7 x weekly - 3-5 reps - 30-60 sec hold - Seated Flexion Stretch  - 2-3 x daily - 5-7 x weekly - 10 reps - 5 sec hold - Supine Lower Trunk Rotation  - 2 x daily - 5-7 x weekly - 1-2 sets - 10 reps - 3-5 sec hold - Supine Single Knee to Chest Stretch  - 2 x daily - 5-7 x weekly - 1-2 sets - 10 reps - 3-5 sec hold - Supine Double Knee to Chest  - 2-3 x daily - 5-7 x weekly - 1-2 sets - 10 reps - 3-5 sec hold  ASSESSMENT:  CLINICAL IMPRESSION:    Patient arrives to  treatment with complaints of back pain. Session focused on stretching to improve piriformis and hamstring length and core strengthening. Patient with notable hamstring tightness bilaterally. Introduced seated hamstring stretch for HEP use.  Patient will continue to benefit from skilled therapy to address remaining deficits in order to improve quality of life and return to PLOF.    OBJECTIVE IMPAIRMENTS: Abnormal gait, decreased activity tolerance, difficulty walking, decreased strength, and pain.   ACTIVITY LIMITATIONS: carrying, lifting, bending, sitting, standing, squatting, and transfers  PARTICIPATION LIMITATIONS: cleaning, laundry, driving, community activity, and occupation  PERSONAL FACTORS: Age and Time since onset of injury/illness/exacerbation are also affecting patient's functional outcome.   REHAB POTENTIAL: Good  CLINICAL DECISION MAKING: Stable/uncomplicated  EVALUATION COMPLEXITY: Low   GOALS: Goals reviewed with patient? Yes  SHORT TERM GOALS: Target date: 09/05/2023 Pt will become Independent with HEP to promote functional mobility Baseline: HEP to be furnished next session Goal status: INITIAL    LONG TERM GOALS: Target date: 09/30/2023   Pt will reduce pain with all function to 2/10 to improve QOL Baseline:  Goal status: INITIAL  2.  Pt will complete >15 STS reps in 30 secs to demonstrate improve BLE strength Baseline: 10 Goal status: INITIAL  3.  Pt will imporve LEFS score to >50/80 to demonstrate progress with LE functions and QOL Baseline: 12/80 Goal status: INITIAL  4.  Pt will become SLR/Slump Test negative with pain Baseline: Positve Goal status: INITIAL  5.  Pt will improve ODI score to <15/50 to demonstrate improve pain and QOL.  Baseline: 30/50 Goal status: INITIAL   PLAN:  PT FREQUENCY: 1-2x/week  PT DURATION: 8 weeks  PLANNED INTERVENTIONS: 97110-Therapeutic exercises, 97530- Therapeutic activity, [id number]- Neuromuscular re-education,  97535- Self  Care, 02859- Manual therapy, and 97014- Electrical stimulation (unattended)  PLAN FOR NEXT SESSION: Continue as per POC.   Maryanne Finder, PT, DPT Physical Therapist - [place]  [facility]  4:45 PM,08/16/23

## 2023-08-16 ENCOUNTER — Ambulatory Visit: Payer: BC Managed Care – PPO | Attending: Family Medicine

## 2023-08-16 ENCOUNTER — Encounter: Payer: Self-pay | Admitting: Physical Therapy

## 2023-08-16 DIAGNOSIS — M25571 Pain in right ankle and joints of right foot: Secondary | ICD-10-CM | POA: Insufficient documentation

## 2023-08-16 DIAGNOSIS — R42 Dizziness and giddiness: Secondary | ICD-10-CM | POA: Diagnosis present

## 2023-08-16 DIAGNOSIS — M6281 Muscle weakness (generalized): Secondary | ICD-10-CM | POA: Diagnosis present

## 2023-08-16 DIAGNOSIS — R262 Difficulty in walking, not elsewhere classified: Secondary | ICD-10-CM | POA: Insufficient documentation

## 2023-08-16 DIAGNOSIS — M5459 Other low back pain: Secondary | ICD-10-CM | POA: Diagnosis present

## 2023-08-18 ENCOUNTER — Ambulatory Visit: Payer: BC Managed Care – PPO | Admitting: Physical Therapy

## 2023-08-18 DIAGNOSIS — M25571 Pain in right ankle and joints of right foot: Secondary | ICD-10-CM

## 2023-08-18 DIAGNOSIS — M5459 Other low back pain: Secondary | ICD-10-CM

## 2023-08-18 DIAGNOSIS — R42 Dizziness and giddiness: Secondary | ICD-10-CM

## 2023-08-18 DIAGNOSIS — R262 Difficulty in walking, not elsewhere classified: Secondary | ICD-10-CM

## 2023-08-18 DIAGNOSIS — M6281 Muscle weakness (generalized): Secondary | ICD-10-CM

## 2023-08-18 NOTE — Therapy (Signed)
 OUTPATIENT PHYSICAL THERAPY TREATMENT  Patient Name: [name] MRN: [id number] DOB:1981-05-24, 43 y.o., female Today's Date: 08/18/2023  END OF SESSION:  PT End of Session - 08/18/23 1648     Visit Number 4    Number of Visits 16    Date for PT Re-Evaluation 09/30/23    PT Start Time 1645    PT Stop Time 1730    PT Time Calculation (min) 45 min    Activity Tolerance Patient tolerated treatment well;No increased pain    Behavior During Therapy [facility] for tasks assessed/performed               Past Medical History:  Diagnosis Date   Anxiety    Arthritis    right ankle   Bony exostosis 09/2014   right knee   GERD (gastroesophageal reflux disease)    History of migraine    Hypertension    Neuromuscular disorder (HCC)    trigeminal nerve pain   Pain, eye, left 10/10/2014   with tingling - states possible shingles; to see PCP   Painful orthopaedic hardware (HCC) 09/2014   right ankle   Past Surgical History:  Procedure Laterality Date   BONE EXOSTOSIS EXCISION Right 10/16/2014   Procedure: EXCISION OF BONY EXOSTOSIS FROM RIGHT KNEE;  Surgeon: Kay CHRISTELLA Cummins, MD;  Location: [facility];  Service: Orthopedics;  Laterality: Right;   CHOLECYSTECTOMY  07/12/2006   DILATATION & CURRETTAGE/HYSTEROSCOPY WITH RESECTOCOPE N/A 09/24/2022   Procedure: DILATATION & CURETTAGE/HYSTEROSCOPY WITH RESECTOCOPE;  Surgeon: Rutherford Gain, MD;  Location: [facility];  Service: Gynecology;  Laterality: N/A;   ENDOMETRIAL ABLATION N/A 09/24/2022   Procedure: ENDOMETRIAL ABLATION;  Surgeon: Rutherford Gain, MD;  Location: [facility];  Service: Gynecology;  Laterality: N/A;   HARDWARE REMOVAL Right 10/16/2014   Procedure: HARDWARE REMOVAL RIGHT ANKLE;  Surgeon: Kay CHRISTELLA Cummins, MD;  Location:  SUR[facility]ERY [facility];  Service: Orthopedics;  Laterality: Right;   KNEE ARTHROSCOPY Right 09/01/2007   ORIF ANKLE FRACTURE BIMALLEOLAR Right  02/28/2007   ORIF TIBIA FRACTURE Right 02/28/2007   rod placement right   TIBIA HARDWARE REMOVAL Right 09/01/2007   There are no active problems to display for this patient.   PCP: Lynwood Hunt MD  REFERRING PROVIDER: Cruz Lev PA  REFERRING DIAG:  M54.50 (ICD-10-CM) - Lumbago  M25.551 (ICD-10-CM) - Right hip pain    THERAPY DIAG:  Low Back pain with radiculopathy to R hip, Muscle weakness  Rationale for Evaluation and Treatment: Rehabilitation  ONSET DATE: 06/02/2023  SUBJECTIVE:   SUBJECTIVE STATEMENT: Pt reports morning pain remains pretty bad, improved by 11am. New hamstrings stretch addition to HEP is perceived as helpful overall. Other HEP going fine in general. Pt has self DC CAM Rocker on 08/15/23, foot has felt fine, but she plans to visit [employer] [facility] for orthotic footwear.   PERTINENT HISTORY:  Pt is concern of pain to bilateral lower back and right hip pain. Her lower back pain began around six weeks ago without trauma or injury. She was seen by Telehealth with [facility] on 06/27/23 for back pain that had been present for one week. No trauma or injury prior to onset. She has been wearing a short CAM walking boot for foot fracture. Recent MRI shows Stress Reaction and no fracture. Pt advised to continue ot wear the boot. She was given prednisone 40 x 5 days. She did have relief while on prednisone but feels that pain worsened after completion of  treatment. She has also had newer right hip pain for the past week or so. Bending forward, sitting, or movement while laying down causes worsening of pain. Walking eases her pain. She denies associated bowel incontinence, bladder incontinence, or saddle anesthesia. She has had some radiation of pain to her right leg but this has not been consistent or persistent. She is already taking meloxicam  daily for her foot but does not feel that it alleviates any of her back pain. Pt has self DC CAM Rocker on 08/15/23, foot has felt fine, but  she plans to visit [employer] [facility] for orthotic footwear.    PAIN:  Are you having pain? Yes, Rt posterior pelvis    PRECAUTIONS: None   WEIGHT BEARING RESTRICTIONS: WBAT in CAM Rocker Boot (Since September); pt has since self DC boot on 08/15/24  FALLS:  Has patient fallen in last 6 months? No  LIVING ENVIRONMENT: Lives with: lives with their family and lives with their daughter Lives in: House/apartment Stairs: Yes: Internal: flight steps; on left going up Has following equipment at home: None  OCCUPATION: Herbalist in a Transport planner office.   PLOF: Independent  PATIENT GOALS: I want to be able to daily activities without pain.  NEXT MD VISIT: 08/11/2023  OBJECTIVE:  Note: Objective measures were completed at Evaluation unless otherwise noted.  DIAGNOSTIC FINDINGS: MRI completed.   PATIENT SURVEYS:  Modified Oswestry 30/50 severe disability  LEFS 12/80 FOTO 36/60  COGNITION: Overall cognitive status: Within functional limits for tasks assessed     SENSATION: WFL   POSTURE: rounded shoulders and decreased lumbar lordosis  PALPATION: Pain  to L4/L5 and B SI  LOWER EXTREMITY ROM:  WFL RLE pain with SLR.   LOWER EXTREMITY MMT:  L hip 3/5 in all planes, R hip 3-/5 2/2 to pain., B knees 4/5, L ankle 4/5 R ankle deferred 2/2 to injury.  FUNCTIONAL TESTS:  30 seconds chair stand test: 10 reps completed 10 meter walk test: in 9 secs  FABER test Positive, SI compression positive, SLR positive to R, and R Slump test positive   SPECIAL TESTS:  -A/SLR test: pretest = painful, weak RT SLR, not much improvement when paired with pelvic compression, however noted  (08/18/23)                                                                                                                                TREATMENT DATE: 08/18/23  -Supine on hot pack -trigger point release to Rt posterior gluteals between crest and trochanter level x10 minutes  -Rt hooklying  SKTC stretch 2x30secH  -Rt piriformis stretrch 2x45secH  -Hooklying Green TB clam 15x3secH  -Hooklying bridge c ball squeeze 1x10 -A/SLR test: pretest = painful, weak RT SLR, not much improvement when paired with pelvic compression, however noted  -seated BLE hip IR with yellow TB mid calf 1x10 (ball between knees)  -seated greenTB clams x15 -seated BLE hip IR  with yellow TB mid calf 1x10 (ball between knees)    PATIENT EDUCATION:  Education details: As above Person educated: Patient Education method: Explanation Education comprehension: verbalized understanding  HOME EXERCISE PROGRAM: Access Code: [email or web address] URL: [email or web address] Date: 08/09/2023 Prepared by: Maryanne Finder  Exercises - Seated Piriformis Stretch  - 2-3 x daily - 5-7 x weekly - 3-5 reps - 30-60 sec hold - Seated Flexion Stretch  - 2-3 x daily - 5-7 x weekly - 10 reps - 5 sec hold - Supine Lower Trunk Rotation  - 2 x daily - 5-7 x weekly - 1-2 sets - 10 reps - 3-5 sec hold - Supine Single Knee to Chest Stretch  - 2 x daily - 5-7 x weekly - 1-2 sets - 10 reps - 3-5 sec hold - Supine Double Knee to Chest  - 2-3 x daily - 5-7 x weekly - 1-2 sets - 10 reps - 3-5 sec hold  ASSESSMENT:  CLINICAL IMPRESSION:    Symptoms slowly improving overall, however can still be quite intense at different periods of the day. Differential diagnosis remains questionable despite  negative A/SLR test, still question potential etiology from Rt sacroiliitis. Orther aspects also consistent with some nerve root irritation. No changes to HEP today. Added more isolated resistance this date for hip ER/IR. Patient will continue to benefit from skilled therapy to address remaining deficits in order to improve quality of life and return to PLOF.    OBJECTIVE IMPAIRMENTS: Abnormal gait, decreased activity tolerance, difficulty walking, decreased strength, and pain.   ACTIVITY LIMITATIONS: carrying, lifting, bending, sitting,  standing, squatting, and transfers  PARTICIPATION LIMITATIONS: cleaning, laundry, driving, community activity, and occupation  PERSONAL FACTORS: Age and Time since onset of injury/illness/exacerbation are also affecting patient's functional outcome.   REHAB POTENTIAL: Good  CLINICAL DECISION MAKING: Stable/uncomplicated  EVALUATION COMPLEXITY: Low   GOALS: Goals reviewed with patient? Yes  SHORT TERM GOALS: Target date: 09/05/2023 Pt will become Independent with HEP to promote functional mobility Baseline: HEP to be furnished next session Goal status: INITIAL   LONG TERM GOALS: Target date: 09/30/2023   Pt will reduce pain with all function to 2/10 to improve QOL Baseline:  Goal status: INITIAL  2.  Pt will complete >15 STS reps in 30 secs to demonstrate improve BLE strength Baseline: 10 Goal status: INITIAL  3.  Pt will imporve LEFS score to >50/80 to demonstrate progress with LE functions and QOL Baseline: 12/80 Goal status: INITIAL  4.  Pt will become SLR/Slump Test negative with pain Baseline: Positve Goal status: INITIAL  5.  Pt will improve ODI score to <15/50 to demonstrate improve pain and QOL.  Baseline: 30/50 Goal status: INITIAL   PLAN:  PT FREQUENCY: 1-2x/week  PT DURATION: 8 weeks  PLANNED INTERVENTIONS: 97110-Therapeutic exercises, 97530- Therapeutic activity, 97112- Neuromuscular re-education, 97535- Self Care, 02859- Manual therapy, and 97014- Electrical stimulation (unattended)  PLAN FOR NEXT SESSION: Continue as per POC.   4:57 PM, 08/18/23 Peggye JAYSON Linear, PT, DPT Physical Therapist - [facility] in [place]  [phone] (Office)     4:57 PM,08/18/23

## 2023-08-23 ENCOUNTER — Ambulatory Visit: Payer: BC Managed Care – PPO

## 2023-08-23 ENCOUNTER — Encounter: Payer: Self-pay | Admitting: Physical Therapy

## 2023-08-23 DIAGNOSIS — M6281 Muscle weakness (generalized): Secondary | ICD-10-CM | POA: Diagnosis not present

## 2023-08-23 DIAGNOSIS — R262 Difficulty in walking, not elsewhere classified: Secondary | ICD-10-CM

## 2023-08-23 DIAGNOSIS — M5459 Other low back pain: Secondary | ICD-10-CM

## 2023-08-23 NOTE — Therapy (Signed)
OUTPATIENT PHYSICAL THERAPY TREATMENT  Patient Name: [name] MRN: [id number] DOB:[birth year], 43 y.o., female Today's Date: 08/23/2023  END OF SESSION:  PT End of Session - 08/23/23 1652     Visit Number 5    Number of Visits 16    Date for PT Re-Evaluation 09/30/23    PT Start Time 1646    PT Stop Time 1728    PT Time Calculation (min) 42 min    Activity Tolerance Patient tolerated treatment well;No increased pain    Behavior During Therapy [facility] for tasks assessed/performed                Past Medical History:  Diagnosis Date   Anxiety    Arthritis    right ankle   Bony exostosis 09/2014   right knee   GERD (gastroesophageal reflux disease)    History of migraine    Hypertension    Neuromuscular disorder (HCC)    trigeminal nerve pain   Pain, eye, left 10/10/2014   with tingling - states possible shingles; to see PCP   Painful orthopaedic hardware (HCC) 09/2014   right ankle   Past Surgical History:  Procedure Laterality Date   BONE EXOSTOSIS EXCISION Right 10/16/2014   Procedure: EXCISION OF BONY EXOSTOSIS FROM RIGHT KNEE;  Surgeon: Tarry Kos, MD;  Location: [facility];  Service: Orthopedics;  Laterality: Right;   CHOLECYSTECTOMY  07/12/2006   DILATATION & CURRETTAGE/HYSTEROSCOPY WITH RESECTOCOPE N/A 09/24/2022   Procedure: DILATATION & CURETTAGE/HYSTEROSCOPY WITH RESECTOCOPE;  Surgeon: Maxie Better, MD;  Location: [facility];  Service: Gynecology;  Laterality: N/A;   ENDOMETRIAL ABLATION N/A 09/24/2022   Procedure: ENDOMETRIAL ABLATION;  Surgeon: Maxie Better, MD;  Location: [facility];  Service: Gynecology;  Laterality: N/A;   HARDWARE REMOVAL Right 10/16/2014   Procedure: HARDWARE REMOVAL RIGHT ANKLE;  Surgeon: Tarry Kos, MD;  Location: [facility];  Service: Orthopedics;  Laterality: Right;   KNEE ARTHROSCOPY Right 09/01/2007   ORIF ANKLE FRACTURE BIMALLEOLAR Right  02/28/2007   ORIF TIBIA FRACTURE Right 02/28/2007   rod placement right   TIBIA HARDWARE REMOVAL Right 09/01/2007   There are no active problems to display for this patient.   PCP: Mickel Baas MD  REFERRING PROVIDER: Cyndia Diver PA  REFERRING DIAG:  M54.50 (ICD-10-CM) - Lumbago  M25.551 (ICD-10-CM) - Right hip pain    THERAPY DIAG:  Low Back pain with radiculopathy to R hip, Muscle weakness  Rationale for Evaluation and Treatment: Rehabilitation  ONSET DATE: 06/02/2023  SUBJECTIVE:   PERTINENT HISTORY:  Pt is concern of pain to bilateral lower back and right hip pain. Her lower back pain began around six weeks ago without trauma or injury. She was seen by Telehealth with [facility] on 06/27/23 for back pain that had been present for one week. No trauma or injury prior to onset. She has been wearing a short CAM walking boot for foot fracture. Recent MRI shows Stress Reaction and no fracture. Pt advised to continue ot wear the boot. She was given prednisone 40 x 5 days. She did have relief while on prednisone but feels that pain worsened after completion of treatment. She has also had newer right hip pain for the past week or so. Bending forward, sitting, or movement while laying down causes worsening of pain. Walking eases her pain. She denies associated bowel incontinence, bladder incontinence, or saddle anesthesia. She has had some radiation of pain to her right leg but  this has not been consistent or persistent. She is already taking meloxicam daily for her foot but does not feel that it alleviates any of her back pain. Pt has self DC CAM Rocker on 08/15/23, foot has felt fine, but she plans to visit [employer] [facility] for orthotic footwear.    PAIN:  Are you having pain? Yes, Rt posterior pelvis    PRECAUTIONS: None   WEIGHT BEARING RESTRICTIONS: WBAT in CAM Rocker Boot (Since September); pt has since self DC boot on 08/15/24  FALLS:  Has patient fallen in last 6 months?  No  LIVING ENVIRONMENT: Lives with: lives with their family and lives with their daughter Lives in: House/apartment Stairs: Yes: Internal: flight steps; on left going up Has following equipment at home: None  OCCUPATION: Herbalist in a Transport planner office.   PLOF: Independent  PATIENT GOALS: "I want to be able to daily activities without pain."  NEXT MD VISIT: 08/11/2023  OBJECTIVE:  Note: Objective measures were completed at Evaluation unless otherwise noted.  DIAGNOSTIC FINDINGS: MRI completed.   PATIENT SURVEYS:  Modified Oswestry 30/50 severe disability  LEFS 12/80 FOTO 36/60  COGNITION: Overall cognitive status: Within functional limits for tasks assessed     SENSATION: WFL   POSTURE: rounded shoulders and decreased lumbar lordosis  PALPATION: Pain  to L4/L5 and B SI  LOWER EXTREMITY ROM:  WFL RLE pain with SLR.   LOWER EXTREMITY MMT:  L hip 3/5 in all planes, R hip 3-/5 2/2 to pain., B knees 4/5, L ankle 4/5 R ankle deferred 2/2 to injury.  FUNCTIONAL TESTS:  30 seconds chair stand test: 10 reps completed 10 meter walk test: in 9 secs  FABER test Positive, SI compression positive, SLR positive to R, and R Slump test positive   SPECIAL TESTS:  -A/SLR test: pretest = painful, weak RT SLR, not much improvement when paired with pelvic compression, however noted  (08/18/23)                                                                                                                                TREATMENT DATE: 08/23/23  Subjective: Patient reports 6/10 pain in low back and R hip  Nustep level 3 x 8 minutes for cardiorespiratory endurance and to improve lumbar mobility   -Supine on hot pack and during Nustep   Supine lumbar rotation stretch 2 x 30 seconds each side  Supine bridge with GTB abduction 2 x 10  Sidelying clam shells with GTB 2 x 10 each side  Quadruped bird dogs x 10      PATIENT EDUCATION:  Education details: As  above Person educated: Patient Education method: Explanation Education comprehension: verbalized understanding  HOME EXERCISE PROGRAM: Access Code: [email or web address] URL: [email or web address] Date: 08/09/2023 Prepared by: Maylon Peppers  Exercises - Seated Piriformis Stretch  - 2-3 x daily - 5-7 x weekly - 3-5 reps - 30-60 sec hold - Seated Flexion Stretch  -  2-3 x daily - 5-7 x weekly - 10 reps - 5 sec hold - Supine Lower Trunk Rotation  - 2 x daily - 5-7 x weekly - 1-2 sets - 10 reps - 3-5 sec hold - Supine Single Knee to Chest Stretch  - 2 x daily - 5-7 x weekly - 1-2 sets - 10 reps - 3-5 sec hold - Supine Double Knee to Chest  - 2-3 x daily - 5-7 x weekly - 1-2 sets - 10 reps - 3-5 sec hold  Access Code: [email or web address] URL: [email or web address] Date: 08/23/2023 Prepared by: Maylon Peppers  Exercises - Supine Bridge with Resistance Band  - 2-3 x daily - 5-7 x weekly - 3 sets - 10 reps - Clamshell with Resistance  - 2-3 x daily - 5-7 x weekly - 3 sets - 10 reps - Bird Dog  - 2-3 x daily - 5-7 x weekly - 2 sets - 10 reps  ASSESSMENT:  CLINICAL IMPRESSION:      Patient arrives to treatment with 6/10 pain in low back and R hip. Session focused on hip abduction strengthening and updating HEP with clamshells, bridge with hip abduction, and bird dog. Tolerated session well with reduction in pain to 4/10. Patient will continue to benefit from skilled therapy to address remaining deficits in order to improve quality of life and return to PLOF.     OBJECTIVE IMPAIRMENTS: Abnormal gait, decreased activity tolerance, difficulty walking, decreased strength, and pain.   ACTIVITY LIMITATIONS: carrying, lifting, bending, sitting, standing, squatting, and transfers  PARTICIPATION LIMITATIONS: cleaning, laundry, driving, community activity, and occupation  PERSONAL FACTORS: Age and Time since onset of injury/illness/exacerbation are also affecting patient's functional outcome.    REHAB POTENTIAL: Good  CLINICAL DECISION MAKING: Stable/uncomplicated  EVALUATION COMPLEXITY: Low   GOALS: Goals reviewed with patient? Yes  SHORT TERM GOALS: Target date: 09/05/2023 Pt will become Independent with HEP to promote functional mobility Baseline: HEP to be furnished next session Goal status: INITIAL   LONG TERM GOALS: Target date: 09/30/2023   Pt will reduce pain with all function to 2/10 to improve QOL Baseline:  Goal status: INITIAL  2.  Pt will complete >15 STS reps in 30 secs to demonstrate improve BLE strength Baseline: 10 Goal status: INITIAL  3.  Pt will imporve LEFS score to >50/80 to demonstrate progress with LE functions and QOL Baseline: 12/80 Goal status: INITIAL  4.  Pt will become SLR/Slump Test negative with pain Baseline: Positve Goal status: INITIAL  5.  Pt will improve ODI score to <15/50 to demonstrate improve pain and QOL.  Baseline: 30/50 Goal status: INITIAL   PLAN:  PT FREQUENCY: 1-2x/week  PT DURATION: 8 weeks  PLANNED INTERVENTIONS: 97110-Therapeutic exercises, 97530- Therapeutic activity, 97112- Neuromuscular re-education, 97535- Self Care, 47829- Manual therapy, and 97014- Electrical stimulation (unattended)  PLAN FOR NEXT SESSION: Continue as per POC.   4:53 PM, 08/23/23 Maylon Peppers, PT, DPT Physical Therapist - [place]  [facility]

## 2023-08-24 NOTE — Therapy (Incomplete)
OUTPATIENT PHYSICAL THERAPY TREATMENT  Patient Name: [name] MRN: [id number] DOB:02/18/1981, 43 y.o., female Today's Date: 08/24/2023  END OF SESSION:       Past Medical History:  Diagnosis Date   Anxiety    Arthritis    right ankle   Bony exostosis 09/2014   right knee   GERD (gastroesophageal reflux disease)    History of migraine    Hypertension    Neuromuscular disorder (HCC)    trigeminal nerve pain   Pain, eye, left 10/10/2014   with tingling - states possible shingles; to see PCP   Painful orthopaedic hardware (HCC) 09/2014   right ankle   Past Surgical History:  Procedure Laterality Date   BONE EXOSTOSIS EXCISION Right 10/16/2014   Procedure: EXCISION OF BONY EXOSTOSIS FROM RIGHT KNEE;  Surgeon: Tarry Kos, MD;  Location: [facility];  Service: Orthopedics;  Laterality: Right;   CHOLECYSTECTOMY  07/12/2006   DILATATION & CURRETTAGE/HYSTEROSCOPY WITH RESECTOCOPE N/A 09/24/2022   Procedure: DILATATION & CURETTAGE/HYSTEROSCOPY WITH RESECTOCOPE;  Surgeon: Maxie Better, MD;  Location: [facility];  Service: Gynecology;  Laterality: N/A;   ENDOMETRIAL ABLATION N/A 09/24/2022   Procedure: ENDOMETRIAL ABLATION;  Surgeon: Maxie Better, MD;  Location: [facility];  Service: Gynecology;  Laterality: N/A;   HARDWARE REMOVAL Right 10/16/2014   Procedure: HARDWARE REMOVAL RIGHT ANKLE;  Surgeon: Tarry Kos, MD;  Location:  S[facility]ERY [facility];  Service: Orthopedics;  Laterality: Right;   KNEE ARTHROSCOPY Right 09/01/2007   ORIF ANKLE FRACTURE BIMALLEOLAR Right 02/28/2007   ORIF TIBIA FRACTURE Right 02/28/2007   rod placement right   TIBIA HARDWARE REMOVAL Right 09/01/2007   There are no active problems to display for this patient.   PCP: Mickel Baas MD  REFERRING PROVIDER: Cyndia Diver PA  REFERRING DIAG:  M54.50 (ICD-10-CM) - Lumbago  M25.551 (ICD-10-CM) - Right hip pain    THERAPY  DIAG:  Low Back pain with radiculopathy to R hip, Muscle weakness  Rationale for Evaluation and Treatment: Rehabilitation  ONSET DATE: 06/02/2023  SUBJECTIVE:   PERTINENT HISTORY:  Pt is concern of pain to bilateral lower back and right hip pain. Her lower back pain began around six weeks ago without trauma or injury. She was seen by Telehealth with [facility] on 06/27/23 for back pain that had been present for one week. No trauma or injury prior to onset. She has been wearing a short CAM walking boot for foot fracture. Recent MRI shows Stress Reaction and no fracture. Pt advised to continue ot wear the boot. She was given prednisone 40 x 5 days. She did have relief while on prednisone but feels that pain worsened after completion of treatment. She has also had newer right hip pain for the past week or so. Bending forward, sitting, or movement while laying down causes worsening of pain. Walking eases her pain. She denies associated bowel incontinence, bladder incontinence, or saddle anesthesia. She has had some radiation of pain to her right leg but this has not been consistent or persistent. She is already taking meloxicam daily for her foot but does not feel that it alleviates any of her back pain. Pt has self DC CAM Rocker on 08/15/23, foot has felt fine, but she plans to visit [employer] [facility] for orthotic footwear.    PAIN:  Are you having pain? Yes, Rt posterior pelvis    PRECAUTIONS: None   WEIGHT BEARING RESTRICTIONS: WBAT in CAM Rocker Boot (Since September); pt has  since self DC boot on 08/15/24  FALLS:  Has patient fallen in last 6 months? No  LIVING ENVIRONMENT: Lives with: lives with their family and lives with their daughter Lives in: House/apartment Stairs: Yes: Internal: flight steps; on left going up Has following equipment at home: None  OCCUPATION: Herbalist in a Transport planner office.   PLOF: Independent  PATIENT GOALS: "I want to be able to daily activities  without pain."  NEXT MD VISIT: 08/11/2023  OBJECTIVE:  Note: Objective measures were completed at Evaluation unless otherwise noted.  DIAGNOSTIC FINDINGS: MRI completed.   PATIENT SURVEYS:  Modified Oswestry 30/50 severe disability  LEFS 12/80 FOTO 36/60  COGNITION: Overall cognitive status: Within functional limits for tasks assessed     SENSATION: WFL   POSTURE: rounded shoulders and decreased lumbar lordosis  PALPATION: Pain  to L4/L5 and B SI  LOWER EXTREMITY ROM:  WFL RLE pain with SLR.   LOWER EXTREMITY MMT:  L hip 3/5 in all planes, R hip 3-/5 2/2 to pain., B knees 4/5, L ankle 4/5 R ankle deferred 2/2 to injury.  FUNCTIONAL TESTS:  30 seconds chair stand test: 10 reps completed 10 meter walk test: in 9 secs  FABER test Positive, SI compression positive, SLR positive to R, and R Slump test positive   SPECIAL TESTS:  -A/SLR test: pretest = painful, weak RT SLR, not much improvement when paired with pelvic compression, however noted  (08/18/23)                                                                                                                                TREATMENT DATE: 08/24/23 ***  Subjective: Patient reports 6/10 pain in low back and R hip  Nustep level 3 x 8 minutes for cardiorespiratory endurance and to improve lumbar mobility   -Supine on hot pack and during Nustep   Supine lumbar rotation stretch 2 x 30 seconds each side  Supine bridge with GTB abduction 2 x 10  Sidelying clam shells with GTB 2 x 10 each side  Quadruped bird dogs x 10      PATIENT EDUCATION:  Education details: As above Person educated: Patient Education method: Explanation Education comprehension: verbalized understanding  HOME EXERCISE PROGRAM: Access Code: [email or web address] URL: [email or web address] Date: 08/09/2023 Prepared by: Maylon Peppers  Exercises - Seated Piriformis Stretch  - 2-3 x daily - 5-7 x weekly - 3-5 reps - 30-60 sec hold - Seated  Flexion Stretch  - 2-3 x daily - 5-7 x weekly - 10 reps - 5 sec hold - Supine Lower Trunk Rotation  - 2 x daily - 5-7 x weekly - 1-2 sets - 10 reps - 3-5 sec hold - Supine Single Knee to Chest Stretch  - 2 x daily - 5-7 x weekly - 1-2 sets - 10 reps - 3-5 sec hold - Supine Double Knee to Chest  - 2-3 x daily - 5-7 x weekly -  1-2 sets - 10 reps - 3-5 sec hold  Access Code: [email or web address] URL: [email or web address] Date: 08/23/2023 Prepared by: Maylon Peppers  Exercises - Supine Bridge with Resistance Band  - 2-3 x daily - 5-7 x weekly - 3 sets - 10 reps - Clamshell with Resistance  - 2-3 x daily - 5-7 x weekly - 3 sets - 10 reps - Bird Dog  - 2-3 x daily - 5-7 x weekly - 2 sets - 10 reps  ASSESSMENT:  CLINICAL IMPRESSION:    ***   Patient arrives to treatment with 6/10 pain in low back and R hip. Session focused on hip abduction strengthening and updating HEP with clamshells, bridge with hip abduction, and bird dog. Tolerated session well with reduction in pain to 4/10. Patient will continue to benefit from skilled therapy to address remaining deficits in order to improve quality of life and return to PLOF.     OBJECTIVE IMPAIRMENTS: Abnormal gait, decreased activity tolerance, difficulty walking, decreased strength, and pain.   ACTIVITY LIMITATIONS: carrying, lifting, bending, sitting, standing, squatting, and transfers  PARTICIPATION LIMITATIONS: cleaning, laundry, driving, community activity, and occupation  PERSONAL FACTORS: Age and Time since onset of injury/illness/exacerbation are also affecting patient's functional outcome.   REHAB POTENTIAL: Good  CLINICAL DECISION MAKING: Stable/uncomplicated  EVALUATION COMPLEXITY: Low   GOALS: Goals reviewed with patient? Yes  SHORT TERM GOALS: Target date: 09/05/2023 Pt will become Independent with HEP to promote functional mobility Baseline: HEP to be furnished next session Goal status: INITIAL   LONG TERM GOALS: Target  date: 09/30/2023   Pt will reduce pain with all function to 2/10 to improve QOL Baseline:  Goal status: INITIAL  2.  Pt will complete >15 STS reps in 30 secs to demonstrate improve BLE strength Baseline: 10 Goal status: INITIAL  3.  Pt will imporve LEFS score to >50/80 to demonstrate progress with LE functions and QOL Baseline: 12/80 Goal status: INITIAL  4.  Pt will become SLR/Slump Test negative with pain Baseline: Positve Goal status: INITIAL  5.  Pt will improve ODI score to <15/50 to demonstrate improve pain and QOL.  Baseline: 30/50 Goal status: INITIAL   PLAN:  PT FREQUENCY: 1-2x/week  PT DURATION: 8 weeks  PLANNED INTERVENTIONS: 97110-Therapeutic exercises, 97530- Therapeutic activity, [id number]- Neuromuscular re-education, 97535- Self Care, [id number]- Manual therapy, and 97014- Electrical stimulation (unattended)  PLAN FOR NEXT SESSION: Continue as per POC.   10:25 AM, 08/24/23 Maylon Peppers, PT, DPT Physical Therapist - [place]  [facility]

## 2023-08-25 ENCOUNTER — Ambulatory Visit: Payer: BC Managed Care – PPO | Admitting: Physical Therapy

## 2023-08-25 DIAGNOSIS — M5459 Other low back pain: Secondary | ICD-10-CM

## 2023-08-25 DIAGNOSIS — R262 Difficulty in walking, not elsewhere classified: Secondary | ICD-10-CM

## 2023-08-25 DIAGNOSIS — M6281 Muscle weakness (generalized): Secondary | ICD-10-CM

## 2023-08-29 NOTE — Therapy (Incomplete)
 OUTPATIENT PHYSICAL THERAPY TREATMENT  Patient Name: [name] MRN: [id number] DOB:09-25-1980, 43 y.o., female Today's Date: 08/29/2023  END OF SESSION:       Past Medical History:  Diagnosis Date   Anxiety    Arthritis    right ankle   Bony exostosis 09/2014   right knee   GERD (gastroesophageal reflux disease)    History of migraine    Hypertension    Neuromuscular disorder (HCC)    trigeminal nerve pain   Pain, eye, left 10/10/2014   with tingling - states possible shingles; to see PCP   Painful orthopaedic hardware (HCC) 09/2014   right ankle   Past Surgical History:  Procedure Laterality Date   BONE EXOSTOSIS EXCISION Right 10/16/2014   Procedure: EXCISION OF BONY EXOSTOSIS FROM RIGHT KNEE;  Surgeon: Tarry Kos, MD;  Location: [facility];  Service: Orthopedics;  Laterality: Right;   CHOLECYSTECTOMY  07/12/2006   DILATATION & CURRETTAGE/HYSTEROSCOPY WITH RESECTOCOPE N/A 09/24/2022   Procedure: DILATATION & CURETTAGE/HYSTEROSCOPY WITH RESECTOCOPE;  Surgeon: Maxie Better, MD;  Location: [facility];  Service: Gynecology;  Laterality: N/A;   ENDOMETRIAL ABLATION N/A 09/24/2022   Procedure: ENDOMETRIAL ABLATION;  Surgeon: Maxie Better, MD;  Location: [facility];  Service: Gynecology;  Laterality: N/A;   HARDWARE REMOVAL Right 10/16/2014   Procedure: HARDWARE REMOVAL RIGHT ANKLE;  Surgeon: Tarry Kos, MD;  Location: [facility];  Service: Orthopedics;  Laterality: Right;   KNEE ARTHROSCOPY Right 09/01/2007   ORIF ANKLE FRACTURE BIMALLEOLAR Right 02/28/2007   ORIF TIBIA FRACTURE Right 02/28/2007   rod placement right   TIBIA HARDWARE REMOVAL Right 09/01/2007   There are no active problems to display for this patient.   PCP: Mickel Baas MD  REFERRING PROVIDER: Cyndia Diver PA  REFERRING DIAG:  M54.50 (ICD-10-CM) - Lumbago  M25.551 (ICD-10-CM) - Right hip pain    THERAPY  DIAG:  Low Back pain with radiculopathy to R hip, Muscle weakness  Rationale for Evaluation and Treatment: Rehabilitation  ONSET DATE: 06/02/2023  SUBJECTIVE:   PERTINENT HISTORY:  Pt is concern of pain to bilateral lower back and right hip pain. Her lower back pain began around six weeks ago without trauma or injury. She was seen by Telehealth with [facility] on 06/27/23 for back pain that had been present for one week. No trauma or injury prior to onset. She has been wearing a short CAM walking boot for foot fracture. Recent MRI shows Stress Reaction and no fracture. Pt advised to continue ot wear the boot. She was given prednisone 40 x 5 days. She did have relief while on prednisone but feels that pain worsened after completion of treatment. She has also had newer right hip pain for the past week or so. Bending forward, sitting, or movement while laying down causes worsening of pain. Walking eases her pain. She denies associated bowel incontinence, bladder incontinence, or saddle anesthesia. She has had some radiation of pain to her right leg but this has not been consistent or persistent. She is already taking meloxicam daily for her foot but does not feel that it alleviates any of her back pain. Pt has self DC CAM Rocker on 08/15/23, foot has felt fine, but she plans to visit [employer] [facility] for orthotic footwear.    PAIN:  Are you having pain? Yes, Rt posterior pelvis    PRECAUTIONS: None   WEIGHT BEARING RESTRICTIONS: WBAT in CAM Rocker Boot (Since September); pt has  since self DC boot on 08/15/24  FALLS:  Has patient fallen in last 6 months? No  LIVING ENVIRONMENT: Lives with: lives with their family and lives with their daughter Lives in: House/apartment Stairs: Yes: Internal: flight steps; on left going up Has following equipment at home: None  OCCUPATION: Herbalist in a Transport planner office.   PLOF: Independent  PATIENT GOALS: "I want to be able to daily activities  without pain."  NEXT MD VISIT: 08/11/2023  OBJECTIVE:  Note: Objective measures were completed at Evaluation unless otherwise noted.  DIAGNOSTIC FINDINGS: MRI completed.   PATIENT SURVEYS:  Modified Oswestry 30/50 severe disability  LEFS 12/80 FOTO 36/60  COGNITION: Overall cognitive status: Within functional limits for tasks assessed     SENSATION: WFL   POSTURE: rounded shoulders and decreased lumbar lordosis  PALPATION: Pain  to L4/L5 and B SI  LOWER EXTREMITY ROM:  WFL RLE pain with SLR.   LOWER EXTREMITY MMT:  L hip 3/5 in all planes, R hip 3-/5 2/2 to pain., B knees 4/5, L ankle 4/5 R ankle deferred 2/2 to injury.  FUNCTIONAL TESTS:  30 seconds chair stand test: 10 reps completed 10 meter walk test: in 9 secs  FABER test Positive, SI compression positive, SLR positive to R, and R Slump test positive   SPECIAL TESTS:  -A/SLR test: pretest = painful, weak RT SLR, not much improvement when paired with pelvic compression, however noted  (08/18/23)                                                                                                                                TREATMENT DATE: 08/29/23 ***  Subjective: Patient reports 6/10 pain in low back and R hip  Nustep level 3 x 8 minutes for cardiorespiratory endurance and to improve lumbar mobility   -Supine on hot pack and during Nustep   Supine lumbar rotation stretch 2 x 30 seconds each side  Supine bridge with GTB abduction 2 x 10  Sidelying clam shells with GTB 2 x 10 each side  Quadruped bird dogs x 10      PATIENT EDUCATION:  Education details: As above Person educated: Patient Education method: Explanation Education comprehension: verbalized understanding  HOME EXERCISE PROGRAM: Access Code: [email or web address] URL: [email or web address] Date: 08/09/2023 Prepared by: Maylon Peppers  Exercises - Seated Piriformis Stretch  - 2-3 x daily - 5-7 x weekly - 3-5 reps - 30-60 sec hold - Seated  Flexion Stretch  - 2-3 x daily - 5-7 x weekly - 10 reps - 5 sec hold - Supine Lower Trunk Rotation  - 2 x daily - 5-7 x weekly - 1-2 sets - 10 reps - 3-5 sec hold - Supine Single Knee to Chest Stretch  - 2 x daily - 5-7 x weekly - 1-2 sets - 10 reps - 3-5 sec hold - Supine Double Knee to Chest  - 2-3 x daily - 5-7 x weekly -  1-2 sets - 10 reps - 3-5 sec hold  Access Code: [email or web address] URL: [email or web address] Date: 08/23/2023 Prepared by: Maylon Peppers  Exercises - Supine Bridge with Resistance Band  - 2-3 x daily - 5-7 x weekly - 3 sets - 10 reps - Clamshell with Resistance  - 2-3 x daily - 5-7 x weekly - 3 sets - 10 reps - Bird Dog  - 2-3 x daily - 5-7 x weekly - 2 sets - 10 reps  ASSESSMENT:  CLINICAL IMPRESSION:    ***   Patient arrives to treatment with 6/10 pain in low back and R hip. Session focused on hip abduction strengthening and updating HEP with clamshells, bridge with hip abduction, and bird dog. Tolerated session well with reduction in pain to 4/10. Patient will continue to benefit from skilled therapy to address remaining deficits in order to improve quality of life and return to PLOF.     OBJECTIVE IMPAIRMENTS: Abnormal gait, decreased activity tolerance, difficulty walking, decreased strength, and pain.   ACTIVITY LIMITATIONS: carrying, lifting, bending, sitting, standing, squatting, and transfers  PARTICIPATION LIMITATIONS: cleaning, laundry, driving, community activity, and occupation  PERSONAL FACTORS: Age and Time since onset of injury/illness/exacerbation are also affecting patient's functional outcome.   REHAB POTENTIAL: Good  CLINICAL DECISION MAKING: Stable/uncomplicated  EVALUATION COMPLEXITY: Low   GOALS: Goals reviewed with patient? Yes  SHORT TERM GOALS: Target date: 09/05/2023 Pt will become Independent with HEP to promote functional mobility Baseline: HEP to be furnished next session Goal status: INITIAL   LONG TERM GOALS: Target  date: 09/30/2023   Pt will reduce pain with all function to 2/10 to improve QOL Baseline:  Goal status: INITIAL  2.  Pt will complete >15 STS reps in 30 secs to demonstrate improve BLE strength Baseline: 10 Goal status: INITIAL  3.  Pt will imporve LEFS score to >50/80 to demonstrate progress with LE functions and QOL Baseline: 12/80 Goal status: INITIAL  4.  Pt will become SLR/Slump Test negative with pain Baseline: Positve Goal status: INITIAL  5.  Pt will improve ODI score to <15/50 to demonstrate improve pain and QOL.  Baseline: 30/50 Goal status: INITIAL   PLAN:  PT FREQUENCY: 1-2x/week  PT DURATION: 8 weeks  PLANNED INTERVENTIONS: 97110-Therapeutic exercises, 97530- Therapeutic activity, 97112- Neuromuscular re-education, 97535- Self Care, 21308- Manual therapy, and 97014- Electrical stimulation (unattended)  PLAN FOR NEXT SESSION: Continue as per POC.   5:07 PM, 08/29/23 Maylon Peppers, PT, DPT Physical Therapist - [place]  [facility]

## 2023-08-30 ENCOUNTER — Ambulatory Visit: Payer: BC Managed Care – PPO | Admitting: Physical Therapy

## 2023-08-30 DIAGNOSIS — R262 Difficulty in walking, not elsewhere classified: Secondary | ICD-10-CM

## 2023-08-30 DIAGNOSIS — M5459 Other low back pain: Secondary | ICD-10-CM

## 2023-08-30 DIAGNOSIS — M6281 Muscle weakness (generalized): Secondary | ICD-10-CM

## 2023-09-01 ENCOUNTER — Ambulatory Visit: Payer: BC Managed Care – PPO

## 2023-09-01 DIAGNOSIS — M6281 Muscle weakness (generalized): Secondary | ICD-10-CM

## 2023-09-01 DIAGNOSIS — M5459 Other low back pain: Secondary | ICD-10-CM

## 2023-09-01 NOTE — Therapy (Signed)
 OUTPATIENT PHYSICAL THERAPY TREATMENT  Patient Name: [name] MRN: [id number] DOB:1981/03/29, 43 y.o., female Today's Date: 09/01/2023  END OF SESSION:  PT End of Session - 09/01/23 1521     Visit Number 6    Number of Visits 16    Date for PT Re-Evaluation 09/30/23    PT Start Time 1522    PT Stop Time 1600    PT Time Calculation (min) 38 min    Activity Tolerance Patient tolerated treatment well;No increased pain    Behavior During Therapy [facility] for tasks assessed/performed            Past Medical History:  Diagnosis Date   Anxiety    Arthritis    right ankle   Bony exostosis 09/2014   right knee   GERD (gastroesophageal reflux disease)    History of migraine    Hypertension    Neuromuscular disorder (HCC)    trigeminal nerve pain   Pain, eye, left 10/10/2014   with tingling - states possible shingles; to see PCP   Painful orthopaedic hardware (HCC) 09/2014   right ankle   Past Surgical History:  Procedure Laterality Date   BONE EXOSTOSIS EXCISION Right 10/16/2014   Procedure: EXCISION OF BONY EXOSTOSIS FROM RIGHT KNEE;  Surgeon: Tarry Kos, MD;  Location: [facility];  Service: Orthopedics;  Laterality: Right;   CHOLECYSTECTOMY  07/12/2006   DILATATION & CURRETTAGE/HYSTEROSCOPY WITH RESECTOCOPE N/A 09/24/2022   Procedure: DILATATION & CURETTAGE/HYSTEROSCOPY WITH RESECTOCOPE;  Surgeon: Maxie Better, MD;  Location: [facility];  Service: Gynecology;  Laterality: N/A;   ENDOMETRIAL ABLATION N/A 09/24/2022   Procedure: ENDOMETRIAL ABLATION;  Surgeon: Maxie Better, MD;  Location: [facility];  Service: Gynecology;  Laterality: N/A;   HARDWARE REMOVAL Right 10/16/2014   Procedure: HARDWARE REMOVAL RIGHT ANKLE;  Surgeon: Tarry Kos, MD;  Location: [facility];  Service: Orthopedics;  Laterality: Right;   KNEE ARTHROSCOPY Right 09/01/2007   ORIF ANKLE FRACTURE BIMALLEOLAR Right 02/28/2007    ORIF TIBIA FRACTURE Right 02/28/2007   rod placement right   TIBIA HARDWARE REMOVAL Right 09/01/2007   There are no active problems to display for this patient.   PCP: Mickel Baas MD  REFERRING PROVIDER: Cyndia Diver PA  REFERRING DIAG:  M54.50 (ICD-10-CM) - Lumbago  M25.551 (ICD-10-CM) - Right hip pain    THERAPY DIAG:  Low Back pain with radiculopathy to R hip, Muscle weakness  Rationale for Evaluation and Treatment: Rehabilitation  ONSET DATE: 06/02/2023  FROM INITIAL EVALUATION: SUBJECTIVE:   PERTINENT HISTORY: Pt is concern of pain to bilateral lower back and right hip pain. Her lower back pain began around six weeks ago without trauma or injury. She was seen by Telehealth with [facility] on 06/27/23 for back pain that had been present for one week. No trauma or injury prior to onset. She has been wearing a short CAM walking boot for foot fracture. Recent MRI shows Stress Reaction and no fracture. Pt advised to continue ot wear the boot. She was given prednisone 40 x 5 days. She did have relief while on prednisone but feels that pain worsened after completion of treatment. She has also had newer right hip pain for the past week or so. Bending forward, sitting, or movement while laying down causes worsening of pain. Walking eases her pain. She denies associated bowel incontinence, bladder incontinence, or saddle anesthesia. She has had some radiation of pain to her right leg but this has  not been consistent or persistent. She is already taking meloxicam daily for her foot but does not feel that it alleviates any of her back pain. Pt has self DC CAM Rocker on 08/15/23, foot has felt fine, but she plans to visit [employer] [facility] for orthotic footwear.    PAIN:  Are you having pain? Yes, Rt posterior pelvis    PRECAUTIONS: None   WEIGHT BEARING RESTRICTIONS: WBAT in CAM Rocker Boot (Since September); pt has since self DC boot on 08/15/24  FALLS:  Has patient fallen in last 6  months? No  LIVING ENVIRONMENT: Lives with: lives with their family and lives with their daughter Lives in: House/apartment Stairs: Yes: Internal: flight steps; on left going up Has following equipment at home: None  OCCUPATION: Herbalist in a Transport planner office.   PLOF: Independent  PATIENT GOALS: "I want to be able to daily activities without pain."  NEXT MD VISIT: 08/11/2023  OBJECTIVE:  Note: Objective measures were completed at Evaluation unless otherwise noted.  DIAGNOSTIC FINDINGS: MRI completed.   PATIENT SURVEYS:  Modified Oswestry 30/50 severe disability  LEFS 12/80 FOTO 36/60  COGNITION: Overall cognitive status: Within functional limits for tasks assessed     SENSATION: WFL  POSTURE: rounded shoulders and decreased lumbar lordosis  PALPATION: Pain  to L4/L5 and B SI  LOWER EXTREMITY ROM:  WFL RLE pain with SLR.   LOWER EXTREMITY MMT:  L hip 3/5 in all planes, R hip 3-/5 2/2 to pain., B knees 4/5, L ankle 4/5 R ankle deferred 2/2 to injury.  FUNCTIONAL TESTS:  30 seconds chair stand test: 10 reps completed 10 meter walk test: in 9 secs  FABER test Positive, SI compression positive, SLR positive to R, and R Slump test positive   SPECIAL TESTS:  -A/SLR test: pretest = painful, weak RT SLR, not much improvement when paired with pelvic compression, however noted  (08/18/23)                                                                                                                                TREATMENT DATE: 09/01/23  Subjective: Patient reports that she is doing well today and her low back and R hip are not as aggravated as they have been in the past. She is performing her HEP and notices the most benefit from the single knee to chest stretch. No specific questions or concerns currently.   Pain: R low back and R posterior hip pain, minimal upon arrival today;   Manual Therapy  Prone moist heat pack on R low back and R posterior hip  during interval history; Prone STM to R lumbar paraspinals and QL; Grade I-II CPA L3-L5, 20s/bout x 2 bouts/level;   Ther-ex  Prone alternating straight knee hip extension x 10 BLE; Prone R hip ER strengthening with blue tband resistance, anchored by therapist x 10; Child's pose stretch with tail wag and L lateral flexion bias  to stretch R lumbar spine 2 x 30s; Quadruped thoracolumbar rotation with elbow toward knee and ceiling, followed by therapist assisted threading the needle x multiple bouts; Hooklying posterior pelvic tilt x 5, added marches x 10 BLE; Therapist assisted R single knee to chest stretch 2 x 30s; Therapist assisted R hip flexor stretch off edge of table, therapist blocking pelvis 2 x 45s;     PATIENT EDUCATION:  Education details: Pt educated throughout session about proper posture and technique with exercises. Improved exercise technique, movement at target joints, use of target muscles after min to mod verbal, visual, tactile cues.  Person educated: Patient Education method: Explanation Education comprehension: verbalized understanding  HOME EXERCISE PROGRAM: Access Code: [email or web address] URL: [email or web address] Date: 08/09/2023 Prepared by: Maylon Peppers  Exercises - Seated Piriformis Stretch  - 2-3 x daily - 5-7 x weekly - 3-5 reps - 30-60 sec hold - Seated Flexion Stretch  - 2-3 x daily - 5-7 x weekly - 10 reps - 5 sec hold - Supine Lower Trunk Rotation  - 2 x daily - 5-7 x weekly - 1-2 sets - 10 reps - 3-5 sec hold - Supine Single Knee to Chest Stretch  - 2 x daily - 5-7 x weekly - 1-2 sets - 10 reps - 3-5 sec hold - Supine Double Knee to Chest  - 2-3 x daily - 5-7 x weekly - 1-2 sets - 10 reps - 3-5 sec hold  Access Code: [email or web address] URL: [email or web address] Date: 08/23/2023 Prepared by: Maylon Peppers  Exercises - Supine Bridge with Resistance Band  - 2-3 x daily - 5-7 x weekly - 3 sets - 10 reps - Clamshell with Resistance  -  2-3 x daily - 5-7 x weekly - 3 sets - 10 reps - Bird Dog  - 2-3 x daily - 5-7 x weekly - 2 sets - 10 reps  ASSESSMENT:  CLINICAL IMPRESSION:     Patient arrives to treatment with less low back and hip pain than typical. Session focused on low back stabilization training with addition of manual techniques for ROM and tissue extensibility. No pain reported throughout session. Pt encouraged to continue HEP and follow-up as scheduled. She will continue to benefit from skilled therapy to address remaining deficits in order to improve quality of life and return to PLOF.     OBJECTIVE IMPAIRMENTS: Abnormal gait, decreased activity tolerance, difficulty walking, decreased strength, and pain.   ACTIVITY LIMITATIONS: carrying, lifting, bending, sitting, standing, squatting, and transfers  PARTICIPATION LIMITATIONS: cleaning, laundry, driving, community activity, and occupation  PERSONAL FACTORS: Age and Time since onset of injury/illness/exacerbation are also affecting patient's functional outcome.   REHAB POTENTIAL: Good  CLINICAL DECISION MAKING: Stable/uncomplicated  EVALUATION COMPLEXITY: Low   GOALS: Goals reviewed with patient? Yes  SHORT TERM GOALS: Target date: 09/05/2023 Pt will become Independent with HEP to promote functional mobility Baseline: HEP to be furnished next session Goal status: INITIAL   LONG TERM GOALS: Target date: 09/30/2023   Pt will reduce pain with all function to 2/10 to improve QOL Baseline:  Goal status: INITIAL  2.  Pt will complete >15 STS reps in 30 secs to demonstrate improve BLE strength Baseline: 10 Goal status: INITIAL  3.  Pt will imporve LEFS score to >50/80 to demonstrate progress with LE functions and QOL Baseline: 12/80 Goal status: INITIAL  4.  Pt will become SLR/Slump Test negative with pain Baseline: Positve Goal status: INITIAL  5.  Pt will improve ODI score to <15/50 to demonstrate  improve pain and QOL.  Baseline: 30/50 Goal  status: INITIAL   PLAN:  PT FREQUENCY: 1-2x/week  PT DURATION: 8 weeks  PLANNED INTERVENTIONS: 97110-Therapeutic exercises, 97530- Therapeutic activity, [id number]- Neuromuscular re-education, 97535- Self Care, 16109- Manual therapy, and 97014- Electrical stimulation (unattended)  PLAN FOR NEXT SESSION: Continue as per POC.    Lynnea Maizes PT, DPT, GCS  Physical Therapist - [facility] 12:50 PM, 09/01/23

## 2023-09-06 ENCOUNTER — Encounter: Payer: Self-pay | Admitting: Physical Therapy

## 2023-09-06 ENCOUNTER — Ambulatory Visit: Payer: BC Managed Care – PPO

## 2023-09-06 DIAGNOSIS — M6281 Muscle weakness (generalized): Secondary | ICD-10-CM | POA: Diagnosis not present

## 2023-09-06 DIAGNOSIS — M5459 Other low back pain: Secondary | ICD-10-CM

## 2023-09-06 NOTE — Therapy (Signed)
 OUTPATIENT PHYSICAL THERAPY TREATMENT  Patient Name: [name] MRN: [id number] DOB:Jun 03, 1981, 43 y.o., female Today's Date: 09/06/2023  END OF SESSION:  PT End of Session - 09/06/23 1644     Visit Number 7    Number of Visits 16    Date for PT Re-Evaluation 09/30/23    PT Start Time 1644    PT Stop Time 1727    PT Time Calculation (min) 43 min    Activity Tolerance Patient tolerated treatment well;No increased pain    Behavior During Therapy [facility] for tasks assessed/performed             Past Medical History:  Diagnosis Date   Anxiety    Arthritis    right ankle   Bony exostosis 09/2014   right knee   GERD (gastroesophageal reflux disease)    History of migraine    Hypertension    Neuromuscular disorder (HCC)    trigeminal nerve pain   Pain, eye, left 10/10/2014   with tingling - states possible shingles; to see PCP   Painful orthopaedic hardware (HCC) 09/2014   right ankle   Past Surgical History:  Procedure Laterality Date   BONE EXOSTOSIS EXCISION Right 10/16/2014   Procedure: EXCISION OF BONY EXOSTOSIS FROM RIGHT KNEE;  Surgeon: Tarry Kos, MD;  Location: [facility];  Service: Orthopedics;  Laterality: Right;   CHOLECYSTECTOMY  07/12/2006   DILATATION & CURRETTAGE/HYSTEROSCOPY WITH RESECTOCOPE N/A 09/24/2022   Procedure: DILATATION & CURETTAGE/HYSTEROSCOPY WITH RESECTOCOPE;  Surgeon: Maxie Better, MD;  Location: [facility];  Service: Gynecology;  Laterality: N/A;   ENDOMETRIAL ABLATION N/A 09/24/2022   Procedure: ENDOMETRIAL ABLATION;  Surgeon: Maxie Better, MD;  Location: [facility];  Service: Gynecology;  Laterality: N/A;   HARDWARE REMOVAL Right 10/16/2014   Procedure: HARDWARE REMOVAL RIGHT ANKLE;  Surgeon: Tarry Kos, MD;  Location: [facility];  Service: Orthopedics;  Laterality: Right;   KNEE ARTHROSCOPY Right 09/01/2007   ORIF ANKLE FRACTURE BIMALLEOLAR Right 02/28/2007    ORIF TIBIA FRACTURE Right 02/28/2007   rod placement right   TIBIA HARDWARE REMOVAL Right 09/01/2007   There are no active problems to display for this patient.   PCP: Mickel Baas MD  REFERRING PROVIDER: Cyndia Diver PA  REFERRING DIAG:  M54.50 (ICD-10-CM) - Lumbago  M25.551 (ICD-10-CM) - Right hip pain    THERAPY DIAG:  Low Back pain with radiculopathy to R hip, Muscle weakness  Rationale for Evaluation and Treatment: Rehabilitation  ONSET DATE: 06/02/2023  FROM INITIAL EVALUATION: SUBJECTIVE:   PERTINENT HISTORY: Pt is concern of pain to bilateral lower back and right hip pain. Her lower back pain began around six weeks ago without trauma or injury. She was seen by Telehealth with [facility] on 06/27/23 for back pain that had been present for one week. No trauma or injury prior to onset. She has been wearing a short CAM walking boot for foot fracture. Recent MRI shows Stress Reaction and no fracture. Pt advised to continue ot wear the boot. She was given prednisone 40 x 5 days. She did have relief while on prednisone but feels that pain worsened after completion of treatment. She has also had newer right hip pain for the past week or so. Bending forward, sitting, or movement while laying down causes worsening of pain. Walking eases her pain. She denies associated bowel incontinence, bladder incontinence, or saddle anesthesia. She has had some radiation of pain to her right leg but this  has not been consistent or persistent. She is already taking meloxicam daily for her foot but does not feel that it alleviates any of her back pain. Pt has self DC CAM Rocker on 08/15/23, foot has felt fine, but she plans to visit [employer] store for orthotic footwear.    PAIN:  Are you having pain? Yes, Rt posterior pelvis    PRECAUTIONS: None   WEIGHT BEARING RESTRICTIONS: WBAT in CAM Rocker Boot (Since September); pt has since self DC boot on 08/15/24  FALLS:  Has patient fallen in last 6  months? No  LIVING ENVIRONMENT: Lives with: lives with their family and lives with their daughter Lives in: House/apartment Stairs: Yes: Internal: flight steps; on left going up Has following equipment at home: None  OCCUPATION: Herbalist in a Transport planner office.   PLOF: Independent  PATIENT GOALS: "I want to be able to daily activities without pain."  NEXT MD VISIT: 08/11/2023  OBJECTIVE:  Note: Objective measures were completed at Evaluation unless otherwise noted.  DIAGNOSTIC FINDINGS: MRI completed.   PATIENT SURVEYS:  Modified Oswestry 30/50 severe disability  LEFS 12/80 FOTO 36/60  COGNITION: Overall cognitive status: Within functional limits for tasks assessed     SENSATION: WFL  POSTURE: rounded shoulders and decreased lumbar lordosis  PALPATION: Pain  to L4/L5 and B SI  LOWER EXTREMITY ROM:  WFL RLE pain with SLR.   LOWER EXTREMITY MMT:  L hip 3/5 in all planes, R hip 3-/5 2/2 to pain., B knees 4/5, L ankle 4/5 R ankle deferred 2/2 to injury.  FUNCTIONAL TESTS:  30 seconds chair stand test: 10 reps completed 10 meter walk test: in 9 secs  FABER test Positive, SI compression positive, SLR positive to R, and R Slump test positive   SPECIAL TESTS:  -A/SLR test: pretest = painful, weak RT SLR, not much improvement when paired with pelvic compression, however noted  (08/18/23)                                                                                                                                TREATMENT DATE: 09/06/23   Subjective: Patient reports 2/10 pain on arrival. She was sore after last session   Pain: R low back and R posterior hip pain, minimal upon arrival today;   Manual Therapy   Prone STM to R lumbar paraspinals and QL; Grade I-II CPA L3-L5, 20s/bout x 2 bouts/level;   Ther-ex  Nustep level 4 x 8 minutes (seat 13) Prone straight knee hip extension 2 x 10 BLE; Child's pose stretch  2 x 30 seconds  Bird dog 2 x 10   Seated hip abduction with BTB 2 x 10  Seated marching with BTB x 10 each LE      PATIENT EDUCATION:  Education details: Pt educated throughout session about proper posture and technique with exercises. Improved exercise technique, movement at target joints, use of target muscles after min  to mod verbal, visual, tactile cues.  Person educated: Patient Education method: Explanation Education comprehension: verbalized understanding  HOME EXERCISE PROGRAM: Access Code: [email or web address] URL: [email or web address] Date: 08/09/2023 Prepared by: Maylon Peppers  Exercises - Seated Piriformis Stretch  - 2-3 x daily - 5-7 x weekly - 3-5 reps - 30-60 sec hold - Seated Flexion Stretch  - 2-3 x daily - 5-7 x weekly - 10 reps - 5 sec hold - Supine Lower Trunk Rotation  - 2 x daily - 5-7 x weekly - 1-2 sets - 10 reps - 3-5 sec hold - Supine Single Knee to Chest Stretch  - 2 x daily - 5-7 x weekly - 1-2 sets - 10 reps - 3-5 sec hold - Supine Double Knee to Chest  - 2-3 x daily - 5-7 x weekly - 1-2 sets - 10 reps - 3-5 sec hold  Access Code: [email or web address] URL: [email or web address] Date: 08/23/2023 Prepared by: Maylon Peppers  Exercises - Supine Bridge with Resistance Band  - 2-3 x daily - 5-7 x weekly - 3 sets - 10 reps - Clamshell with Resistance  - 2-3 x daily - 5-7 x weekly - 3 sets - 10 reps - Bird Dog  - 2-3 x daily - 5-7 x weekly - 2 sets - 10 reps  ASSESSMENT:  CLINICAL IMPRESSION:      Patient arrives to treatment with slightly more low back and hip pain than typical. Session focused on low back stabilization training along with manual techniques for ROM and tissue extensibility. Tolerated increased resistance this date with blue theraband. She will continue to benefit from skilled therapy to address remaining deficits in order to improve quality of life and return to PLOF.     OBJECTIVE IMPAIRMENTS: Abnormal gait, decreased activity tolerance, difficulty walking,  decreased strength, and pain.   ACTIVITY LIMITATIONS: carrying, lifting, bending, sitting, standing, squatting, and transfers  PARTICIPATION LIMITATIONS: cleaning, laundry, driving, community activity, and occupation  PERSONAL FACTORS: Age and Time since onset of injury/illness/exacerbation are also affecting patient's functional outcome.   REHAB POTENTIAL: Good  CLINICAL DECISION MAKING: Stable/uncomplicated  EVALUATION COMPLEXITY: Low   GOALS: Goals reviewed with patient? Yes  SHORT TERM GOALS: Target date: 09/05/2023 Pt will become Independent with HEP to promote functional mobility Baseline: HEP to be furnished next session Goal status: INITIAL   LONG TERM GOALS: Target date: 09/30/2023   Pt will reduce pain with all function to 2/10 to improve QOL Baseline:  Goal status: INITIAL  2.  Pt will complete >15 STS reps in 30 secs to demonstrate improve BLE strength Baseline: 10 Goal status: INITIAL  3.  Pt will imporve LEFS score to >50/80 to demonstrate progress with LE functions and QOL Baseline: 12/80 Goal status: INITIAL  4.  Pt will become SLR/Slump Test negative with pain Baseline: Positve Goal status: INITIAL  5.  Pt will improve ODI score to <15/50 to demonstrate improve pain and QOL.  Baseline: 30/50 Goal status: INITIAL   PLAN:  PT FREQUENCY: 1-2x/week  PT DURATION: 8 weeks  PLANNED INTERVENTIONS: 97110-Therapeutic exercises, 97530- Therapeutic activity, [id number]- Neuromuscular re-education, 97535- Self Care, 30865- Manual therapy, and 97014- Electrical stimulation (unattended)  PLAN FOR NEXT SESSION: Continue as per POC.    Maylon Peppers, PT, DPT  Physical Therapist - [place]  [facility] 12:50 PM, 09/01/23

## 2023-09-08 ENCOUNTER — Ambulatory Visit: Payer: BC Managed Care – PPO | Admitting: Physical Therapy

## 2023-09-13 ENCOUNTER — Ambulatory Visit: Payer: BC Managed Care – PPO | Attending: Family Medicine | Admitting: Physical Therapy

## 2023-09-13 DIAGNOSIS — R262 Difficulty in walking, not elsewhere classified: Secondary | ICD-10-CM | POA: Diagnosis present

## 2023-09-13 DIAGNOSIS — M5459 Other low back pain: Secondary | ICD-10-CM | POA: Insufficient documentation

## 2023-09-13 DIAGNOSIS — M6281 Muscle weakness (generalized): Secondary | ICD-10-CM | POA: Insufficient documentation

## 2023-09-13 NOTE — Therapy (Unsigned)
 OUTPATIENT PHYSICAL THERAPY TREATMENT  Patient Name: [name] MRN: [id number] DOB:Jul 24, 1980, 43 y.o., female Today's Date: 09/13/2023  END OF SESSION:  PT End of Session - 09/13/23 1650     Visit Number 8    Number of Visits 16    Date for PT Re-Evaluation 09/30/23    PT Start Time 1650    PT Stop Time 1733    PT Time Calculation (min) 43 min    Activity Tolerance Patient tolerated treatment well;No increased pain    Behavior During Therapy [facility] for tasks assessed/performed              Past Medical History:  Diagnosis Date   Anxiety    Arthritis    right ankle   Bony exostosis 09/2014   right knee   GERD (gastroesophageal reflux disease)    History of migraine    Hypertension    Neuromuscular disorder (HCC)    trigeminal nerve pain   Pain, eye, left 10/10/2014   with tingling - states possible shingles; to see PCP   Painful orthopaedic hardware (HCC) 09/2014   right ankle   Past Surgical History:  Procedure Laterality Date   BONE EXOSTOSIS EXCISION Right 10/16/2014   Procedure: EXCISION OF BONY EXOSTOSIS FROM RIGHT KNEE;  Surgeon: Tarry Kos, MD;  Location: [facility];  Service: Orthopedics;  Laterality: Right;   CHOLECYSTECTOMY  07/12/2006   DILATATION & CURRETTAGE/HYSTEROSCOPY WITH RESECTOCOPE N/A 09/24/2022   Procedure: DILATATION & CURETTAGE/HYSTEROSCOPY WITH RESECTOCOPE;  Surgeon: Maxie Better, MD;  Location: [facility];  Service: Gynecology;  Laterality: N/A;   ENDOMETRIAL ABLATION N/A 09/24/2022   Procedure: ENDOMETRIAL ABLATION;  Surgeon: Maxie Better, MD;  Location: [facility] ;  Service: Gynecology;  Laterality: N/A;   HARDWARE REMOVAL Right 10/16/2014   Procedure: HARDWARE REMOVAL RIGHT ANKLE;  Surgeon: Tarry Kos, MD;  Location: [facility];  Service: Orthopedics;  Laterality: Right;   KNEE ARTHROSCOPY Right 09/01/2007   ORIF ANKLE FRACTURE BIMALLEOLAR Right 02/28/2007    ORIF TIBIA FRACTURE Right 02/28/2007   rod placement right   TIBIA HARDWARE REMOVAL Right 09/01/2007   There are no active problems to display for this patient.   PCP: Mickel Baas MD  REFERRING PROVIDER: Cyndia Diver PA  REFERRING DIAG:  M54.50 (ICD-10-CM) - Lumbago  M25.551 (ICD-10-CM) - Right hip pain    THERAPY DIAG:  Low Back pain with radiculopathy to R hip, Muscle weakness  Rationale for Evaluation and Treatment: Rehabilitation  ONSET DATE: 06/02/2023  FROM INITIAL EVALUATION: SUBJECTIVE:   PERTINENT HISTORY: Pt is concern of pain to bilateral lower back and right hip pain. Her lower back pain began around six weeks ago without trauma or injury. She was seen by Telehealth with [facility] on 06/27/23 for back pain that had been present for one week. No trauma or injury prior to onset. She has been wearing a short CAM walking boot for foot fracture. Recent MRI shows Stress Reaction and no fracture. Pt advised to continue ot wear the boot. She was given prednisone 40 x 5 days. She did have relief while on prednisone but feels that pain worsened after completion of treatment. She has also had newer right hip pain for the past week or so. Bending forward, sitting, or movement while laying down causes worsening of pain. Walking eases her pain. She denies associated bowel incontinence, bladder incontinence, or saddle anesthesia. She has had some radiation of pain to her right leg but  this has not been consistent or persistent. She is already taking meloxicam daily for her foot but does not feel that it alleviates any of her back pain. Pt has self DC CAM Rocker on 08/15/23, foot has felt fine, but she plans to visit [employer] store for orthotic footwear.    PAIN:  Are you having pain? Yes, Rt posterior pelvis    PRECAUTIONS: None   WEIGHT BEARING RESTRICTIONS: WBAT in CAM Rocker Boot (Since September); pt has since self DC boot on 08/15/24  FALLS:  Has patient fallen in last 6  months? No  LIVING ENVIRONMENT: Lives with: lives with their family and lives with their daughter Lives in: House/apartment Stairs: Yes: Internal: flight steps; on left going up Has following equipment at home: None  OCCUPATION: Herbalist in a Transport planner office.   PLOF: Independent  PATIENT GOALS: "I want to be able to daily activities without pain."  NEXT MD VISIT: 08/11/2023  OBJECTIVE:  Note: Objective measures were completed at Evaluation unless otherwise noted.  DIAGNOSTIC FINDINGS: MRI completed.   PATIENT SURVEYS:  Modified Oswestry 30/50 severe disability  LEFS 12/80 FOTO 36/60  COGNITION: Overall cognitive status: Within functional limits for tasks assessed     SENSATION: WFL  POSTURE: rounded shoulders and decreased lumbar lordosis  PALPATION: Pain  to L4/L5 and B SI  LOWER EXTREMITY ROM:  WFL RLE pain with SLR.   LOWER EXTREMITY MMT:  L hip 3/5 in all planes, R hip 3-/5 2/2 to pain., B knees 4/5, L ankle 4/5 R ankle deferred 2/2 to injury.  FUNCTIONAL TESTS:  30 seconds chair stand test: 10 reps completed 10 meter walk test: in 9 secs  FABER test Positive, SI compression positive, SLR positive to R, and R Slump test positive   SPECIAL TESTS:  -A/SLR test: pretest = painful, weak RT SLR, not much improvement when paired with pelvic compression, however noted  (08/18/23)                                                                                                                                TREATMENT DATE: 09/14/23    Subjective: Patient reports 4/10 pain on arrival. Patient reports some pain affecting R gluteal region and down to R leg/calf. She tolerated last session well.   Pain: R low back and R posterior hip pain,   Manual Therapy   RLE long-leg distraction, gr II; 10-sec intermittent holds, x 3 minutes Prone STM and IASTM with Hypervolt to R lumbar paraspinals and QL; x 10 minutes Grade I-II CPA L3-L5, 30s/bout x 2  bouts/level;   Ther-ex  Child's pose stretch  2 x 30 seconds  Supine piriformis stretch; 2 x 30 seconds  Dying bug; 1x8 alternating UE/LE; heel tap at bottom of leg extension  Prone straight knee hip extension 2 x 10 BLE;  Seated hip abduction with BTB 2 x 10     *not today* Nustep  level 4 x 8 minutes (seat 13) Seated marching with BTB x 10 each LE  Bird dog 2 x 10    PATIENT EDUCATION:  Education details: Pt educated throughout session about proper posture and technique with exercises. Improved exercise technique, movement at target joints, use of target muscles after min to mod verbal, visual, tactile cues.  Person educated: Patient Education method: Explanation Education comprehension: verbalized understanding  HOME EXERCISE PROGRAM: Access Code: [email or web address] URL: [email or web address] Date: 08/09/2023 Prepared by: Maylon Peppers  Exercises - Seated Piriformis Stretch  - 2-3 x daily - 5-7 x weekly - 3-5 reps - 30-60 sec hold - Seated Flexion Stretch  - 2-3 x daily - 5-7 x weekly - 10 reps - 5 sec hold - Supine Lower Trunk Rotation  - 2 x daily - 5-7 x weekly - 1-2 sets - 10 reps - 3-5 sec hold - Supine Single Knee to Chest Stretch  - 2 x daily - 5-7 x weekly - 1-2 sets - 10 reps - 3-5 sec hold - Supine Double Knee to Chest  - 2-3 x daily - 5-7 x weekly - 1-2 sets - 10 reps - 3-5 sec hold  Access Code: [email or web address] URL: [email or web address] Date: 08/23/2023 Prepared by: Maylon Peppers  Exercises - Supine Bridge with Resistance Band  - 2-3 x daily - 5-7 x weekly - 3 sets - 10 reps - Clamshell with Resistance  - 2-3 x daily - 5-7 x weekly - 3 sets - 10 reps - Bird Dog  - 2-3 x daily - 5-7 x weekly - 2 sets - 10 reps  ASSESSMENT:  CLINICAL IMPRESSION:      Patient feels she has made notable progress to date with more intermittent/sporadic symptoms versus significant symptoms continuously experienced at outset of PT. She reports R iliolumbar and  gluteal pain with referral to R leg. Peripheral symptoms are not reported during use of distraction. Localized symptoms along R lumbar flank/glute remain and are not irritated with continued stabilization work. She will continue to benefit from skilled therapy to address remaining deficits in order to improve quality of life and return to PLOF.     OBJECTIVE IMPAIRMENTS: Abnormal gait, decreased activity tolerance, difficulty walking, decreased strength, and pain.   ACTIVITY LIMITATIONS: carrying, lifting, bending, sitting, standing, squatting, and transfers  PARTICIPATION LIMITATIONS: cleaning, laundry, driving, community activity, and occupation  PERSONAL FACTORS: Age and Time since onset of injury/illness/exacerbation are also affecting patient's functional outcome.   REHAB POTENTIAL: Good  CLINICAL DECISION MAKING: Stable/uncomplicated  EVALUATION COMPLEXITY: Low   GOALS: Goals reviewed with patient? Yes  SHORT TERM GOALS: Target date: 09/05/2023 Pt will become Independent with HEP to promote functional mobility Baseline: HEP to be furnished next session Goal status: INITIAL   LONG TERM GOALS: Target date: 09/30/2023   Pt will reduce pain with all function to 2/10 to improve QOL Baseline:  Goal status: INITIAL  2.  Pt will complete >15 STS reps in 30 secs to demonstrate improve BLE strength Baseline: 10 Goal status: INITIAL  3.  Pt will imporve LEFS score to >50/80 to demonstrate progress with LE functions and QOL Baseline: 12/80 Goal status: INITIAL  4.  Pt will become SLR/Slump Test negative with pain Baseline: Positve Goal status: INITIAL  5.  Pt will improve ODI score to <15/50 to demonstrate improve pain and QOL.  Baseline: 30/50 Goal status: INITIAL   PLAN:  PT FREQUENCY: 1-2x/week  PT DURATION: 8 weeks  PLANNED INTERVENTIONS: 97110-Therapeutic exercises, 97530- Therapeutic activity,  16109- Neuromuscular re-education, 97535- Self Care, 60454- Manual  therapy, and 97014- Electrical stimulation (unattended)  PLAN FOR NEXT SESSION: Continue as per POC.   Consuela Mimes, PT, DPT [phone] Physical Therapist - [facility] 12:50 PM, 09/01/23

## 2023-09-14 ENCOUNTER — Encounter: Payer: Self-pay | Admitting: Physical Therapy

## 2023-09-15 ENCOUNTER — Ambulatory Visit: Payer: BC Managed Care – PPO | Admitting: Physical Therapy

## 2023-09-15 DIAGNOSIS — M6281 Muscle weakness (generalized): Secondary | ICD-10-CM | POA: Diagnosis not present

## 2023-09-15 DIAGNOSIS — R262 Difficulty in walking, not elsewhere classified: Secondary | ICD-10-CM

## 2023-09-15 DIAGNOSIS — M5459 Other low back pain: Secondary | ICD-10-CM

## 2023-09-15 NOTE — Therapy (Signed)
 OUTPATIENT PHYSICAL THERAPY TREATMENT/GOAL UPDATE  Patient Name: [name] MRN: [id number] DOB:Aug 31, 1980, 43 y.o., female Today's Date: 09/15/2023   END OF SESSION:  PT End of Session - 09/15/23 1648     Visit Number 9    Number of Visits 16    Date for PT Re-Evaluation 09/30/23    PT Start Time 1648    PT Stop Time 1729    PT Time Calculation (min) 41 min    Activity Tolerance Patient tolerated treatment well;No increased pain    Behavior During Therapy [facility] for tasks assessed/performed               Past Medical History:  Diagnosis Date   Anxiety    Arthritis    right ankle   Bony exostosis 09/2014   right knee   GERD (gastroesophageal reflux disease)    History of migraine    Hypertension    Neuromuscular disorder (HCC)    trigeminal nerve pain   Pain, eye, left 10/10/2014   with tingling - states possible shingles; to see PCP   Painful orthopaedic hardware (HCC) 09/2014   right ankle   Past Surgical History:  Procedure Laterality Date   BONE EXOSTOSIS EXCISION Right 10/16/2014   Procedure: EXCISION OF BONY EXOSTOSIS FROM RIGHT KNEE;  Surgeon: Tarry Kos, MD;  Location: [facility];  Service: Orthopedics;  Laterality: Right;   CHOLECYSTECTOMY  07/12/2006   DILATATION & CURRETTAGE/HYSTEROSCOPY WITH RESECTOCOPE N/A 09/24/2022   Procedure: DILATATION & CURETTAGE/HYSTEROSCOPY WITH RESECTOCOPE;  Surgeon: Maxie Better, MD;  Location: [facility];  Service: Gynecology;  Laterality: N/A;   ENDOMETRIAL ABLATION N/A 09/24/2022   Procedure: ENDOMETRIAL ABLATION;  Surgeon: Maxie Better, MD;  Location: [facility];  Service: Gynecology;  Laterality: N/A;   HARDWARE REMOVAL Right 10/16/2014   Procedure: HARDWARE REMOVAL RIGHT ANKLE;  Surgeon: Tarry Kos, MD;  Location: [facility];  Service: Orthopedics;  Laterality: Right;   KNEE ARTHROSCOPY Right 09/01/2007   ORIF ANKLE FRACTURE BIMALLEOLAR  Right 02/28/2007   ORIF TIBIA FRACTURE Right 02/28/2007   rod placement right   TIBIA HARDWARE REMOVAL Right 09/01/2007   There are no active problems to display for this patient.   PCP: Mickel Baas MD  REFERRING PROVIDER: Cyndia Diver PA  REFERRING DIAG:  M54.50 (ICD-10-CM) - Lumbago  M25.551 (ICD-10-CM) - Right hip pain    THERAPY DIAG:  Low Back pain with radiculopathy to R hip, Muscle weakness  Rationale for Evaluation and Treatment: Rehabilitation  ONSET DATE: 06/02/2023  FROM INITIAL EVALUATION: SUBJECTIVE:   PERTINENT HISTORY: Pt is concern of pain to bilateral lower back and right hip pain. Her lower back pain began around six weeks ago without trauma or injury. She was seen by Telehealth with [facility] on 06/27/23 for back pain that had been present for one week. No trauma or injury prior to onset. She has been wearing a short CAM walking boot for foot fracture. Recent MRI shows Stress Reaction and no fracture. Pt advised to continue ot wear the boot. She was given prednisone 40 x 5 days. She did have relief while on prednisone but feels that pain worsened after completion of treatment. She has also had newer right hip pain for the past week or so. Bending forward, sitting, or movement while laying down causes worsening of pain. Walking eases her pain. She denies associated bowel incontinence, bladder incontinence, or saddle anesthesia. She has had some radiation of pain to her  right leg but this has not been consistent or persistent. She is already taking meloxicam daily for her foot but does not feel that it alleviates any of her back pain. Pt has self DC CAM Rocker on 08/15/23, foot has felt fine, but she plans to visit [employer] store for orthotic footwear.    PAIN:  Are you having pain? Yes, Rt posterior pelvis    PRECAUTIONS: None   WEIGHT BEARING RESTRICTIONS: WBAT in CAM Rocker Boot (Since September); pt has since self DC boot on 08/15/24  FALLS:  Has patient  fallen in last 6 months? No  LIVING ENVIRONMENT: Lives with: lives with their family and lives with their daughter Lives in: House/apartment Stairs: Yes: Internal: flight steps; on left going up Has following equipment at home: None  OCCUPATION: Herbalist in a Transport planner office.   PLOF: Independent  PATIENT GOALS: "I want to be able to daily activities without pain."  NEXT MD VISIT: 08/11/2023  OBJECTIVE:  Note: Objective measures were completed at Evaluation unless otherwise noted.  DIAGNOSTIC FINDINGS: MRI completed.   PATIENT SURVEYS:  Modified Oswestry 30/50 severe disability  LEFS 12/80 FOTO 36/60  COGNITION: Overall cognitive status: Within functional limits for tasks assessed     SENSATION: WFL  POSTURE: rounded shoulders and decreased lumbar lordosis  PALPATION: Pain  to L4/L5 and B SI  LOWER EXTREMITY ROM:  WFL RLE pain with SLR.   LOWER EXTREMITY MMT:  L hip 3/5 in all planes, R hip 3-/5 2/2 to pain., B knees 4/5, L ankle 4/5 R ankle deferred 2/2 to injury.  FUNCTIONAL TESTS:  30 seconds chair stand test: 10 reps completed 10 meter walk test: in 9 secs  FABER test Positive, SI compression positive, SLR positive to R, and R Slump test positive   SPECIAL TESTS:  -A/SLR test: pretest = painful, weak RT SLR, not much improvement when paired with pelvic compression, however noted  (08/18/23)                                                                                                                                TREATMENT DATE: 09/15/23    Subjective: Patient reports feeling notably sore this afternoon. She feels she is "not there yet" in regard to her current progress. Pt reports telling major difference compared to baseline condition. Patient reports 80% global rating of improvement. Patient reports being most challenged with prolonged standing - this can lead to feeling of spasm. Patient reports difficulty with prolonged walking/standing in  grocery store. Patient reports doing well with her HEP. Pt reports soreness after Tuesday that was tolerable.   Pain: R low back and R posterior hip pain,    *GOAL UPDATE PERFORMED   Manual Therapy   BLE long-leg distraction attempted, gr II; 10-sec intermittent holds, x 1 minute  -no significant pain relief Prone STM and IASTM with Hypervolt to R lumbar paraspinals and QL; x 8 minutes  Grade I-II CPA L3-L5, 30s/bout x 2 bouts/level;   Ther-ex  Supine piriformis stretch; 2 x 30 seconds  Dying bug; 1x8 alternating UE/LE; heel tap at bottom of leg extension  *next visit* Prone straight knee hip extension 2 x 10 BLE; Seated hip abduction with BTB 2 x 10    PATIENT EDUCATION: Updated home exercise program and encouraged continued HEP and plan of care through March.  *not today* Child's pose stretch  2 x 30 seconds  Nustep level 4 x 8 minutes (seat 13) Seated marching with BTB x 10 each LE  Bird dog 2 x 10    PATIENT EDUCATION:  Education details: Pt educated throughout session about proper posture and technique with exercises. Improved exercise technique, movement at target joints, use of target muscles after min to mod verbal, visual, tactile cues.  Person educated: Patient Education method: Explanation Education comprehension: verbalized understanding  HOME EXERCISE PROGRAM: Access Code: [email or web address] URL: [email or web address] Date: 08/09/2023 Prepared by: Maylon Peppers  Exercises - Seated Piriformis Stretch  - 2-3 x daily - 5-7 x weekly - 3-5 reps - 30-60 sec hold - Seated Flexion Stretch  - 2-3 x daily - 5-7 x weekly - 10 reps - 5 sec hold - Supine Lower Trunk Rotation  - 2 x daily - 5-7 x weekly - 1-2 sets - 10 reps - 3-5 sec hold - Supine Single Knee to Chest Stretch  - 2 x daily - 5-7 x weekly - 1-2 sets - 10 reps - 3-5 sec hold - Supine Double Knee to Chest  - 2-3 x daily - 5-7 x weekly - 1-2 sets - 10 reps - 3-5 sec hold  Access Code: [email or web address] URL:  [email or web address] Date: 08/23/2023 Prepared by: Maylon Peppers  Exercises - Supine Bridge with Resistance Band  - 2-3 x daily - 5-7 x weekly - 3 sets - 10 reps - Clamshell with Resistance  - 2-3 x daily - 5-7 x weekly - 3 sets - 10 reps - Bird Dog  - 2-3 x daily - 5-7 x weekly - 2 sets - 10 reps  ASSESSMENT:  CLINICAL IMPRESSION:      Patient feels she has made notable progress to date with more intermittent/sporadic symptoms versus significant symptoms continuously experienced at outset of PT. She reports R iliolumbar and gluteal pain with referral to R leg. Peripheral symptoms are not reported during use of distraction. Localized symptoms along R lumbar flank/glute remain and are not irritated with continued stabilization work. She will continue to benefit from skilled therapy to address remaining deficits in order to improve quality of life and return to PLOF.     OBJECTIVE IMPAIRMENTS: Abnormal gait, decreased activity tolerance, difficulty walking, decreased strength, and pain.   ACTIVITY LIMITATIONS: carrying, lifting, bending, sitting, standing, squatting, and transfers  PARTICIPATION LIMITATIONS: cleaning, laundry, driving, community activity, and occupation  PERSONAL FACTORS: Age and Time since onset of injury/illness/exacerbation are also affecting patient's functional outcome.   REHAB POTENTIAL: Good  CLINICAL DECISION MAKING: Stable/uncomplicated  EVALUATION COMPLEXITY: Low   GOALS: Goals reviewed with patient? Yes  SHORT TERM GOALS: Target date: 09/05/2023 Pt will become Independent with HEP to promote functional mobility Baseline: HEP to be furnished next session.    09/15/23: Patient is compliant with HEP/recounts exercises well.  Goal status: ACHIEVED   LONG TERM GOALS: Target date: 09/30/2023   Pt will reduce pain with all function to 2/10 to improve QOL    09/15/23: 8-9/10 at worst with cooking and cleaning activities  in home.  Goal status: NOT  MET   2.  Pt will complete >15 STS reps in 30 secs to demonstrate improve BLE strength Baseline: 10        09/15/23: 8 Goal status: NOT MET   3.  Pt will imporve LEFS score to >50/80 to demonstrate progress with LE functions and QOL Baseline: 12/80       09/15/23: 38/80 Goal status: IN PROGRESS  4.  Pt will become SLR/Slump Test negative with pain Baseline: Positve       09/15/23: Positive for SLR/SLUMP/Negative cross SLR Goal status: NOT MET   5.  Pt will improve ODI score to <15/50 to demonstrate improve pain and QOL.  Baseline: 30/50       09/15/23: 19/50 Goal status: IN PROGRESS   PLAN:  PT FREQUENCY: 1-2x/week  PT DURATION: 8 weeks  PLANNED INTERVENTIONS: 97110-Therapeutic exercises, 97530- Therapeutic activity, 97112- Neuromuscular re-education, 97535- Self Care, 16109- Manual therapy, and 97014- Electrical stimulation (unattended)  PLAN FOR NEXT SESSION: Continue as per POC.   THIS NOTE IS INCOMPLETE, PLEASE DO NOT REFERENCE FOR INFORMATION   Consuela Mimes, PT, DPT [phone] Physical Therapist - [facility] 12:50 PM, 09/01/23

## 2023-09-19 ENCOUNTER — Ambulatory Visit: Payer: Self-pay | Admitting: Physical Therapy

## 2023-09-19 ENCOUNTER — Encounter: Payer: Self-pay | Admitting: Physical Therapy

## 2023-09-19 NOTE — Addendum Note (Signed)
 Addended by: Gertie Exon on: 09/19/2023 06:18 AM   Modules accepted: Orders

## 2023-09-28 ENCOUNTER — Ambulatory Visit: Admitting: Physical Therapy

## 2023-09-28 NOTE — Therapy (Deleted)
 OUTPATIENT PHYSICAL THERAPY TREATMENT  Patient Name: [name] MRN: [id number] DOB:06-13-1981, 43 y.o., female Today's Date: 09/28/2023   END OF SESSION:      Past Medical History:  Diagnosis Date   Anxiety    Arthritis    right ankle   Bony exostosis 09/2014   right knee   GERD (gastroesophageal reflux disease)    History of migraine    Hypertension    Neuromuscular disorder (HCC)    trigeminal nerve pain   Pain, eye, left 10/10/2014   with tingling - states possible shingles; to see PCP   Painful orthopaedic hardware (HCC) 09/2014   right ankle   Past Surgical History:  Procedure Laterality Date   BONE EXOSTOSIS EXCISION Right 10/16/2014   Procedure: EXCISION OF BONY EXOSTOSIS FROM RIGHT KNEE;  Surgeon: Tarry Kos, MD;  Location: [facility];  Service: Orthopedics;  Laterality: Right;   CHOLECYSTECTOMY  07/12/2006   DILATATION & CURRETTAGE/HYSTEROSCOPY WITH RESECTOCOPE N/A 09/24/2022   Procedure: DILATATION & CURETTAGE/HYSTEROSCOPY WITH RESECTOCOPE;  Surgeon: Maxie Better, MD;  Location: [facility] [place];  Service: Gynecology;  Laterality: N/A;   ENDOMETRIAL ABLATION N/A 09/24/2022   Procedure: ENDOMETRIAL ABLATION;  Surgeon: Maxie Better, MD;  Location: [facility] [place];  Service: Gynecology;  Laterality: N/A;   HARDWARE REMOVAL Right 10/16/2014   Procedure: HARDWARE REMOVAL RIGHT ANKLE;  Surgeon: Tarry Kos, MD;  Location: [facility];  Service: Orthopedics;  Laterality: Right;   KNEE ARTHROSCOPY Right 09/01/2007   ORIF ANKLE FRACTURE BIMALLEOLAR Right 02/28/2007   ORIF TIBIA FRACTURE Right 02/28/2007   rod placement right   TIBIA HARDWARE REMOVAL Right 09/01/2007   There are no active problems to display for this patient.   PCP: Mickel Baas MD  REFERRING PROVIDER: Cyndia Diver PA  REFERRING DIAG:  M54.50 (ICD-10-CM) - Lumbago  M25.551 (ICD-10-CM) - Right hip pain    THERAPY  DIAG:  Low Back pain with radiculopathy to R hip, Muscle weakness  Rationale for Evaluation and Treatment: Rehabilitation  ONSET DATE: 06/02/2023  FROM INITIAL EVALUATION: SUBJECTIVE:   PERTINENT HISTORY: Pt is concern of pain to bilateral lower back and right hip pain. Her lower back pain began around six weeks ago without trauma or injury. She was seen by Telehealth with [facility] on 06/27/23 for back pain that had been present for one week. No trauma or injury prior to onset. She has been wearing a short CAM walking boot for foot fracture. Recent MRI shows Stress Reaction and no fracture. Pt advised to continue ot wear the boot. She was given prednisone 40 x 5 days. She did have relief while on prednisone but feels that pain worsened after completion of treatment. She has also had newer right hip pain for the past week or so. Bending forward, sitting, or movement while laying down causes worsening of pain. Walking eases her pain. She denies associated bowel incontinence, bladder incontinence, or saddle anesthesia. She has had some radiation of pain to her right leg but this has not been consistent or persistent. She is already taking meloxicam daily for her foot but does not feel that it alleviates any of her back pain. Pt has self DC CAM Rocker on 08/15/23, foot has felt fine, but she plans to visit [employer] store for orthotic footwear.    PAIN:  Are you having pain? Yes, Rt posterior pelvis    PRECAUTIONS: None   WEIGHT BEARING RESTRICTIONS: WBAT in CAM Rocker Boot (Since September);  pt has since self DC boot on 08/15/24  FALLS:  Has patient fallen in last 6 months? No  LIVING ENVIRONMENT: Lives with: lives with their family and lives with their daughter Lives in: House/apartment Stairs: Yes: Internal: flight steps; on left going up Has following equipment at home: None  OCCUPATION: Herbalist in a Transport planner office.   PLOF: Independent  PATIENT GOALS: "I want to be able  to daily activities without pain."  NEXT MD VISIT: 08/11/2023  OBJECTIVE:  Note: Objective measures were completed at Evaluation unless otherwise noted.  DIAGNOSTIC FINDINGS: MRI completed.   PATIENT SURVEYS:  Modified Oswestry 30/50 severe disability  LEFS 12/80 FOTO 36/60  COGNITION: Overall cognitive status: Within functional limits for tasks assessed     SENSATION: WFL  POSTURE: rounded shoulders and decreased lumbar lordosis  PALPATION: Pain  to L4/L5 and B SI  LOWER EXTREMITY ROM:  WFL RLE pain with SLR.   LOWER EXTREMITY MMT:  L hip 3/5 in all planes, R hip 3-/5 2/2 to pain., B knees 4/5, L ankle 4/5 R ankle deferred 2/2 to injury.  FUNCTIONAL TESTS:  30 seconds chair stand test: 10 reps completed 10 meter walk test: in 9 secs  FABER test Positive, SI compression positive, SLR positive to R, and R Slump test positive   SPECIAL TESTS:  -A/SLR test: pretest = painful, weak RT SLR, not much improvement when paired with pelvic compression, however noted  (08/18/23)                                                                                                                                TREATMENT DATE: 09/28/23    Subjective: Patient reports feeling notably sore this afternoon. She feels she is "not there yet" in regard to her current progress. Pt reports that she can tell major difference compared to baseline condition. Patient reports 80% global rating of improvement. Patient reports being most challenged with prolonged standing - this can lead to feeling of spasm. Patient reports difficulty with prolonged walking/standing in grocery store. Patient reports doing well with her HEP. Pt reports soreness after Tuesday that was tolerable. Pt has experienced recent 8-9/10 pain with cooking/cleaning in home.   Pain: R low back and R posterior hip pain,   Ther-ex   Nustep level 4 x 8 minutes (seat 13)  Supine piriformis stretch; 2 x 30 seconds  Dying bug; 1x8  alternating UE/LE; heel tap at bottom of leg extension  Prone straight knee hip extension 2 x 10 BLE;    PATIENT EDUCATION: Updated home exercise program and encouraged continued HEP and plan of care through March.  *not today* Seated hip abduction with BTB 2 x 10  Child's pose stretch  2 x 30 seconds   Seated marching with BTB x 10 each LE  Bird dog 2 x 10     Manual Therapy   Prone STM and IASTM with Hypervolt to R  lumbar paraspinals and QL; x 8 minutes Grade I-II CPA L3-L5, 30s/bout x 2 bouts/level;   PATIENT EDUCATION:  Education details: Pt educated throughout session about proper posture and technique with exercises. Improved exercise technique, movement at target joints, use of target muscles after min to mod verbal, visual, tactile cues.  Person educated: Patient Education method: Explanation Education comprehension: verbalized understanding  HOME EXERCISE PROGRAM: Access Code: [email or web address] URL: [email or web address] Date: 08/09/2023 Prepared by: Maylon Peppers  Exercises - Seated Piriformis Stretch  - 2-3 x daily - 5-7 x weekly - 3-5 reps - 30-60 sec hold - Seated Flexion Stretch  - 2-3 x daily - 5-7 x weekly - 10 reps - 5 sec hold - Supine Lower Trunk Rotation  - 2 x daily - 5-7 x weekly - 1-2 sets - 10 reps - 3-5 sec hold - Supine Single Knee to Chest Stretch  - 2 x daily - 5-7 x weekly - 1-2 sets - 10 reps - 3-5 sec hold - Supine Double Knee to Chest  - 2-3 x daily - 5-7 x weekly - 1-2 sets - 10 reps - 3-5 sec hold  Access Code: [email or web address] URL: [email or web address] Date: 09/15/2023 Prepared by: Consuela Mimes  Exercises - Supine Bridge with Resistance Band  - 2-3 x daily - 5-7 x weekly - 3 sets - 10 reps - Clamshell with Resistance  - 2-3 x daily - 5-7 x weekly - 3 sets - 10 reps - Bird Dog  - 2-3 x daily - 5-7 x weekly - 2 sets - 10 reps - Supine Dead Bug with Leg Extension  - 2 x daily - 5-7 x weekly - 2 sets - 10  reps  ASSESSMENT:  CLINICAL IMPRESSION:      Patient has made good progress to date per self-report and with frequency of high-intensity episodes of pain. She does still experience intermittent re-occurrence of symptoms at 8-9/10 and has not met 30-sec sit to stand goal. Pt has made clinically progress with LEFS and Oswestry, but she has not yet met long-term goals for these outcome measures. Symptoms are still provoked with SLR (not cross SLR) and SLUMP testing. Pt has been excellent participant with PT and will need continued work on regular HEP to improve resiliency of lumbar spine and positional tolerance of prolonged upright/standing/walking activity. She will continue to benefit from skilled therapy to address remaining deficits in order to improve quality of life and return to PLOF.     OBJECTIVE IMPAIRMENTS: Abnormal gait, decreased activity tolerance, difficulty walking, decreased strength, and pain.   ACTIVITY LIMITATIONS: carrying, lifting, bending, sitting, standing, squatting, and transfers  PARTICIPATION LIMITATIONS: cleaning, laundry, driving, community activity, and occupation  PERSONAL FACTORS: Age and Time since onset of injury/illness/exacerbation are also affecting patient's functional outcome.   REHAB POTENTIAL: Good  CLINICAL DECISION MAKING: Stable/uncomplicated  EVALUATION COMPLEXITY: Low   GOALS: Goals reviewed with patient? Yes  SHORT TERM GOALS: Target date: 09/05/2023 Pt will become Independent with HEP to promote functional mobility Baseline: HEP to be furnished next session.    09/15/23: Patient is compliant with HEP/recounts exercises well.  Goal status: ACHIEVED   LONG TERM GOALS: Target date: 09/30/2023   Pt will reduce pain with all function to 2/10 to improve QOL    09/15/23: 8-9/10 at worst with cooking and cleaning activities in home.  Goal status: NOT MET   2.  Pt will complete >15 STS reps in 30 secs to demonstrate improve BLE strength Baseline:  10  09/15/23: 8 Goal status: NOT MET   3.  Pt will imporve LEFS score to >50/80 to demonstrate progress with LE functions and QOL Baseline: 12/80       09/15/23: 38/80 Goal status: IN PROGRESS  4.  Pt will become SLR/Slump Test negative with pain Baseline: Positve       09/15/23: Positive for SLR/SLUMP/Negative cross SLR Goal status: NOT MET   5.  Pt will improve ODI score to <15/50 to demonstrate improve pain and QOL.  Baseline: 30/50       09/15/23: 19/50 Goal status: IN PROGRESS   PLAN:  PT FREQUENCY: 1-2x/week  PT DURATION: 4 weeks  PLANNED INTERVENTIONS: 97110-Therapeutic exercises, 97530- Therapeutic activity, 97112- Neuromuscular re-education, 97535- Self Care, 16109- Manual therapy, and 97014- Electrical stimulation (unattended)  PLAN FOR NEXT SESSION: Continue as per POC. Flexion-bias exercise and progressive trunk stabilization, hip strengthening. Manual therapy and consideration of DN prn for symptom modulation.    Consuela Mimes, PT, DPT [phone] Physical Therapist - [facility] 12:50 PM, 09/01/23

## 2023-10-10 ENCOUNTER — Ambulatory Visit: Admitting: Physical Therapy

## 2023-10-10 DIAGNOSIS — R262 Difficulty in walking, not elsewhere classified: Secondary | ICD-10-CM

## 2023-10-10 DIAGNOSIS — M5459 Other low back pain: Secondary | ICD-10-CM

## 2023-10-10 DIAGNOSIS — M6281 Muscle weakness (generalized): Secondary | ICD-10-CM

## 2023-10-10 NOTE — Therapy (Deleted)
 OUTPATIENT PHYSICAL THERAPY TREATMENT  Patient Name: [name] MRN: [id number] DOB:1980/10/12, 43 y.o., female Today's Date: 10/10/2023   END OF SESSION:      Past Medical History:  Diagnosis Date   Anxiety    Arthritis    right ankle   Bony exostosis 09/2014   right knee   GERD (gastroesophageal reflux disease)    History of migraine    Hypertension    Neuromuscular disorder (HCC)    trigeminal nerve pain   Pain, eye, left 10/10/2014   with tingling - states possible shingles; to see PCP   Painful orthopaedic hardware (HCC) 09/2014   right ankle   Past Surgical History:  Procedure Laterality Date   BONE EXOSTOSIS EXCISION Right 10/16/2014   Procedure: EXCISION OF BONY EXOSTOSIS FROM RIGHT KNEE;  Surgeon: Tarry Kos, MD;  Location: [facility];  Service: Orthopedics;  Laterality: Right;   CHOLECYSTECTOMY  07/12/2006   DILATATION & CURRETTAGE/HYSTEROSCOPY WITH RESECTOCOPE N/A 09/24/2022   Procedure: DILATATION & CURETTAGE/HYSTEROSCOPY WITH RESECTOCOPE;  Surgeon: Maxie Better, MD;  Location: [facility];  Service: Gynecology;  Laterality: N/A;   ENDOMETRIAL ABLATION N/A 09/24/2022   Procedure: ENDOMETRIAL ABLATION;  Surgeon: Maxie Better, MD;  Location: [facility] [place];  Service: Gynecology;  Laterality: N/A;   HARDWARE REMOVAL Right 10/16/2014   Procedure: HARDWARE REMOVAL RIGHT ANKLE;  Surgeon: Tarry Kos, MD;  Location: [facility];  Service: Orthopedics;  Laterality: Right;   KNEE ARTHROSCOPY Right 09/01/2007   ORIF ANKLE FRACTURE BIMALLEOLAR Right 02/28/2007   ORIF TIBIA FRACTURE Right 02/28/2007   rod placement right   TIBIA HARDWARE REMOVAL Right 09/01/2007   There are no active problems to display for this patient.   PCP: Mickel Baas MD  REFERRING PROVIDER: Cyndia Diver PA  REFERRING DIAG:  M54.50 (ICD-10-CM) - Lumbago  M25.551 (ICD-10-CM) - Right hip pain    THERAPY  DIAG:  Low Back pain with radiculopathy to R hip, Muscle weakness  Rationale for Evaluation and Treatment: Rehabilitation  ONSET DATE: 06/02/2023  FROM INITIAL EVALUATION: SUBJECTIVE:   PERTINENT HISTORY: Pt is concern of pain to bilateral lower back and right hip pain. Her lower back pain began around six weeks ago without trauma or injury. She was seen by Telehealth with [facility] on 06/27/23 for back pain that had been present for one week. No trauma or injury prior to onset. She has been wearing a short CAM walking boot for foot fracture. Recent MRI shows Stress Reaction and no fracture. Pt advised to continue ot wear the boot. She was given prednisone 40 x 5 days. She did have relief while on prednisone but feels that pain worsened after completion of treatment. She has also had newer right hip pain for the past week or so. Bending forward, sitting, or movement while laying down causes worsening of pain. Walking eases her pain. She denies associated bowel incontinence, bladder incontinence, or saddle anesthesia. She has had some radiation of pain to her right leg but this has not been consistent or persistent. She is already taking meloxicam daily for her foot but does not feel that it alleviates any of her back pain. Pt has self DC CAM Rocker on 08/15/23, foot has felt fine, but she plans to visit [employer] store for orthotic footwear.    PAIN:  Are you having pain? Yes, Rt posterior pelvis    PRECAUTIONS: None   WEIGHT BEARING RESTRICTIONS: WBAT in CAM Rocker Boot (Since September);  pt has since self DC boot on 08/15/24  FALLS:  Has patient fallen in last 6 months? No  LIVING ENVIRONMENT: Lives with: lives with their family and lives with their daughter Lives in: House/apartment Stairs: Yes: Internal: flight steps; on left going up Has following equipment at home: None  OCCUPATION: Herbalist in a Transport planner office.   PLOF: Independent  PATIENT GOALS: "I want to be able  to daily activities without pain."  NEXT MD VISIT: 08/11/2023  OBJECTIVE:  Note: Objective measures were completed at Evaluation unless otherwise noted.  DIAGNOSTIC FINDINGS: MRI completed.   PATIENT SURVEYS:  Modified Oswestry 30/50 severe disability  LEFS 12/80 FOTO 36/60  COGNITION: Overall cognitive status: Within functional limits for tasks assessed     SENSATION: WFL  POSTURE: rounded shoulders and decreased lumbar lordosis  PALPATION: Pain  to L4/L5 and B SI  LOWER EXTREMITY ROM:  WFL RLE pain with SLR.   LOWER EXTREMITY MMT:  L hip 3/5 in all planes, R hip 3-/5 2/2 to pain., B knees 4/5, L ankle 4/5 R ankle deferred 2/2 to injury.  FUNCTIONAL TESTS:  30 seconds chair stand test: 10 reps completed 10 meter walk test: in 9 secs  FABER test Positive, SI compression positive, SLR positive to R, and R Slump test positive   SPECIAL TESTS:  -A/SLR test: pretest = painful, weak RT SLR, not much improvement when paired with pelvic compression, however noted  (08/18/23)                                                                                                                                TREATMENT DATE: 10/10/23    Subjective: Patient reports feeling notably sore this afternoon. She feels she is "not there yet" in regard to her current progress. Pt reports that she can tell major difference compared to baseline condition. Patient reports 80% global rating of improvement. Patient reports being most challenged with prolonged standing - this can lead to feeling of spasm. Patient reports difficulty with prolonged walking/standing in grocery store. Patient reports doing well with her HEP. Pt reports soreness after Tuesday that was tolerable. Pt has experienced recent 8-9/10 pain with cooking/cleaning in home.   Pain: R low back and R posterior hip pain,   Ther-ex   Nustep level 4 x 8 minutes (seat 13)  Supine piriformis stretch; 2 x 30 seconds  Dying bug; 1x8  alternating UE/LE; heel tap at bottom of leg extension  Prone straight knee hip extension 2 x 10 BLE;    PATIENT EDUCATION: Updated home exercise program and encouraged continued HEP and plan of care through March.  *not today* Seated hip abduction with BTB 2 x 10  Child's pose stretch  2 x 30 seconds   Seated marching with BTB x 10 each LE  Bird dog 2 x 10     Manual Therapy   Prone STM and IASTM with Hypervolt to R  lumbar paraspinals and QL; x 8 minutes Grade I-II CPA L3-L5, 30s/bout x 2 bouts/level;   PATIENT EDUCATION:  Education details: Pt educated throughout session about proper posture and technique with exercises. Improved exercise technique, movement at target joints, use of target muscles after min to mod verbal, visual, tactile cues.  Person educated: Patient Education method: Explanation Education comprehension: verbalized understanding  HOME EXERCISE PROGRAM: Access Code: [email or web address] URL: [email or web address] Date: 08/09/2023 Prepared by: Maylon Peppers  Exercises - Seated Piriformis Stretch  - 2-3 x daily - 5-7 x weekly - 3-5 reps - 30-60 sec hold - Seated Flexion Stretch  - 2-3 x daily - 5-7 x weekly - 10 reps - 5 sec hold - Supine Lower Trunk Rotation  - 2 x daily - 5-7 x weekly - 1-2 sets - 10 reps - 3-5 sec hold - Supine Single Knee to Chest Stretch  - 2 x daily - 5-7 x weekly - 1-2 sets - 10 reps - 3-5 sec hold - Supine Double Knee to Chest  - 2-3 x daily - 5-7 x weekly - 1-2 sets - 10 reps - 3-5 sec hold  Access Code: [email or web address] URL: [email or web address] Date: 09/15/2023 Prepared by: Consuela Mimes  Exercises - Supine Bridge with Resistance Band  - 2-3 x daily - 5-7 x weekly - 3 sets - 10 reps - Clamshell with Resistance  - 2-3 x daily - 5-7 x weekly - 3 sets - 10 reps - Bird Dog  - 2-3 x daily - 5-7 x weekly - 2 sets - 10 reps - Supine Dead Bug with Leg Extension  - 2 x daily - 5-7 x weekly - 2 sets - 10  reps  ASSESSMENT:  CLINICAL IMPRESSION:      Patient has made good progress to date per self-report and with frequency of high-intensity episodes of pain. She does still experience intermittent re-occurrence of symptoms at 8-9/10 and has not met 30-sec sit to stand goal. Pt has made clinically progress with LEFS and Oswestry, but she has not yet met long-term goals for these outcome measures. Symptoms are still provoked with SLR (not cross SLR) and SLUMP testing. Pt has been excellent participant with PT and will need continued work on regular HEP to improve resiliency of lumbar spine and positional tolerance of prolonged upright/standing/walking activity. She will continue to benefit from skilled therapy to address remaining deficits in order to improve quality of life and return to PLOF.     OBJECTIVE IMPAIRMENTS: Abnormal gait, decreased activity tolerance, difficulty walking, decreased strength, and pain.   ACTIVITY LIMITATIONS: carrying, lifting, bending, sitting, standing, squatting, and transfers  PARTICIPATION LIMITATIONS: cleaning, laundry, driving, community activity, and occupation  PERSONAL FACTORS: Age and Time since onset of injury/illness/exacerbation are also affecting patient's functional outcome.   REHAB POTENTIAL: Good  CLINICAL DECISION MAKING: Stable/uncomplicated  EVALUATION COMPLEXITY: Low   GOALS: Goals reviewed with patient? Yes  SHORT TERM GOALS: Target date: 09/05/2023 Pt will become Independent with HEP to promote functional mobility Baseline: HEP to be furnished next session.    09/15/23: Patient is compliant with HEP/recounts exercises well.  Goal status: ACHIEVED   LONG TERM GOALS: Target date: 09/30/2023   Pt will reduce pain with all function to 2/10 to improve QOL    09/15/23: 8-9/10 at worst with cooking and cleaning activities in home.  Goal status: NOT MET   2.  Pt will complete >15 STS reps in 30 secs to demonstrate improve BLE strength Baseline:  10  09/15/23: 8 Goal status: NOT MET   3.  Pt will imporve LEFS score to >50/80 to demonstrate progress with LE functions and QOL Baseline: 12/80       09/15/23: 38/80 Goal status: IN PROGRESS  4.  Pt will become SLR/Slump Test negative with pain Baseline: Positve       09/15/23: Positive for SLR/SLUMP/Negative cross SLR Goal status: NOT MET   5.  Pt will improve ODI score to <15/50 to demonstrate improve pain and QOL.  Baseline: 30/50       09/15/23: 19/50 Goal status: IN PROGRESS   PLAN:  PT FREQUENCY: 1-2x/week  PT DURATION: 4 weeks  PLANNED INTERVENTIONS: 97110-Therapeutic exercises, 97530- Therapeutic activity, 97112- Neuromuscular re-education, 97535- Self Care, 16109- Manual therapy, and 97014- Electrical stimulation (unattended)  PLAN FOR NEXT SESSION: Continue as per POC. Flexion-bias exercise and progressive trunk stabilization, hip strengthening. Manual therapy and consideration of DN prn for symptom modulation.    Consuela Mimes, PT, DPT [phone] Physical Therapist - [facility] 12:50 PM, 09/01/23

## 2023-10-12 ENCOUNTER — Ambulatory Visit: Admitting: Physical Therapy

## 2023-10-17 ENCOUNTER — Ambulatory Visit: Attending: Family Medicine | Admitting: Physical Therapy

## 2023-10-17 DIAGNOSIS — M5459 Other low back pain: Secondary | ICD-10-CM | POA: Diagnosis present

## 2023-10-17 DIAGNOSIS — R262 Difficulty in walking, not elsewhere classified: Secondary | ICD-10-CM | POA: Diagnosis present

## 2023-10-17 DIAGNOSIS — M6281 Muscle weakness (generalized): Secondary | ICD-10-CM | POA: Insufficient documentation

## 2023-10-17 NOTE — Therapy (Unsigned)
 OUTPATIENT PHYSICAL THERAPY TREATMENT  Patient Name: [name] MRN: [id number] DOB:1981-03-25, 43 y.o., female Today's Date: 10/17/2023   END OF SESSION:  PT End of Session - 10/17/23 1640     Visit Number 10    Number of Visits 16    Date for PT Re-Evaluation 09/30/23    PT Start Time 1641    PT Stop Time 1723    PT Time Calculation (min) 42 min    Activity Tolerance Patient tolerated treatment well;No increased pain    Behavior During Therapy [facility] for tasks assessed/performed                Past Medical History:  Diagnosis Date   Anxiety    Arthritis    right ankle   Bony exostosis 09/2014   right knee   GERD (gastroesophageal reflux disease)    History of migraine    Hypertension    Neuromuscular disorder (HCC)    trigeminal nerve pain   Pain, eye, left 10/10/2014   with tingling - states possible shingles; to see PCP   Painful orthopaedic hardware (HCC) 09/2014   right ankle   Past Surgical History:  Procedure Laterality Date   BONE EXOSTOSIS EXCISION Right 10/16/2014   Procedure: EXCISION OF BONY EXOSTOSIS FROM RIGHT KNEE;  Surgeon: Tarry Kos, MD;  Location: [facility];  Service: Orthopedics;  Laterality: Right;   CHOLECYSTECTOMY  07/12/2006   DILATATION & CURRETTAGE/HYSTEROSCOPY WITH RESECTOCOPE N/A 09/24/2022   Procedure: DILATATION & CURETTAGE/HYSTEROSCOPY WITH RESECTOCOPE;  Surgeon: Maxie Better, MD;  Location: [facility];  Service: Gynecology;  Laterality: N/A;   ENDOMETRIAL ABLATION N/A 09/24/2022   Procedure: ENDOMETRIAL ABLATION;  Surgeon: Maxie Better, MD;  Location: [facility];  Service: Gynecology;  Laterality: N/A;   HARDWARE REMOVAL Right 10/16/2014   Procedure: HARDWARE REMOVAL RIGHT ANKLE;  Surgeon: Tarry Kos, MD;  Location: [facility];  Service: Orthopedics;  Laterality: Right;   KNEE ARTHROSCOPY Right 09/01/2007   ORIF ANKLE FRACTURE BIMALLEOLAR Right  02/28/2007   ORIF TIBIA FRACTURE Right 02/28/2007   rod placement right   TIBIA HARDWARE REMOVAL Right 09/01/2007   There are no active problems to display for this patient.   PCP: Mickel Baas MD  REFERRING PROVIDER: Cyndia Diver PA  REFERRING DIAG:  M54.50 (ICD-10-CM) - Lumbago  M25.551 (ICD-10-CM) - Right hip pain    THERAPY DIAG:  Low Back pain with radiculopathy to R hip, Muscle weakness  Rationale for Evaluation and Treatment: Rehabilitation  ONSET DATE: 06/02/2023  FROM INITIAL EVALUATION: SUBJECTIVE:   PERTINENT HISTORY: Pt is concern of pain to bilateral lower back and right hip pain. Her lower back pain began around six weeks ago without trauma or injury. She was seen by Telehealth with [facility] on 06/27/23 for back pain that had been present for one week. No trauma or injury prior to onset. She has been wearing a short CAM walking boot for foot fracture. Recent MRI shows Stress Reaction and no fracture. Pt advised to continue ot wear the boot. She was given prednisone 40 x 5 days. She did have relief while on prednisone but feels that pain worsened after completion of treatment. She has also had newer right hip pain for the past week or so. Bending forward, sitting, or movement while laying down causes worsening of pain. Walking eases her pain. She denies associated bowel incontinence, bladder incontinence, or saddle anesthesia. She has had some radiation of pain to her  right leg but this has not been consistent or persistent. She is already taking meloxicam daily for her foot but does not feel that it alleviates any of her back pain. Pt has self DC CAM Rocker on 08/15/23, foot has felt fine, but she plans to visit [employer] [facility] for orthotic footwear.    PAIN:  Are you having pain? Yes, Rt posterior pelvis    PRECAUTIONS: None   WEIGHT BEARING RESTRICTIONS: WBAT in CAM Rocker Boot (Since September); pt has since self DC boot on 08/15/24  FALLS:  Has patient fallen  in last 6 months? No  LIVING ENVIRONMENT: Lives with: lives with their family and lives with their daughter Lives in: House/apartment Stairs: Yes: Internal: flight steps; on left going up Has following equipment at home: None  OCCUPATION: Herbalist in a Transport planner office.   PLOF: Independent  PATIENT GOALS: "I want to be able to daily activities without pain."  NEXT MD VISIT: 08/11/2023  OBJECTIVE:  Note: Objective measures were completed at Evaluation unless otherwise noted.  DIAGNOSTIC FINDINGS: MRI completed.   PATIENT SURVEYS:  Modified Oswestry 30/50 severe disability  LEFS 12/80 FOTO 36/60  COGNITION: Overall cognitive status: Within functional limits for tasks assessed     SENSATION: WFL  POSTURE: rounded shoulders and decreased lumbar lordosis  PALPATION: Pain  to L4/L5 and B SI  LOWER EXTREMITY ROM:  WFL RLE pain with SLR.   LOWER EXTREMITY MMT:  L hip 3/5 in all planes, R hip 3-/5 2/2 to pain., B knees 4/5, L ankle 4/5 R ankle deferred 2/2 to injury.  FUNCTIONAL TESTS:  30 seconds chair stand test: 10 reps completed 10 meter walk test: in 9 secs  FABER test Positive, SI compression positive, SLR positive to R, and R Slump test positive   SPECIAL TESTS:  -A/SLR test: pretest = painful, weak RT SLR, not much improvement when paired with pelvic compression, however noted  (08/18/23)                                                                                                                                TREATMENT DATE: 10/17/23    Subjective: Patient reports feeling "pretty good" - more good days than bad compared to outset of PT. Patient reports with standing a lot on Saturday, she had tightness along waist and some tightness in posterior thigh. Pt denies burning/tingling/shooting pain. Patient reports no pain at arrival to PT. Pt reports difficulty sometimes with initial steps - "leg doesn't want to move."   Pain: R low back and R  posterior hip pain,    OBJECTIVE FINDINGS  AROM Lumbar flexion 75% (mid-length of shins), pulling down length of RLE  Lumbar extension 75% (no pain)  Lateral flexion: R 50%*, L 50%* Thoracolumbar rotation: R WFL, L 75%*   Ther-ex   Open book; 1 x 10, 3 sec   Quadruped side bend, alternating R/L; x15 alternating R/L  Nustep level 4 x 8 minutes (seat 13)  Supine piriformis stretch; 2 x 30 seconds  Dying bug; 1x8 alternating UE/LE; heel tap at bottom of leg extension  Prone straight knee hip extension 2 x 10 BLE;    PATIENT EDUCATION: Updated home exercise program and encouraged continued HEP and plan of care through March.  *not today* Seated hip abduction with BTB 2 x 10  Child's pose stretch  2 x 30 seconds   Seated marching with BTB x 10 each LE  Bird dog 2 x 10     Manual Therapy   Prone STM and IASTM with Hypervolt to R lumbar paraspinals and QL; x 8 minutes Grade I-II CPA L3-L5, 30s/bout x 2 bouts/level;   PATIENT EDUCATION:  Education details: Pt educated throughout session about proper posture and technique with exercises. Improved exercise technique, movement at target joints, use of target muscles after min to mod verbal, visual, tactile cues.  Person educated: Patient Education method: Explanation Education comprehension: verbalized understanding  HOME EXERCISE PROGRAM: Access Code: [email or web address] URL: [email or web address] Date: 08/09/2023 Prepared by: Maylon Peppers  Exercises - Seated Piriformis Stretch  - 2-3 x daily - 5-7 x weekly - 3-5 reps - 30-60 sec hold - Seated Flexion Stretch  - 2-3 x daily - 5-7 x weekly - 10 reps - 5 sec hold - Supine Lower Trunk Rotation  - 2 x daily - 5-7 x weekly - 1-2 sets - 10 reps - 3-5 sec hold - Supine Single Knee to Chest Stretch  - 2 x daily - 5-7 x weekly - 1-2 sets - 10 reps - 3-5 sec hold - Supine Double Knee to Chest  - 2-3 x daily - 5-7 x weekly - 1-2 sets - 10 reps - 3-5 sec hold  Access  Code: [email or web address] URL: [email or web address] Date: 10/17/2023 Prepared by: Consuela Mimes  Exercises - Supine Bridge with Resistance Band  - 2-3 x daily - 5-7 x weekly - 3 sets - 10 reps - Clamshell with Resistance  - 2-3 x daily - 5-7 x weekly - 3 sets - 10 reps - Bird Dog  - 2-3 x daily - 5-7 x weekly - 2 sets - 10 reps - Supine Dead Bug with Leg Extension  - 2 x daily - 5-7 x weekly - 2 sets - 10 reps - Supine 90/90 Sciatic Nerve Glide with Knee Flexion/Extension  - 2-3 x daily - 7 x weekly - 2 sets - 10 reps - 1sec hold   ASSESSMENT:  CLINICAL IMPRESSION:      Patient has made good progress to date per self-report and with frequency of high-intensity episodes of pain. She does still experience intermittent re-occurrence of symptoms at 8-9/10 and has not met 30-sec sit to stand goal. Pt has made clinically progress with LEFS and Oswestry, but she has not yet met long-term goals for these outcome measures. Symptoms are still provoked with SLR (not cross SLR) and SLUMP testing. Pt has been excellent participant with PT and will need continued work on regular HEP to improve resiliency of lumbar spine and positional tolerance of prolonged upright/standing/walking activity. She will continue to benefit from skilled therapy to address remaining deficits in order to improve quality of life and return to PLOF.     OBJECTIVE IMPAIRMENTS: Abnormal gait, decreased activity tolerance, difficulty walking, decreased strength, and pain.   ACTIVITY LIMITATIONS: carrying, lifting, bending, sitting, standing, squatting, and transfers  PARTICIPATION LIMITATIONS: cleaning, laundry, driving, community activity, and occupation  PERSONAL FACTORS:  Age and Time since onset of injury/illness/exacerbation are also affecting patient's functional outcome.   REHAB POTENTIAL: Good  CLINICAL DECISION MAKING: Stable/uncomplicated  EVALUATION COMPLEXITY: Low   GOALS: Goals reviewed with patient?  Yes  SHORT TERM GOALS: Target date: 09/05/2023 Pt will become Independent with HEP to promote functional mobility Baseline: HEP to be furnished next session.    09/15/23: Patient is compliant with HEP/recounts exercises well.  Goal status: ACHIEVED   LONG TERM GOALS: Target date: 09/30/2023   Pt will reduce pain with all function to 2/10 to improve QOL    09/15/23: 8-9/10 at worst with cooking and cleaning activities in home.   10/17/23: 6/10 at worst  Goal status: IN PROGRESS  2.  Pt will complete >15 STS reps in 30 secs to demonstrate improve BLE strength Baseline: 10        09/15/23: 8   10/17/23: Deferred Goal status: NOT MET   3.  Pt will imporve LEFS score to >50/80 to demonstrate progress with LE functions and QOL Baseline: 12/80       09/15/23: 38/80    10/17/23:  Goal status: IN PROGRESS  4.  Pt will become SLR/Slump Test negative with pain Baseline: Positve       09/15/23: Positive for SLR/SLUMP/Negative cross SLR    10/17/23: Deferred Goal status: NOT MET   5.  Pt will improve ODI score to <15/50 to demonstrate improve pain and QOL.  Baseline: 30/50       09/15/23: 19/50.    10/17/23:  Goal status: IN PROGRESS   PLAN:  PT FREQUENCY: 1-2x/week  PT DURATION: 4 weeks  PLANNED INTERVENTIONS: 97110-Therapeutic exercises, 97530- Therapeutic activity, 97112- Neuromuscular re-education, 97535- Self Care, 51761- Manual therapy, and 97014- Electrical stimulation (unattended)  PLAN FOR NEXT SESSION: Continue as per POC. Flexion-bias exercise and progressive trunk stabilization, hip strengthening. Manual therapy and consideration of DN prn for symptom modulation.    Consuela Mimes, PT, DPT [phone] Physical Therapist - [facility] 12:50 PM, 09/01/23

## 2023-10-19 ENCOUNTER — Encounter: Payer: Self-pay | Admitting: Physical Therapy

## 2023-10-19 ENCOUNTER — Ambulatory Visit: Admitting: Physical Therapy

## 2023-10-19 DIAGNOSIS — M6281 Muscle weakness (generalized): Secondary | ICD-10-CM

## 2023-10-19 DIAGNOSIS — M5459 Other low back pain: Secondary | ICD-10-CM

## 2023-10-19 DIAGNOSIS — R262 Difficulty in walking, not elsewhere classified: Secondary | ICD-10-CM

## 2023-10-19 NOTE — Therapy (Signed)
 OUTPATIENT PHYSICAL THERAPY TREATMENT    Patient Name: [name] MRN: [id number] DOB:20-Nov-1980, 43 y.o., female Today's Date: 10/19/2023   END OF SESSION:  PT End of Session - 10/19/23 1637     Visit Number 11    Number of Visits 16    Date for PT Re-Evaluation 09/30/23    PT Start Time 1637    PT Stop Time 1716    PT Time Calculation (min) 39 min    Activity Tolerance Patient tolerated treatment well;No increased pain    Behavior During Therapy [facility] for tasks assessed/performed              Past Medical History:  Diagnosis Date   Anxiety    Arthritis    right ankle   Bony exostosis 09/2014   right knee   GERD (gastroesophageal reflux disease)    History of migraine    Hypertension    Neuromuscular disorder (HCC)    trigeminal nerve pain   Pain, eye, left 10/10/2014   with tingling - states possible shingles; to see PCP   Painful orthopaedic hardware (HCC) 09/2014   right ankle   Past Surgical History:  Procedure Laterality Date   BONE EXOSTOSIS EXCISION Right 10/16/2014   Procedure: EXCISION OF BONY EXOSTOSIS FROM RIGHT KNEE;  Surgeon: Wes Hamman, MD;  Location: [facility];  Service: Orthopedics;  Laterality: Right;   CHOLECYSTECTOMY  07/12/2006   DILATATION & CURRETTAGE/HYSTEROSCOPY WITH RESECTOCOPE N/A 09/24/2022   Procedure: DILATATION & CURETTAGE/HYSTEROSCOPY WITH RESECTOCOPE;  Surgeon: Ivery Marking, MD;  Location: [facility];  Service: Gynecology;  Laterality: N/A;   ENDOMETRIAL ABLATION N/A 09/24/2022   Procedure: ENDOMETRIAL ABLATION;  Surgeon: Ivery Marking, MD;  Location: [facility];  Service: Gynecology;  Laterality: N/A;   HARDWARE REMOVAL Right 10/16/2014   Procedure: HARDWARE REMOVAL RIGHT ANKLE;  Surgeon: Wes Hamman, MD;  Location: [facility];  Service: Orthopedics;  Laterality: Right;   KNEE ARTHROSCOPY Right 09/01/2007   ORIF ANKLE FRACTURE BIMALLEOLAR Right  02/28/2007   ORIF TIBIA FRACTURE Right 02/28/2007   rod placement right   TIBIA HARDWARE REMOVAL Right 09/01/2007   There are no active problems to display for this patient.   PCP: Jonne Netters MD  REFERRING PROVIDER: Rosezena Contes PA  REFERRING DIAG:  M54.50 (ICD-10-CM) - Lumbago  M25.551 (ICD-10-CM) - Right hip pain    THERAPY DIAG:  Low Back pain with radiculopathy to R hip, Muscle weakness  Rationale for Evaluation and Treatment: Rehabilitation  ONSET DATE: 06/02/2023  FROM INITIAL EVALUATION: SUBJECTIVE:   PERTINENT HISTORY: Pt is concern of pain to bilateral lower back and right hip pain. Her lower back pain began around six weeks ago without trauma or injury. She was seen by Telehealth with [facility] on 06/27/23 for back pain that had been present for one week. No trauma or injury prior to onset. She has been wearing a short CAM walking boot for foot fracture. Recent MRI shows Stress Reaction and no fracture. Pt advised to continue ot wear the boot. She was given prednisone 40 x 5 days. She did have relief while on prednisone but feels that pain worsened after completion of treatment. She has also had newer right hip pain for the past week or so. Bending forward, sitting, or movement while laying down causes worsening of pain. Walking eases her pain. She denies associated bowel incontinence, bladder incontinence, or saddle anesthesia. She has had some radiation of pain to her  right leg but this has not been consistent or persistent. She is already taking meloxicam daily for her foot but does not feel that it alleviates any of her back pain. Pt has self DC CAM Rocker on 08/15/23, foot has felt fine, but she plans to visit [employer] store for orthotic footwear.    PAIN:  Are you having pain? Yes, Rt posterior pelvis    PRECAUTIONS: None   WEIGHT BEARING RESTRICTIONS: WBAT in CAM Rocker Boot (Since September); pt has since self DC boot on 08/15/24  FALLS:  Has patient fallen  in last 6 months? No  LIVING ENVIRONMENT: Lives with: lives with their family and lives with their daughter Lives in: House/apartment Stairs: Yes: Internal: flight steps; on left going up Has following equipment at home: None  OCCUPATION: Herbalist in a Transport planner office.   PLOF: Independent  PATIENT GOALS: "I want to be able to daily activities without pain."  NEXT MD VISIT: 08/11/2023  OBJECTIVE:  Note: Objective measures were completed at Evaluation unless otherwise noted.  DIAGNOSTIC FINDINGS: MRI completed.   PATIENT SURVEYS:  Modified Oswestry 30/50 severe disability  LEFS 12/80 FOTO 36/60  COGNITION: Overall cognitive status: Within functional limits for tasks assessed     SENSATION: WFL  POSTURE: rounded shoulders and decreased lumbar lordosis  PALPATION: Pain  to L4/L5 and B SI  LOWER EXTREMITY ROM:  WFL RLE pain with SLR.   LOWER EXTREMITY MMT:  L hip 3/5 in all planes, R hip 3-/5 2/2 to pain., B knees 4/5, L ankle 4/5 R ankle deferred 2/2 to injury.  FUNCTIONAL TESTS:  30 seconds chair stand test: 10 reps completed 10 meter walk test: in 9 secs  FABER test Positive, SI compression positive, SLR positive to R, and R Slump test positive   SPECIAL TESTS:  -A/SLR test: pretest = painful, weak RT SLR, not much improvement when paired with pelvic compression, however noted  (08/18/23)                                                                                                                                TREATMENT DATE: 10/19/23    Subjective: Patient reports having trigger point injections yesterday in bilat upper traps and cervical paraspinals. Patient reports this did help with her symptoms. She reports no significant lower quarter symptoms at arrival to PT. Patient reports doing okay with her established home exercise program.    OBJECTIVE FINDINGS  AROM Lumbar flexion 75% (mid-length of shins), pulling down length of RLE  Lumbar  extension 75% (no pain)  Lateral flexion: R 50%*, L 50%* Thoracolumbar rotation: R WFL, L 75%*   Manual Therapy   Prone STM and IASTM with Hypervolt to R lumbar paraspinals and QL, bilat gluteal mm; x 10 minutes  *not today* Grade I-II CPA L3-L5, 30s/bout x 2 bouts/level;   Ther-ex   Nustep level 4 x 7 minutes (seat 13)  -interval subjective  gathered   Supine piriformis stretch; 2 x 30 sec, bilat   Open book; 1 x 10, 3 sec   Supine sciatic nerve gliding with knee flexion/extension; reviewed for HEP  -discussed modification to technique/intensity as needed for improved tolerance  Seated shoulder extension on Blue physioball; 2 x 10, Blue Tband   Seated on blue physioball; alternating arm/leg; 1 x 12 alternating R/L   PATIENT EDUCATION: Encouraged continued HEP and holding on heavy lifting/resistance drills until under day removed from injections.     *not today* Quadruped side bend, alternating R/L; x15 alternating R/L  Supine piriformis stretch; 2 x 30 seconds Dying bug; 1x8 alternating UE/LE; heel tap at bottom of leg extension Prone straight knee hip extension 2 x 10 BLE; Seated hip abduction with BTB 2 x 10  Child's pose stretch  2 x 30 seconds  Seated marching with BTB x 10 each LE  Bird dog 2 x 10       PATIENT EDUCATION:  Education details: Pt educated throughout session about proper posture and technique with exercises. Improved exercise technique, movement at target joints, use of target muscles after min to mod verbal, visual, tactile cues.  Person educated: Patient Education method: Explanation Education comprehension: verbalized understanding  HOME EXERCISE PROGRAM: Access Code: [email or web address] URL: [email or web address] Date: 08/09/2023 Prepared by: Janine Melbourne  Exercises - Seated Piriformis Stretch  - 2-3 x daily - 5-7 x weekly - 3-5 reps - 30-60 sec hold - Seated Flexion Stretch  - 2-3 x daily - 5-7 x weekly - 10 reps - 5 sec hold -  Supine Lower Trunk Rotation  - 2 x daily - 5-7 x weekly - 1-2 sets - 10 reps - 3-5 sec hold - Supine Single Knee to Chest Stretch  - 2 x daily - 5-7 x weekly - 1-2 sets - 10 reps - 3-5 sec hold - Supine Double Knee to Chest  - 2-3 x daily - 5-7 x weekly - 1-2 sets - 10 reps - 3-5 sec hold  Access Code: [email or web address] URL: [email or web address] Date: 10/17/2023 Prepared by: Denese Finn  Exercises - Supine Bridge with Resistance Band  - 2-3 x daily - 5-7 x weekly - 3 sets - 10 reps - Clamshell with Resistance  - 2-3 x daily - 5-7 x weekly - 3 sets - 10 reps - Bird Dog  - 2-3 x daily - 5-7 x weekly - 2 sets - 10 reps - Supine Dead Bug with Leg Extension  - 2 x daily - 5-7 x weekly - 2 sets - 10 reps - Supine 90/90 Sciatic Nerve Glide with Knee Flexion/Extension  - 2-3 x daily - 7 x weekly - 2 sets - 10 reps - 1sec hold   ASSESSMENT:  CLINICAL IMPRESSION:      Patient fortunately has minimal symptoms at this time, though pt does have intermittent episodic pain with prolonged static standing. Sheis ale to progress program to include physioball trunk stabilization. Pt has remaining deficits in thoracolumbar AROM, neural tension affecting R lower quarter/R iliolumbar and gluteal region, lower lumbar stiffness, and positional intolerance for prolonged standing/upright activity. She will continue to benefit from skilled therapy to address remaining deficits in order to improve quality of life and return to PLOF.     OBJECTIVE IMPAIRMENTS: Abnormal gait, decreased activity tolerance, difficulty walking, decreased strength, and pain.   ACTIVITY LIMITATIONS: carrying, lifting, bending, sitting, standing, squatting, and transfers  PARTICIPATION LIMITATIONS: cleaning, laundry, driving, community activity, and occupation  PERSONAL FACTORS: Age  and Time since onset of injury/illness/exacerbation are also affecting patient's functional outcome.   REHAB POTENTIAL: Good  CLINICAL DECISION  MAKING: Stable/uncomplicated  EVALUATION COMPLEXITY: Low   GOALS: Goals reviewed with patient? Yes  SHORT TERM GOALS: Target date: 09/05/2023 Pt will become Independent with HEP to promote functional mobility Baseline: HEP to be furnished next session.    09/15/23: Patient is compliant with HEP/recounts exercises well.  Goal status: ACHIEVED   LONG TERM GOALS: Target date: 09/30/2023   Pt will reduce pain with all function to 2/10 to improve QOL    09/15/23: 8-9/10 at worst with cooking and cleaning activities in home.   10/17/23: 6/10 at worst  Goal status: IN PROGRESS  2.  Pt will complete >15 STS reps in 30 secs to demonstrate improve BLE strength Baseline: 10        09/15/23: 8   10/17/23: Deferred Goal status: NOT MET   3.  Pt will imporve LEFS score to >50/80 to demonstrate progress with LE functions and QOL Baseline: 12/80       09/15/23: 38/80    10/17/23: 46/80 Goal status: IN PROGRESS  4.  Pt will become SLR/Slump Test negative with pain Baseline: Positve       09/15/23: Positive for SLR/SLUMP/Negative cross SLR    10/17/23: Deferred Goal status: NOT MET   5.  Pt will improve ODI score to <15/50 to demonstrate improve pain and QOL.  Baseline: 30/50       09/15/23: 19/50.    10/17/23: Partially completed, missed back page of mODI.   10/19/23: 17/50 Goal status: IN PROGRESS   PLAN:  PT FREQUENCY: 1-2x/week  PT DURATION: 4 weeks  PLANNED INTERVENTIONS: 97110-Therapeutic exercises, 97530- Therapeutic activity, 97112- Neuromuscular re-education, 97535- Self Care, 59563- Manual therapy, and 97014- Electrical stimulation (unattended)  PLAN FOR NEXT SESSION: Continue as per POC. Flexion-bias exercise and progressive trunk stabilization, hip strengthening. Manual therapy and consideration of DN prn for symptom modulation.    Denese Finn, PT, DPT [phone] Physical Therapist - [facility] 12:50 PM, 09/01/23

## 2023-10-24 ENCOUNTER — Ambulatory Visit: Admitting: Physical Therapy

## 2023-10-24 NOTE — Therapy (Deleted)
 OUTPATIENT PHYSICAL THERAPY TREATMENT    Patient Name: [name] MRN: [id number] DOB:[birth year], 43 y.o., female Today's Date: 10/24/2023   END OF SESSION:     Past Medical History:  Diagnosis Date   Anxiety    Arthritis    right ankle   Bony exostosis 09/2014   right knee   GERD (gastroesophageal reflux disease)    History of migraine    Hypertension    Neuromuscular disorder (HCC)    trigeminal nerve pain   Pain, eye, left 10/10/2014   with tingling - states possible shingles; to see PCP   Painful orthopaedic hardware (HCC) 09/2014   right ankle   Past Surgical History:  Procedure Laterality Date   BONE EXOSTOSIS EXCISION Right 10/16/2014   Procedure: EXCISION OF BONY EXOSTOSIS FROM RIGHT KNEE;  Surgeon: Wes Hamman, MD;  Location: [facility];  Service: Orthopedics;  Laterality: Right;   CHOLECYSTECTOMY  07/12/2006   DILATATION & CURRETTAGE/HYSTEROSCOPY WITH RESECTOCOPE N/A 09/24/2022   Procedure: DILATATION & CURETTAGE/HYSTEROSCOPY WITH RESECTOCOPE;  Surgeon: Ivery Marking, MD;  Location: [facility];  Service: Gynecology;  Laterality: N/A;   ENDOMETRIAL ABLATION N/A 09/24/2022   Procedure: ENDOMETRIAL ABLATION;  Surgeon: Ivery Marking, MD;  Location: [facility];  Service: Gynecology;  Laterality: N/A;   HARDWARE REMOVAL Right 10/16/2014   Procedure: HARDWARE REMOVAL RIGHT ANKLE;  Surgeon: Wes Hamman, MD;  Location: [facility];  Service: Orthopedics;  Laterality: Right;   KNEE ARTHROSCOPY Right 09/01/2007   ORIF ANKLE FRACTURE BIMALLEOLAR Right 02/28/2007   ORIF TIBIA FRACTURE Right 02/28/2007   rod placement right   TIBIA HARDWARE REMOVAL Right 09/01/2007   There are no active problems to display for this patient.   PCP: Jonne Netters MD  REFERRING PROVIDER: Rosezena Contes PA  REFERRING DIAG:  M54.50 (ICD-10-CM) - Lumbago  M25.551 (ICD-10-CM) - Right hip pain    THERAPY  DIAG:  Low Back pain with radiculopathy to R hip, Muscle weakness  Rationale for Evaluation and Treatment: Rehabilitation  ONSET DATE: 06/02/2023  FROM INITIAL EVALUATION: SUBJECTIVE:   PERTINENT HISTORY: Pt is concern of pain to bilateral lower back and right hip pain. Her lower back pain began around six weeks ago without trauma or injury. She was seen by Telehealth with [facility] on 06/27/23 for back pain that had been present for one week. No trauma or injury prior to onset. She has been wearing a short CAM walking boot for foot fracture. Recent MRI shows Stress Reaction and no fracture. Pt advised to continue ot wear the boot. She was given prednisone 40 x 5 days. She did have relief while on prednisone but feels that pain worsened after completion of treatment. She has also had newer right hip pain for the past week or so. Bending forward, sitting, or movement while laying down causes worsening of pain. Walking eases her pain. She denies associated bowel incontinence, bladder incontinence, or saddle anesthesia. She has had some radiation of pain to her right leg but this has not been consistent or persistent. She is already taking meloxicam daily for her foot but does not feel that it alleviates any of her back pain. Pt has self DC CAM Rocker on 08/15/23, foot has felt fine, but she plans to visit [employer] [facility] for orthotic footwear.    PAIN:  Are you having pain? Yes, Rt posterior pelvis    PRECAUTIONS: None   WEIGHT BEARING RESTRICTIONS: WBAT in CAM Rocker Boot (Since  September); pt has since self DC boot on 08/15/24  FALLS:  Has patient fallen in last 6 months? No  LIVING ENVIRONMENT: Lives with: lives with their family and lives with their daughter Lives in: House/apartment Stairs: Yes: Internal: flight steps; on left going up Has following equipment at home: None  OCCUPATION: Herbalist in a Transport planner office.   PLOF: Independent  PATIENT GOALS: "I want to be able  to daily activities without pain."  NEXT MD VISIT: 08/11/2023  OBJECTIVE:  Note: Objective measures were completed at Evaluation unless otherwise noted.  DIAGNOSTIC FINDINGS: MRI completed.   PATIENT SURVEYS:  Modified Oswestry 30/50 severe disability  LEFS 12/80 FOTO 36/60  COGNITION: Overall cognitive status: Within functional limits for tasks assessed     SENSATION: WFL  POSTURE: rounded shoulders and decreased lumbar lordosis  PALPATION: Pain  to L4/L5 and B SI  LOWER EXTREMITY ROM:  WFL RLE pain with SLR.   LOWER EXTREMITY MMT:  L hip 3/5 in all planes, R hip 3-/5 2/2 to pain., B knees 4/5, L ankle 4/5 R ankle deferred 2/2 to injury.  FUNCTIONAL TESTS:  30 seconds chair stand test: 10 reps completed 10 meter walk test: in 9 secs  FABER test Positive, SI compression positive, SLR positive to R, and R Slump test positive   SPECIAL TESTS:  -A/SLR test: pretest = painful, weak RT SLR, not much improvement when paired with pelvic compression, however noted  (08/18/23)                                                                                                                                TREATMENT DATE: 10/24/23    Subjective: Patient reports having trigger point injections yesterday in bilat upper traps and cervical paraspinals. Patient reports this did help with her symptoms. She reports no significant lower quarter symptoms at arrival to PT. Patient reports doing okay with her established home exercise program.    OBJECTIVE FINDINGS  AROM Lumbar flexion 75% (mid-length of shins), pulling down length of RLE  Lumbar extension 75% (no pain)  Lateral flexion: R 50%*, L 50%* Thoracolumbar rotation: R WFL, L 75%*   Manual Therapy   Prone STM and IASTM with Hypervolt to R lumbar paraspinals and QL, bilat gluteal mm; x 10 minutes  *not today* Grade I-II CPA L3-L5, 30s/bout x 2 bouts/level;   Ther-ex   Nustep level 4 x 7 minutes (seat 13)  -interval  subjective gathered   Supine piriformis stretch; 2 x 30 sec, bilat   Open book; 1 x 10, 3 sec   Supine sciatic nerve gliding with knee flexion/extension; reviewed for HEP  -discussed modification to technique/intensity as needed for improved tolerance  Seated shoulder extension on Blue physioball; 2 x 10, Blue Tband   Seated on blue physioball; alternating arm/leg; 1 x 12 alternating R/L   PATIENT EDUCATION: Encouraged continued HEP and holding on heavy lifting/resistance drills until under day  removed from injections.     *not today* Quadruped side bend, alternating R/L; x15 alternating R/L  Supine piriformis stretch; 2 x 30 seconds Dying bug; 1x8 alternating UE/LE; heel tap at bottom of leg extension Prone straight knee hip extension 2 x 10 BLE; Seated hip abduction with BTB 2 x 10  Child's pose stretch  2 x 30 seconds  Seated marching with BTB x 10 each LE  Bird dog 2 x 10       PATIENT EDUCATION:  Education details: Pt educated throughout session about proper posture and technique with exercises. Improved exercise technique, movement at target joints, use of target muscles after min to mod verbal, visual, tactile cues.  Person educated: Patient Education method: Explanation Education comprehension: verbalized understanding  HOME EXERCISE PROGRAM: Access Code: [email or web address] URL: [email or web address] Date: 08/09/2023 Prepared by: Maylon Peppers  Exercises - Seated Piriformis Stretch  - 2-3 x daily - 5-7 x weekly - 3-5 reps - 30-60 sec hold - Seated Flexion Stretch  - 2-3 x daily - 5-7 x weekly - 10 reps - 5 sec hold - Supine Lower Trunk Rotation  - 2 x daily - 5-7 x weekly - 1-2 sets - 10 reps - 3-5 sec hold - Supine Single Knee to Chest Stretch  - 2 x daily - 5-7 x weekly - 1-2 sets - 10 reps - 3-5 sec hold - Supine Double Knee to Chest  - 2-3 x daily - 5-7 x weekly - 1-2 sets - 10 reps - 3-5 sec hold  Access Code: [email or web address] URL:  [email or web address] Date: 10/17/2023 Prepared by: Consuela Mimes  Exercises - Supine Bridge with Resistance Band  - 2-3 x daily - 5-7 x weekly - 3 sets - 10 reps - Clamshell with Resistance  - 2-3 x daily - 5-7 x weekly - 3 sets - 10 reps - Bird Dog  - 2-3 x daily - 5-7 x weekly - 2 sets - 10 reps - Supine Dead Bug with Leg Extension  - 2 x daily - 5-7 x weekly - 2 sets - 10 reps - Supine 90/90 Sciatic Nerve Glide with Knee Flexion/Extension  - 2-3 x daily - 7 x weekly - 2 sets - 10 reps - 1sec hold   ASSESSMENT:  CLINICAL IMPRESSION:      Patient fortunately has minimal symptoms at this time, though pt does have intermittent episodic pain with prolonged static standing. Sheis ale to progress program to include physioball trunk stabilization. Pt has remaining deficits in thoracolumbar AROM, neural tension affecting R lower quarter/R iliolumbar and gluteal region, lower lumbar stiffness, and positional intolerance for prolonged standing/upright activity. She will continue to benefit from skilled therapy to address remaining deficits in order to improve quality of life and return to PLOF.     OBJECTIVE IMPAIRMENTS: Abnormal gait, decreased activity tolerance, difficulty walking, decreased strength, and pain.   ACTIVITY LIMITATIONS: carrying, lifting, bending, sitting, standing, squatting, and transfers  PARTICIPATION LIMITATIONS: cleaning, laundry, driving, community activity, and occupation  PERSONAL FACTORS: Age and Time since onset of injury/illness/exacerbation are also affecting patient's functional outcome.   REHAB POTENTIAL: Good  CLINICAL DECISION MAKING: Stable/uncomplicated  EVALUATION COMPLEXITY: Low   GOALS: Goals reviewed with patient? Yes  SHORT TERM GOALS: Target date: 09/05/2023 Pt will become Independent with HEP to promote functional mobility Baseline: HEP to be furnished next session.    09/15/23: Patient is compliant with HEP/recounts exercises  well.  Goal status: ACHIEVED   LONG TERM GOALS: Target date: 09/30/2023  Pt will reduce pain with all function to 2/10 to improve QOL    09/15/23: 8-9/10 at worst with cooking and cleaning activities in home.   10/17/23: 6/10 at worst  Goal status: IN PROGRESS  2.  Pt will complete >15 STS reps in 30 secs to demonstrate improve BLE strength Baseline: 10        09/15/23: 8   10/17/23: Deferred Goal status: NOT MET   3.  Pt will imporve LEFS score to >50/80 to demonstrate progress with LE functions and QOL Baseline: 12/80       09/15/23: 38/80    10/17/23: 46/80 Goal status: IN PROGRESS  4.  Pt will become SLR/Slump Test negative with pain Baseline: Positve       09/15/23: Positive for SLR/SLUMP/Negative cross SLR    10/17/23: Deferred Goal status: NOT MET   5.  Pt will improve ODI score to <15/50 to demonstrate improve pain and QOL.  Baseline: 30/50       09/15/23: 19/50.    10/17/23: Partially completed, missed back page of mODI.   10/19/23: 17/50 Goal status: IN PROGRESS   PLAN:  PT FREQUENCY: 1-2x/week  PT DURATION: 4 weeks  PLANNED INTERVENTIONS: 97110-Therapeutic exercises, 97530- Therapeutic activity, 97112- Neuromuscular re-education, 97535- Self Care, 16109- Manual therapy, and 97014- Electrical stimulation (unattended)  PLAN FOR NEXT SESSION: Continue as per POC. Flexion-bias exercise and progressive trunk stabilization, hip strengthening. Manual therapy and consideration of DN prn for symptom modulation.    Denese Finn, PT, DPT [phone] Physical Therapist - [facility] 12:50 PM, 09/01/23

## 2023-10-26 ENCOUNTER — Encounter: Admitting: Physical Therapy

## 2023-10-31 ENCOUNTER — Ambulatory Visit: Admitting: Physical Therapy

## 2023-10-31 NOTE — Therapy (Deleted)
 OUTPATIENT PHYSICAL THERAPY TREATMENT    Patient Name: [name] MRN: [id number] DOB:[birth year], 43 y.o., female Today's Date: 10/31/2023   END OF SESSION:     Past Medical History:  Diagnosis Date   Anxiety    Arthritis    right ankle   Bony exostosis 09/2014   right knee   GERD (gastroesophageal reflux disease)    History of migraine    Hypertension    Neuromuscular disorder (HCC)    trigeminal nerve pain   Pain, eye, left 10/10/2014   with tingling - states possible shingles; to see PCP   Painful orthopaedic hardware (HCC) 09/2014   right ankle   Past Surgical History:  Procedure Laterality Date   BONE EXOSTOSIS EXCISION Right 10/16/2014   Procedure: EXCISION OF BONY EXOSTOSIS FROM RIGHT KNEE;  Surgeon: Wes Hamman, MD;  Location: [facility];  Service: Orthopedics;  Laterality: Right;   CHOLECYSTECTOMY  07/12/2006   DILATATION & CURRETTAGE/HYSTEROSCOPY WITH RESECTOCOPE N/A 09/24/2022   Procedure: DILATATION & CURETTAGE/HYSTEROSCOPY WITH RESECTOCOPE;  Surgeon: Ivery Marking, MD;  Location: [facility];  Service: Gynecology;  Laterality: N/A;   ENDOMETRIAL ABLATION N/A 09/24/2022   Procedure: ENDOMETRIAL ABLATION;  Surgeon: Ivery Marking, MD;  Location: [facility];  Service: Gynecology;  Laterality: N/A;   HARDWARE REMOVAL Right 10/16/2014   Procedure: HARDWARE REMOVAL RIGHT ANKLE;  Surgeon: Wes Hamman, MD;  Location: [facility];  Service: Orthopedics;  Laterality: Right;   KNEE ARTHROSCOPY Right 09/01/2007   ORIF ANKLE FRACTURE BIMALLEOLAR Right 02/28/2007   ORIF TIBIA FRACTURE Right 02/28/2007   rod placement right   TIBIA HARDWARE REMOVAL Right 09/01/2007   There are no active problems to display for this patient.   PCP: Jonne Netters MD  REFERRING PROVIDER: Rosezena Contes PA  REFERRING DIAG:  M54.50 (ICD-10-CM) - Lumbago  M25.551 (ICD-10-CM) - Right hip pain    THERAPY  DIAG:  Low Back pain with radiculopathy to R hip, Muscle weakness  Rationale for Evaluation and Treatment: Rehabilitation  ONSET DATE: 06/02/2023  FROM INITIAL EVALUATION: SUBJECTIVE:   PERTINENT HISTORY: Pt is concern of pain to bilateral lower back and right hip pain. Her lower back pain began around six weeks ago without trauma or injury. She was seen by Telehealth with [facility] on 06/27/23 for back pain that had been present for one week. No trauma or injury prior to onset. She has been wearing a short CAM walking boot for foot fracture. Recent MRI shows Stress Reaction and no fracture. Pt advised to continue ot wear the boot. She was given prednisone 40 x 5 days. She did have relief while on prednisone but feels that pain worsened after completion of treatment. She has also had newer right hip pain for the past week or so. Bending forward, sitting, or movement while laying down causes worsening of pain. Walking eases her pain. She denies associated bowel incontinence, bladder incontinence, or saddle anesthesia. She has had some radiation of pain to her right leg but this has not been consistent or persistent. She is already taking meloxicam  daily for her foot but does not feel that it alleviates any of her back pain. Pt has self DC CAM Rocker on 08/15/23, foot has felt fine, but she plans to visit [employer] [facility] for orthotic footwear.    PAIN:  Are you having pain? Yes, Rt posterior pelvis    PRECAUTIONS: None   WEIGHT BEARING RESTRICTIONS: WBAT in CAM Rocker Boot (Since  September); pt has since self DC boot on 08/15/24  FALLS:  Has patient fallen in last 6 months? No  LIVING ENVIRONMENT: Lives with: lives with their family and lives with their daughter Lives in: House/apartment Stairs: Yes: Internal: flight steps; on left going up Has following equipment at home: None  OCCUPATION: Herbalist in a Transport planner office.   PLOF: Independent  PATIENT GOALS: "I want to be able  to daily activities without pain."  NEXT MD VISIT: 08/11/2023  OBJECTIVE:  Note: Objective measures were completed at Evaluation unless otherwise noted.  DIAGNOSTIC FINDINGS: MRI completed.   PATIENT SURVEYS:  Modified Oswestry 30/50 severe disability  LEFS 12/80 FOTO 36/60  COGNITION: Overall cognitive status: Within functional limits for tasks assessed     SENSATION: WFL  POSTURE: rounded shoulders and decreased lumbar lordosis  PALPATION: Pain  to L4/L5 and B SI  LOWER EXTREMITY ROM:  WFL RLE pain with SLR.   LOWER EXTREMITY MMT:  L hip 3/5 in all planes, R hip 3-/5 2/2 to pain., B knees 4/5, L ankle 4/5 R ankle deferred 2/2 to injury.  FUNCTIONAL TESTS:  30 seconds chair stand test: 10 reps completed 10 meter walk test: in 9 secs  FABER test Positive, SI compression positive, SLR positive to R, and R Slump test positive   SPECIAL TESTS:  -A/SLR test: pretest = painful, weak RT SLR, not much improvement when paired with pelvic compression, however noted  (08/18/23)                                                                                                                                TREATMENT DATE: 10/31/23    Subjective: Patient reports having trigger point injections yesterday in bilat upper traps and cervical paraspinals. Patient reports this did help with her symptoms. She reports no significant lower quarter symptoms at arrival to PT. Patient reports doing okay with her established home exercise program.    OBJECTIVE FINDINGS  AROM Lumbar flexion 75% (mid-length of shins), pulling down length of RLE  Lumbar extension 75% (no pain)  Lateral flexion: R 50%*, L 50%* Thoracolumbar rotation: R WFL, L 75%*   Manual Therapy   Prone STM and IASTM with Hypervolt to R lumbar paraspinals and QL, bilat gluteal mm; x 10 minutes  *not today* Grade I-II CPA L3-L5, 30s/bout x 2 bouts/level;   Ther-ex   Nustep level 4 x 7 minutes (seat 13)  -interval  subjective gathered   Supine piriformis stretch; 2 x 30 sec, bilat   Open book; 1 x 10, 3 sec   Supine sciatic nerve gliding with knee flexion/extension; reviewed for HEP  -discussed modification to technique/intensity as needed for improved tolerance  Seated shoulder extension on Blue physioball; 2 x 10, Blue Tband   Seated on blue physioball; alternating arm/leg; 1 x 12 alternating R/L   PATIENT EDUCATION: Encouraged continued HEP and holding on heavy lifting/resistance drills until under day  removed from injections.     *not today* Quadruped side bend, alternating R/L; x15 alternating R/L  Supine piriformis stretch; 2 x 30 seconds Dying bug; 1x8 alternating UE/LE; heel tap at bottom of leg extension Prone straight knee hip extension 2 x 10 BLE; Seated hip abduction with BTB 2 x 10  Child's pose stretch  2 x 30 seconds  Seated marching with BTB x 10 each LE  Bird dog 2 x 10       PATIENT EDUCATION:  Education details: Pt educated throughout session about proper posture and technique with exercises. Improved exercise technique, movement at target joints, use of target muscles after min to mod verbal, visual, tactile cues.  Person educated: Patient Education method: Explanation Education comprehension: verbalized understanding  HOME EXERCISE PROGRAM: Access Code: [email or web address] URL: [email or web address] Date: 08/09/2023 Prepared by: Janine Melbourne  Exercises - Seated Piriformis Stretch  - 2-3 x daily - 5-7 x weekly - 3-5 reps - 30-60 sec hold - Seated Flexion Stretch  - 2-3 x daily - 5-7 x weekly - 10 reps - 5 sec hold - Supine Lower Trunk Rotation  - 2 x daily - 5-7 x weekly - 1-2 sets - 10 reps - 3-5 sec hold - Supine Single Knee to Chest Stretch  - 2 x daily - 5-7 x weekly - 1-2 sets - 10 reps - 3-5 sec hold - Supine Double Knee to Chest  - 2-3 x daily - 5-7 x weekly - 1-2 sets - 10 reps - 3-5 sec hold  Access Code: [email or web address] URL:  [email or web address] Date: 10/17/2023 Prepared by: Denese Finn  Exercises - Supine Bridge with Resistance Band  - 2-3 x daily - 5-7 x weekly - 3 sets - 10 reps - Clamshell with Resistance  - 2-3 x daily - 5-7 x weekly - 3 sets - 10 reps - Bird Dog  - 2-3 x daily - 5-7 x weekly - 2 sets - 10 reps - Supine Dead Bug with Leg Extension  - 2 x daily - 5-7 x weekly - 2 sets - 10 reps - Supine 90/90 Sciatic Nerve Glide with Knee Flexion/Extension  - 2-3 x daily - 7 x weekly - 2 sets - 10 reps - 1sec hold   ASSESSMENT:  CLINICAL IMPRESSION:      Patient fortunately has minimal symptoms at this time, though pt does have intermittent episodic pain with prolonged static standing. Sheis ale to progress program to include physioball trunk stabilization. Pt has remaining deficits in thoracolumbar AROM, neural tension affecting R lower quarter/R iliolumbar and gluteal region, lower lumbar stiffness, and positional intolerance for prolonged standing/upright activity. She will continue to benefit from skilled therapy to address remaining deficits in order to improve quality of life and return to PLOF.     OBJECTIVE IMPAIRMENTS: Abnormal gait, decreased activity tolerance, difficulty walking, decreased strength, and pain.   ACTIVITY LIMITATIONS: carrying, lifting, bending, sitting, standing, squatting, and transfers  PARTICIPATION LIMITATIONS: cleaning, laundry, driving, community activity, and occupation  PERSONAL FACTORS: Age and Time since onset of injury/illness/exacerbation are also affecting patient's functional outcome.   REHAB POTENTIAL: Good  CLINICAL DECISION MAKING: Stable/uncomplicated  EVALUATION COMPLEXITY: Low   GOALS: Goals reviewed with patient? Yes  SHORT TERM GOALS: Target date: 09/05/2023 Pt will become Independent with HEP to promote functional mobility Baseline: HEP to be furnished next session.    09/15/23: Patient is compliant with HEP/recounts exercises  well.  Goal status: ACHIEVED   LONG TERM GOALS: Target date: 09/30/2023  Pt will reduce pain with all function to 2/10 to improve QOL    09/15/23: 8-9/10 at worst with cooking and cleaning activities in home.   10/17/23: 6/10 at worst  Goal status: IN PROGRESS  2.  Pt will complete >15 STS reps in 30 secs to demonstrate improve BLE strength Baseline: 10        09/15/23: 8   10/17/23: Deferred Goal status: NOT MET   3.  Pt will imporve LEFS score to >50/80 to demonstrate progress with LE functions and QOL Baseline: 12/80       09/15/23: 38/80    10/17/23: 46/80 Goal status: IN PROGRESS  4.  Pt will become SLR/Slump Test negative with pain Baseline: Positve       09/15/23: Positive for SLR/SLUMP/Negative cross SLR    10/17/23: Deferred Goal status: NOT MET   5.  Pt will improve ODI score to <15/50 to demonstrate improve pain and QOL.  Baseline: 30/50       09/15/23: 19/50.    10/17/23: Partially completed, missed back page of mODI.   10/19/23: 17/50 Goal status: IN PROGRESS   PLAN:  PT FREQUENCY: 1-2x/week  PT DURATION: 4 weeks  PLANNED INTERVENTIONS: 97110-Therapeutic exercises, 97530- Therapeutic activity, 97112- Neuromuscular re-education, 97535- Self Care, 04540- Manual therapy, and 97014- Electrical stimulation (unattended)  PLAN FOR NEXT SESSION: Continue as per POC. Flexion-bias exercise and progressive trunk stabilization, hip strengthening. Manual therapy and consideration of DN prn for symptom modulation.    Denese Finn, PT, DPT [phone] Physical Therapist - [facility] 12:50 PM, 09/01/23

## 2023-11-02 ENCOUNTER — Ambulatory Visit: Admitting: Physical Therapy

## 2023-11-07 ENCOUNTER — Ambulatory Visit: Admitting: Physical Therapy

## 2023-11-07 DIAGNOSIS — M5459 Other low back pain: Secondary | ICD-10-CM

## 2023-11-07 DIAGNOSIS — R262 Difficulty in walking, not elsewhere classified: Secondary | ICD-10-CM

## 2023-11-07 DIAGNOSIS — M6281 Muscle weakness (generalized): Secondary | ICD-10-CM

## 2023-11-07 NOTE — Therapy (Unsigned)
 OUTPATIENT PHYSICAL THERAPY TREATMENT/GOAL UPDATE AND TRANSITION TO HEP   Patient Name: [name] MRN: [id number] DOB:1981/06/20, 43 y.o., female Today's Date: 11/07/2023   END OF SESSION:  PT End of Session - 11/07/23 1637     Visit Number 12    Number of Visits 16    Date for PT Re-Evaluation 09/30/23    PT Start Time 1637    PT Stop Time 1715    PT Time Calculation (min) 38 min    Activity Tolerance Patient tolerated treatment well;No increased pain    Behavior During Therapy [facility] for tasks assessed/performed               Past Medical History:  Diagnosis Date   Anxiety    Arthritis    right ankle   Bony exostosis 09/2014   right knee   GERD (gastroesophageal reflux disease)    History of migraine    Hypertension    Neuromuscular disorder (HCC)    trigeminal nerve pain   Pain, eye, left 10/10/2014   with tingling - states possible shingles; to see PCP   Painful orthopaedic hardware (HCC) 09/2014   right ankle   Past Surgical History:  Procedure Laterality Date   BONE EXOSTOSIS EXCISION Right 10/16/2014   Procedure: EXCISION OF BONY EXOSTOSIS FROM RIGHT KNEE;  Surgeon: Wes Hamman, MD;  Location: [facility];  Service: Orthopedics;  Laterality: Right;   CHOLECYSTECTOMY  07/12/2006   DILATATION & CURRETTAGE/HYSTEROSCOPY WITH RESECTOCOPE N/A 09/24/2022   Procedure: DILATATION & CURETTAGE/HYSTEROSCOPY WITH RESECTOCOPE;  Surgeon: Ivery Marking, MD;  Location: [facility];  Service: Gynecology;  Laterality: N/A;   ENDOMETRIAL ABLATION N/A 09/24/2022   Procedure: ENDOMETRIAL ABLATION;  Surgeon: Ivery Marking, MD;  Location: [facility];  Service: Gynecology;  Laterality: N/A;   HARDWARE REMOVAL Right 10/16/2014   Procedure: HARDWARE REMOVAL RIGHT ANKLE;  Surgeon: Wes Hamman, MD;  Location:  [facility];  Service: Orthopedics;  Laterality: Right;   KNEE ARTHROSCOPY Right 09/01/2007   ORIF  ANKLE FRACTURE BIMALLEOLAR Right 02/28/2007   ORIF TIBIA FRACTURE Right 02/28/2007   rod placement right   TIBIA HARDWARE REMOVAL Right 09/01/2007   There are no active problems to display for this patient.   PCP: Jonne Netters MD  REFERRING PROVIDER: Rosezena Contes PA  REFERRING DIAG:  M54.50 (ICD-10-CM) - Lumbago  M25.551 (ICD-10-CM) - Right hip pain    THERAPY DIAG:  Low Back pain with radiculopathy to R hip, Muscle weakness  Rationale for Evaluation and Treatment: Rehabilitation  ONSET DATE: 06/02/2023  FROM INITIAL EVALUATION: SUBJECTIVE:   PERTINENT HISTORY: Pt is concern of pain to bilateral lower back and right hip pain. Her lower back pain began around six weeks ago without trauma or injury. She was seen by Telehealth with [facility] on 06/27/23 for back pain that had been present for one week. No trauma or injury prior to onset. She has been wearing a short CAM walking boot for foot fracture. Recent MRI shows Stress Reaction and no fracture. Pt advised to continue ot wear the boot. She was given prednisone 40 x 5 days. She did have relief while on prednisone but feels that pain worsened after completion of treatment. She has also had newer right hip pain for the past week or so. Bending forward, sitting, or movement while laying down causes worsening of pain. Walking eases her pain. She denies associated bowel incontinence, bladder incontinence, or saddle anesthesia. She has had some  radiation of pain to her right leg but this has not been consistent or persistent. She is already taking meloxicam  daily for her foot but does not feel that it alleviates any of her back pain. Pt has self DC CAM Rocker on 08/15/23, foot has felt fine, but she plans to visit [employer] [facility] for orthotic footwear.    PAIN:  Are you having pain? Yes, Rt posterior pelvis    PRECAUTIONS: None   WEIGHT BEARING RESTRICTIONS: WBAT in CAM Rocker Boot (Since September); pt has since self DC boot on  08/15/24  FALLS:  Has patient fallen in last 6 months? No  LIVING ENVIRONMENT: Lives with: lives with their family and lives with their daughter Lives in: House/apartment Stairs: Yes: Internal: flight steps; on left going up Has following equipment at home: None  OCCUPATION: Herbalist in a Transport planner office.   PLOF: Independent  PATIENT GOALS: "I want to be able to daily activities without pain."  NEXT MD VISIT: 08/11/2023  OBJECTIVE:  Note: Objective measures were completed at Evaluation unless otherwise noted.  DIAGNOSTIC FINDINGS: MRI completed.   PATIENT SURVEYS:  Modified Oswestry 30/50 severe disability  LEFS 12/80 FOTO 36/60  COGNITION: Overall cognitive status: Within functional limits for tasks assessed     SENSATION: WFL  POSTURE: rounded shoulders and decreased lumbar lordosis  PALPATION: Pain  to L4/L5 and B SI  LOWER EXTREMITY ROM:  WFL RLE pain with SLR.   LOWER EXTREMITY MMT:  L hip 3/5 in all planes, R hip 3-/5 2/2 to pain., B knees 4/5, L ankle 4/5 R ankle deferred 2/2 to injury.  FUNCTIONAL TESTS:  30 seconds chair stand test: 10 reps completed 10 meter walk test: in 9 secs  FABER test Positive, SI compression positive, SLR positive to R, and R Slump test positive   SPECIAL TESTS:  -A/SLR test: pretest = painful, weak RT SLR, not much improvement when paired with pelvic compression, however noted  (08/18/23)                                                                                                                                TREATMENT DATE: 11/07/23    Subjective: Patient reports "twinge here and there" in regard to back pain. We held on PT last week due to significant vertigo symptoms and limited ability to participate in PT. Pt feels that she is "still weak in a sense." Patient reports she has to pace herself with completion of household chores. Pain up to 3/10 over this previous week. No disturbed sleep. Patient reports no  baseline pain.    OBJECTIVE FINDINGS  AROM Lumbar flexion 75% (mid-length of shins), pulling down length of RLE  Lumbar extension 75% (no pain)  Lateral flexion: R 50%*, L 50%* Thoracolumbar rotation: R WFL, L 75%*   Manual Therapy   Prone STM and IASTM with Hypervolt to R lumbar paraspinals and QL, bilat gluteal mm; x  10 minutes  *not today* Grade I-II CPA L3-L5, 30s/bout x 2 bouts/level;   Ther-ex   Nustep level 4 x 7 minutes (seat 13)  -interval subjective gathered    *GOAL UPDATE PERFORMED  Supine piriformis stretch; 2 x 30 sec, bilat   Open book; 1 x 10, 3 sec   Supine sciatic nerve gliding with knee flexion/extension; reviewed for HEP  -discussed modification to technique/intensity as needed for improved tolerance  Seated shoulder extension on Blue physioball; 2 x 10, Blue Tband   Seated on blue physioball; alternating arm/leg; 1 x 12 alternating R/L   PATIENT EDUCATION: Encouraged continued HEP and holding on heavy lifting/resistance drills until under day removed from injections.     *not today* Quadruped side bend, alternating R/L; x15 alternating R/L  Supine piriformis stretch; 2 x 30 seconds Dying bug; 1x8 alternating UE/LE; heel tap at bottom of leg extension Prone straight knee hip extension 2 x 10 BLE; Seated hip abduction with BTB 2 x 10  Child's pose stretch  2 x 30 seconds  Seated marching with BTB x 10 each LE  Bird dog 2 x 10       PATIENT EDUCATION:  Education details: Pt educated throughout session about proper posture and technique with exercises. Improved exercise technique, movement at target joints, use of target muscles after min to mod verbal, visual, tactile cues.  Person educated: Patient Education method: Explanation Education comprehension: verbalized understanding  HOME EXERCISE PROGRAM: Access Code: [email or web address] URL: [email or web address] Date: 08/09/2023 Prepared by: Janine Melbourne  Exercises - Seated  Piriformis Stretch  - 2-3 x daily - 5-7 x weekly - 3-5 reps - 30-60 sec hold - Seated Flexion Stretch  - 2-3 x daily - 5-7 x weekly - 10 reps - 5 sec hold - Supine Lower Trunk Rotation  - 2 x daily - 5-7 x weekly - 1-2 sets - 10 reps - 3-5 sec hold - Supine Single Knee to Chest Stretch  - 2 x daily - 5-7 x weekly - 1-2 sets - 10 reps - 3-5 sec hold - Supine Double Knee to Chest  - 2-3 x daily - 5-7 x weekly - 1-2 sets - 10 reps - 3-5 sec hold  Access Code: [email or web address] URL: [email or web address] Date: 10/17/2023 Prepared by: Denese Finn  Exercises - Supine Bridge with Resistance Band  - 2-3 x daily - 5-7 x weekly - 3 sets - 10 reps - Clamshell with Resistance  - 2-3 x daily - 5-7 x weekly - 3 sets - 10 reps - Bird Dog  - 2-3 x daily - 5-7 x weekly - 2 sets - 10 reps - Supine Dead Bug with Leg Extension  - 2 x daily - 5-7 x weekly - 2 sets - 10 reps - Supine 90/90 Sciatic Nerve Glide with Knee Flexion/Extension  - 2-3 x daily - 7 x weekly - 2 sets - 10 reps - 1sec hold   ASSESSMENT:  CLINICAL IMPRESSION:      Patient fortunately has minimal symptoms at this time, though pt does have intermittent episodic pain with prolonged static standing. Sheis ale to progress program to include physioball trunk stabilization. Pt has remaining deficits in thoracolumbar AROM, neural tension affecting R lower quarter/R iliolumbar and gluteal region, lower lumbar stiffness, and positional intolerance for prolonged standing/upright activity. She will continue to benefit from skilled therapy to address remaining deficits in order to improve quality of life and return to PLOF.     OBJECTIVE IMPAIRMENTS: Abnormal gait, decreased  activity tolerance, difficulty walking, decreased strength, and pain.   ACTIVITY LIMITATIONS: carrying, lifting, bending, sitting, standing, squatting, and transfers  PARTICIPATION LIMITATIONS: cleaning, laundry, driving, community activity, and occupation  PERSONAL  FACTORS: Age and Time since onset of injury/illness/exacerbation are also affecting patient's functional outcome.   REHAB POTENTIAL: Good  CLINICAL DECISION MAKING: Stable/uncomplicated  EVALUATION COMPLEXITY: Low   GOALS: Goals reviewed with patient? Yes  SHORT TERM GOALS: Target date: 09/05/2023 Pt will become Independent with HEP to promote functional mobility Baseline: HEP to be furnished next session.    09/15/23: Patient is compliant with HEP/recounts exercises well.  Goal status: ACHIEVED   LONG TERM GOALS: Target date: 09/30/2023   Pt will reduce pain with all function to 2/10 to improve QOL    09/15/23: 8-9/10 at worst with cooking and cleaning activities in home.   10/17/23: 6/10 at worst   11/07/23: Up to 3/10 at worst.  Goal status: IN PROGRESS/MOSTLY MET   2.  Pt will complete >15 STS reps in 30 secs to demonstrate improve BLE strength Baseline: 10        09/15/23: 8   10/17/23: Deferred   11/07/23: 6 Goal status: NOT MET   3.  Pt will imporve LEFS score to >50/80 to demonstrate progress with LE functions and QOL Baseline: 12/80       09/15/23: 38/80    10/17/23: 46/80    11/07/23: 59/80 Goal status: ACHIEVED  4.  Pt will become SLR/Slump Test negative with pain Baseline: Positve       09/15/23: Positive for SLR/SLUMP/Negative cross SLR    10/17/23: Deferred   11/07/23: Positive with SLUMP on R side.  Goal status: NOT MET   5.  Pt will improve ODI score to <15/50 to demonstrate improve pain and QOL.  Baseline: 30/50       09/15/23: 19/50.    10/17/23: Partially completed, missed back page of mODI.   10/19/23: 17/50    11/07/23: 12/50 Goal status: ACHIEVED   PLAN:  PT FREQUENCY: -  PT DURATION: -  PLANNED INTERVENTIONS: 97110-Therapeutic exercises, 97530- Therapeutic activity, 97112- Neuromuscular re-education, 97535- Self Care, 16109- Manual therapy, and 97014- Electrical stimulation (unattended)  PLAN FOR NEXT SESSION: Pt to continue with advanced HEP, activity modification prn, and  continued regular exercise during week as able. Pt may f/u with PT if she experiences regression in her condition.     Denese Finn, PT, DPT [phone] Physical Therapist - [facility] 12:50 PM, 09/01/23

## 2023-11-08 ENCOUNTER — Encounter: Payer: Self-pay | Admitting: Physical Therapy

## 2023-11-09 ENCOUNTER — Ambulatory Visit: Admitting: Physical Therapy

## 2023-12-14 ENCOUNTER — Other Ambulatory Visit: Payer: Self-pay

## 2023-12-14 ENCOUNTER — Encounter: Payer: Self-pay | Admitting: Emergency Medicine

## 2023-12-14 ENCOUNTER — Observation Stay

## 2023-12-14 ENCOUNTER — Other Ambulatory Visit
Admission: RE | Admit: 2023-12-14 | Discharge: 2023-12-14 | Disposition: A | Discharge status: Home / Self Care | Source: Ambulatory Visit | Attending: Internal Medicine | Admitting: Internal Medicine

## 2023-12-14 ENCOUNTER — Observation Stay
Admission: EM | Admit: 2023-12-14 | Discharge: 2023-12-16 | Disposition: A | Discharge status: Home / Self Care | Attending: Internal Medicine | Admitting: Internal Medicine

## 2023-12-14 ENCOUNTER — Emergency Department

## 2023-12-14 DIAGNOSIS — R799 Abnormal finding of blood chemistry, unspecified: Secondary | ICD-10-CM | POA: Insufficient documentation

## 2023-12-14 DIAGNOSIS — I2699 Other pulmonary embolism without acute cor pulmonale: Principal | ICD-10-CM | POA: Diagnosis present

## 2023-12-14 DIAGNOSIS — R079 Chest pain, unspecified: Secondary | ICD-10-CM | POA: Insufficient documentation

## 2023-12-14 DIAGNOSIS — Z86711 Personal history of pulmonary embolism: Secondary | ICD-10-CM | POA: Diagnosis present

## 2023-12-14 DIAGNOSIS — Z7901 Long term (current) use of anticoagulants: Secondary | ICD-10-CM | POA: Insufficient documentation

## 2023-12-14 DIAGNOSIS — R0789 Other chest pain: Secondary | ICD-10-CM | POA: Diagnosis present

## 2023-12-14 DIAGNOSIS — F17210 Nicotine dependence, cigarettes, uncomplicated: Secondary | ICD-10-CM | POA: Insufficient documentation

## 2023-12-14 DIAGNOSIS — I10 Essential (primary) hypertension: Secondary | ICD-10-CM | POA: Insufficient documentation

## 2023-12-14 DIAGNOSIS — R911 Solitary pulmonary nodule: Secondary | ICD-10-CM | POA: Insufficient documentation

## 2023-12-14 DIAGNOSIS — R0602 Shortness of breath: Secondary | ICD-10-CM | POA: Insufficient documentation

## 2023-12-14 DIAGNOSIS — B0229 Other postherpetic nervous system involvement: Secondary | ICD-10-CM | POA: Insufficient documentation

## 2023-12-14 DIAGNOSIS — D696 Thrombocytopenia, unspecified: Secondary | ICD-10-CM | POA: Diagnosis not present

## 2023-12-14 LAB — CBC WITH DIFFERENTIAL/PLATELET
Abs Immature Granulocytes: 0.03 10*3/uL (ref 0.00–0.07)
Basophils Absolute: 0 10*3/uL (ref 0.0–0.1)
Basophils Relative: 0 %
Eosinophils Absolute: 0 10*3/uL (ref 0.0–0.5)
Eosinophils Relative: 0 %
HCT: 36.8 % (ref 36.0–46.0)
Hemoglobin: 12.5 g/dL (ref 12.0–15.0)
Immature Granulocytes: 0 %
Lymphocytes Relative: 6 %
Lymphs Abs: 0.6 10*3/uL — ABNORMAL LOW (ref 0.7–4.0)
MCH: 30.3 pg (ref 26.0–34.0)
MCHC: 34 g/dL (ref 30.0–36.0)
MCV: 89.1 fL (ref 80.0–100.0)
Monocytes Absolute: 0.2 10*3/uL (ref 0.1–1.0)
Monocytes Relative: 3 %
Neutro Abs: 8.4 10*3/uL — ABNORMAL HIGH (ref 1.7–7.7)
Neutrophils Relative %: 91 %
Platelets: 90 10*3/uL — ABNORMAL LOW (ref 150–400)
RBC: 4.13 MIL/uL (ref 3.87–5.11)
RDW: 13.8 % (ref 11.5–15.5)
WBC: 9.3 10*3/uL (ref 4.0–10.5)
nRBC: 0 % (ref 0.0–0.2)

## 2023-12-14 LAB — TROPONIN I (HIGH SENSITIVITY)
Troponin I (High Sensitivity): <2 ng/L (ref <18)
Troponin I (High Sensitivity): 4 ng/L (ref <18)

## 2023-12-14 LAB — HIV ANTIBODY (ROUTINE TESTING W REFLEX): HIV Screen 4th Generation wRfx: NONREACTIVE

## 2023-12-14 LAB — D-DIMER, QUANTITATIVE: D-Dimer, Quant: 2.67 ug{FEU}/mL — ABNORMAL HIGH (ref 0.00–0.50)

## 2023-12-14 MED ORDER — ONDANSETRON HCL 4 MG PO TABS: 4.0000 mg | ORAL_TABLET | Freq: Four times a day (QID) | ORAL | Status: DC | PRN

## 2023-12-14 MED ORDER — LIDOCAINE 5 % EX PTCH
1.0000 | MEDICATED_PATCH | CUTANEOUS | Status: DC
  Administered 2023-12-14 – 2023-12-15 (×2): 1 via TRANSDERMAL
  Filled 2023-12-14 (×2): qty 1

## 2023-12-14 MED ORDER — PANTOPRAZOLE SODIUM 40 MG PO TBEC
40.0000 mg | DELAYED_RELEASE_TABLET | Freq: Two times a day (BID) | ORAL | Status: DC
  Administered 2023-12-14 – 2023-12-16 (×4): 40 mg via ORAL
  Filled 2023-12-14 (×4): qty 1

## 2023-12-14 MED ORDER — APIXABAN 5 MG PO TABS
10.0000 mg | ORAL_TABLET | Freq: Two times a day (BID) | ORAL | Status: DC
  Administered 2023-12-14: 10 mg via ORAL
  Filled 2023-12-14: qty 2

## 2023-12-14 MED ORDER — OXYCODONE HCL 5 MG PO TABS
5.0000 mg | ORAL_TABLET | Freq: Four times a day (QID) | ORAL | Status: DC | PRN
  Administered 2023-12-14 – 2023-12-15 (×3): 10 mg via ORAL
  Administered 2023-12-15 – 2023-12-16 (×2): 5 mg via ORAL
  Filled 2023-12-14: qty 2
  Filled 2023-12-14 (×2): qty 1
  Filled 2023-12-14 (×2): qty 2

## 2023-12-14 MED ORDER — APIXABAN 5 MG PO TABS: 5.0000 mg | ORAL_TABLET | Freq: Two times a day (BID) | ORAL | Status: DC

## 2023-12-14 MED ORDER — IOHEXOL 350 MG/ML SOLN
75.0000 mL | Freq: Once | INTRAVENOUS | Status: AC | PRN
  Administered 2023-12-14: 75 mL via INTRAVENOUS

## 2023-12-14 MED ORDER — GABAPENTIN 100 MG PO CAPS: 100.0000 mg | ORAL_CAPSULE | Freq: Three times a day (TID) | ORAL | Status: DC

## 2023-12-14 MED ORDER — HYDROCHLOROTHIAZIDE 12.5 MG PO TABS
12.5000 mg | ORAL_TABLET | Freq: Every day | ORAL | Status: DC
  Administered 2023-12-15 – 2023-12-16 (×2): 12.5 mg via ORAL
  Filled 2023-12-14 (×2): qty 1

## 2023-12-14 MED ORDER — POLYETHYLENE GLYCOL 3350 17 G PO PACK: 17.0000 g | PACK | Freq: Every day | ORAL | Status: DC | PRN

## 2023-12-14 MED ORDER — GABAPENTIN 300 MG PO CAPS
600.0000 mg | ORAL_CAPSULE | Freq: Two times a day (BID) | ORAL | Status: DC
  Administered 2023-12-14 – 2023-12-16 (×4): 600 mg via ORAL
  Filled 2023-12-14 (×4): qty 2

## 2023-12-14 MED ORDER — ACETAMINOPHEN 500 MG PO TABS
1000.0000 mg | ORAL_TABLET | Freq: Three times a day (TID) | ORAL | Status: DC
  Administered 2023-12-14 – 2023-12-16 (×5): 1000 mg via ORAL
  Filled 2023-12-14 (×5): qty 2

## 2023-12-14 MED ORDER — ORAL CARE MOUTH RINSE
15.0000 mL | OROMUCOSAL | Status: DC | PRN
Start: 2023-12-14 — End: 2023-12-16

## 2023-12-14 MED ORDER — CARBAMAZEPINE ER 200 MG PO TB12
200.0000 mg | ORAL_TABLET | Freq: Two times a day (BID) | ORAL | Status: DC
  Administered 2023-12-14 – 2023-12-16 (×4): 200 mg via ORAL
  Filled 2023-12-14 (×4): qty 1

## 2023-12-14 MED ORDER — ONDANSETRON HCL 4 MG/2ML IJ SOLN
4.0000 mg | Freq: Four times a day (QID) | INTRAMUSCULAR | Status: DC | PRN
  Administered 2023-12-15 (×2): 4 mg via INTRAVENOUS
  Filled 2023-12-14 (×2): qty 2

## 2023-12-14 MED ORDER — SODIUM CHLORIDE 0.9% FLUSH
3.0000 mL | Freq: Two times a day (BID) | INTRAVENOUS | Status: DC
  Administered 2023-12-14 – 2023-12-16 (×4): 3 mL via INTRAVENOUS

## 2023-12-14 MED ORDER — METOPROLOL SUCCINATE ER 25 MG PO TB24
25.0000 mg | ORAL_TABLET | Freq: Every day | ORAL | Status: DC
  Administered 2023-12-15 – 2023-12-16 (×2): 25 mg via ORAL
  Filled 2023-12-14 (×2): qty 1

## 2023-12-14 NOTE — Assessment & Plan Note (Signed)
-

## 2023-12-14 NOTE — Assessment & Plan Note (Signed)
 9 mm large right upper lobe lung nodule noted.  - Repeat CT chest in 3 months

## 2023-12-14 NOTE — Assessment & Plan Note (Signed)
 -  Resume home antihypertensives tomorrow if blood pressure remains stable

## 2023-12-14 NOTE — Assessment & Plan Note (Signed)
 Likely reactive in the setting of PE.  - Recheck CBC in the a.m.

## 2023-12-14 NOTE — ED Provider Notes (Signed)
 [facility] Provider Note    Event Date/Time   First MD Initiated Contact with Patient 12/14/23 1506     (approximate)   History   Abnormal Lab   HPI  [name] is a 43 y.o. female with history of trigeminal neuralgia, presenting from her primary care doctor's office for an elevated D-dimer.  Patient states that she has been having 2 days of left-sided chest pain.  States that pain is reproducible.  Worse when she takes deep breath.  No recent travel or surgeries.  Not on any hormones, no history of DVTs although she does state her family history does have DVTs.  No unilateral calf pain or tenderness, no hemoptysis.  No history of malignancies.  States that she also noted a possible bug bite to her left lateral thoracic cage that is nontender, not vesicular, not pruritic.  Independent chart review, she went to primary care doctors today to be evaluated, they got a UA, CMP as well as a D-dimer, the D-dimer is elevated, UA is not consistent with UTI.  Creatinine is normal.  Patient states that she is status post ablation and is unlikely to be pregnant.     Physical Exam   Triage Vital Signs: ED Triage Vitals  Encounter Vitals Group     BP 12/14/23 1502 (!) 143/90     Systolic BP Percentile --      Diastolic BP Percentile --      Pulse Rate 12/14/23 1502 100     Resp 12/14/23 1502 17     Temp 12/14/23 1502 98.6 F (37 C)     Temp Source 12/14/23 1502 Oral     SpO2 12/14/23 1502 97 %     Weight --      Height --      Head Circumference --      Peak Flow --      Pain Score 12/14/23 1505 8     Pain Loc --      Pain Education --      Exclude from Growth Chart --     Most recent vital signs: Vitals:   12/14/23 1630 12/14/23 1700  BP: 128/82 130/85  Pulse: 76 85  Resp: 15 19  Temp:    SpO2: 96% 98%     General: Awake, no distress.  CV:  Good peripheral perfusion.  Resp:  Normal effort.  Clear, left anterior lateral thoracic cage is  tender on palpation, there is a 1 cm raised papular area that is nontender or pruritic, no overlying vesicular lesion, no fluctuance or induration Abd:  No distention.  Soft nontender Other:  No unilateral calf swing or tenderness, no lower extremity edema   ED Results / Procedures / Treatments   Labs (all labs ordered are listed, but only abnormal results are displayed) Labs Reviewed  CBC WITH DIFFERENTIAL/PLATELET - Abnormal; Notable for the following components:      Result Value   Platelets 90 (*)    Neutro Abs 8.4 (*)    Lymphs Abs 0.6 (*)    All other components within normal limits  TROPONIN I (HIGH SENSITIVITY)  TROPONIN I (HIGH SENSITIVITY)     EKG  EKG shows, sinus rhythm, rate 85, normal QRS, normal QTc, T wave flattening in aVL, no ischemic ST elevation, no prior to compare   RADIOLOGY On my independent interpretation, CT showed left-sided PE   PROCEDURES:  Critical Care performed: No  .Critical Care  Performed by:  Shane Darling, MD Authorized by: Shane Darling, MD   Critical care provider statement:    Critical care time (minutes):  40   Critical care was necessary to treat or prevent imminent or life-threatening deterioration of the following conditions:  Circulatory failure   Critical care was time spent personally by me on the following activities:  Development of treatment plan with patient or surrogate, discussions with consultants, evaluation of patient's response to treatment, examination of patient, ordering and review of laboratory studies, ordering and review of radiographic studies, ordering and performing treatments and interventions, pulse oximetry, re-evaluation of patient's condition and review of old charts    MEDICATIONS ORDERED IN ED: Medications  lidocaine  (LIDODERM ) 5 % 1 patch (1 patch Transdermal Patch Applied 12/14/23 1533)  iohexol (OMNIPAQUE) 350 MG/ML injection 75 mL (75 mLs Intravenous Contrast Given 12/14/23 1552)     IMPRESSION /  MDM / ASSESSMENT AND PLAN / ED COURSE  I reviewed the triage vital signs and the nursing notes.                              Differential diagnosis includes, but is not limited to, costochondritis, musculoskeletal pain, strain, rash does not appear to be cellulitis or zoster, suspect this might be a bug bite, also considered ACS, given a D-dimer is elevated, consider PE.  Will get CBC, troponin, EKG, CT PE study.  Patient is agreeable with this plan.  Will also give her Lidoderm  patch, offered Tylenol  but patient states that she took some Tylenol  several hours ago.  Patient's presentation is most consistent with acute presentation with potential threat to life or bodily function.  Independent interpretation of labs and imaging below.  Given the PE noted on CT, no RV strain, patient will need to be admitted for further management and anticoagulation. Discussed with patient about imaging and lab results including incidental findings, she will follow-up with her primary care doctor outpatient for the right pulmonary nodule.  Will consult hospitalist for admission.  The patient is on the cardiac monitor to evaluate for evidence of arrhythmia and/or significant heart rate changes.   Clinical Course as of 12/14/23 1732  Wed Dec 14, 2023  1640 CT Angio Chest PE W/Cm &/Or Wo Cm IMPRESSION: 1. Acute pulmonary thromboemboli in the LEFT lower lobe segmental pulmonary arteries. Moderate clot burden. No evidence of RIGHT ventricular strain. 2. Small peripheral densities in the LEFT lower lobe could represent small infarctions. 3. Right solid pulmonary nodule within the upper lobe measuring 9 mm. Per Fleischner Society Guidelines, consider a non-contrast Chest CT at 3 months, a PET/CT, or tissue sampling. These guidelines do not apply to immunocompromised patients and patients with cancer. Follow up in patients with significant comorbidities as clinically warranted. For lung cancer screening, adhere to  Lung-RADS guidelines. Reference: Radiology. 2017; 284(1):228-43.   [TT]    Clinical Course User Index [TT] Drenda Gentle Richard Champion, MD     FINAL CLINICAL IMPRESSION(S) / ED DIAGNOSES   Final diagnoses:  Other acute pulmonary embolism, unspecified whether acute cor pulmonale present [facility])  Pulmonary nodule  Chest pain, unspecified type     Rx / DC Orders   ED Discharge Orders     None        Note:  This document was prepared using Dragon voice recognition software and may include unintentional dictation errors.    Shane Darling, MD 12/14/23 [phone]

## 2023-12-14 NOTE — ED Triage Notes (Signed)
 Patient to ED via POV for abnormal lab. Sent from [facility] for a d-dimer of 2.67. Originally seen for possible infection of the lungs.

## 2023-12-14 NOTE — Progress Notes (Signed)
 Pt admitted to room 207 at 1840 from ED. VSS on RA. Pt reporting pain 8/10 in left chest. PRN oxycodone  administered. Oriented pt to room and call bell within reach.

## 2023-12-14 NOTE — Assessment & Plan Note (Addendum)
 Patient is presenting with 2 days of left-sided chest pain, found to have a LLL pulmonary embolism with moderate clot burden.  No evidence of RV strain, hemodynamic instability or hypoxia.  Given strong family history, will obtain hypercoagulable workup (obtained prior to administration of anticoagulation).  - Eliquis twice daily - Second troponin pending - Telemetry monitoring overnight - Hypercoagulable workup pending - Bilateral lower extremity Doppler

## 2023-12-14 NOTE — H&P (Addendum)
 History and Physical    Patient: [name]:[id number] DOB: 26-Nov-1980 DOA: 12/14/2023 DOS: the patient was seen and examined on 12/14/2023 PCP: Lyle San, MD  Patient coming from: Home  Chief Complaint:  Chief Complaint  Patient presents with   Abnormal Lab   HPI: [name] is a 43 y.o. female with medical history significant of Migraines, trigeminal neuralgia, hypertension, who presents to the ED due to chest pain.  [name] states that she awoke in the middle of the night early on June 2 with left-sided chest pain, sweating, and chills.  She was worried that she was coming down with pneumonia.  She notes that since then, her left-sided chest pain has continued to worsen.  Chest pain is exacerbated by deep breathing.  She denies any shortness of breath at rest.  Otherwise, she denies any recent travel, illness, surgery.  She denies any nausea, vomiting, diarrhea, abdominal pain.  She does that approximately 1 month ago, she had an episode of right lower leg pain with swelling that came and went.  Both parents had history of DVTs, with her mother having DVT prior to the age of 9, and her father sometime before the age of 15.  ED course: On arrival to the ED, patient was hypertensive at 143/90 with heart rate of 94.  She was saturating at 97% on room air.  She was afebrile at 98.6.  Initial workup notable for platelets of 90 and negative troponin.  CTA with acute PE in the left lower lobe with moderate clot burden but no evidence of RV strain.  Small densities noted concerning for infarct.  TRH contacted for admission.   Review of Systems: As mentioned in the history of present illness. All other systems reviewed and are negative.  Past Medical History:  Diagnosis Date   Anxiety    Arthritis    right ankle   Bony exostosis 09/2014   right knee   GERD (gastroesophageal reflux disease)    History of migraine    Hypertension    Neuromuscular disorder (HCC)     trigeminal nerve pain   Pain, eye, left 10/10/2014   with tingling - states possible shingles; to see PCP   Painful orthopaedic hardware (HCC) 09/2014   right ankle   Past Surgical History:  Procedure Laterality Date   BONE EXOSTOSIS EXCISION Right 10/16/2014   Procedure: EXCISION OF BONY EXOSTOSIS FROM RIGHT KNEE;  Surgeon: Wes Hamman, MD;  Location: [facility];  Service: Orthopedics;  Laterality: Right;   CHOLECYSTECTOMY  07/12/2006   DILATATION & CURRETTAGE/HYSTEROSCOPY WITH RESECTOCOPE N/A 09/24/2022   Procedure: DILATATION & CURETTAGE/HYSTEROSCOPY WITH RESECTOCOPE;  Surgeon: Ivery Marking, MD;  Location: [facility] [place];  Service: Gynecology;  Laterality: N/A;   ENDOMETRIAL ABLATION N/A 09/24/2022   Procedure: ENDOMETRIAL ABLATION;  Surgeon: Ivery Marking, MD;  Location: [facility] [place];  Service: Gynecology;  Laterality: N/A;   HARDWARE REMOVAL Right 10/16/2014   Procedure: HARDWARE REMOVAL RIGHT ANKLE;  Surgeon: Wes Hamman, MD;  Location: [facility];  Service: Orthopedics;  Laterality: Right;   KNEE ARTHROSCOPY Right 09/01/2007   ORIF ANKLE FRACTURE BIMALLEOLAR Right 02/28/2007   ORIF TIBIA FRACTURE Right 02/28/2007   rod placement right   TIBIA HARDWARE REMOVAL Right 09/01/2007   Social History:  reports that she has been smoking cigarettes. She has never used smokeless tobacco. She reports current alcohol use. She reports that she does not use drugs.  Allergies  Allergen Reactions   Penicillin G Other (See Comments)   Yeast-Derived Drug Products Other (See Comments)    States she avoids yeast products as they cause her yeast infections   Penicillins Other (See Comments)    UNKNOWN - AS AN INFANT    Family History  Problem Relation Age of Onset   Lupus Mother    Heart disease Father    Rheum arthritis Father    Myasthenia gravis Maternal Grandmother    Diabetes Paternal Grandmother    Breast cancer  Paternal Grandmother 31   Colon cancer Paternal Uncle    Colon cancer Maternal Uncle     Prior to Admission medications   Medication Sig Start Date End Date Taking? Authorizing Provider  carbamazepine (CARBATROL) 300 MG 12 hr capsule Take 300 mg by mouth 2 (two) times daily. Patient not taking: Reported on 10/19/2023    [provider]  cholecalciferol (VITAMIN D3) 25 MCG (1000 UNIT) tablet Take 1,000 Units by mouth daily.    [provider]  cyclobenzaprine (FLEXERIL) 10 MG tablet Take by mouth.    [provider]  diclofenac Sodium (VOLTAREN) 1 % GEL APPLY 2 TO 4 GRAMS AA BID 05/13/15   [provider]  gabapentin (NEURONTIN) 100 MG capsule Take 100 mg by mouth 3 (three) times daily.    [provider]  hydrochlorothiazide (HYDRODIURIL) 12.5 MG tablet Take by mouth. 07/22/21 09/24/22  [provider]  meloxicam  (MOBIC ) 15 MG tablet Take 1 tablet (15 mg total) by mouth daily. 05/03/23   Dot Gazella, DPM  metoprolol succinate (TOPROL-XL) 50 MG 24 hr tablet Take 1 tablet by mouth daily. 07/22/21   [provider]  pantoprazole (PROTONIX) 40 MG tablet Take 40 mg by mouth daily.    [provider]  pregabalin (LYRICA) 150 MG capsule Take 150 mg in the morning and 300 mg nightly Patient not taking: Reported on 10/19/2023 04/25/23   [provider]  sertraline  (ZOLOFT ) 100 MG tablet Take 1 tablet (100 mg total) by mouth daily. 09/24/22   Ivery Marking, MD  traMADol  (ULTRAM ) 50 MG tablet TAKE ONE TABLET BY MOUTH EVERY 6 HOURS AS NEEDED 06/17/23   Dot Gazella, DPM  valACYclovir (VALTREX) 1000 MG tablet Take 1,000 mg by mouth 2 (two) times daily. 10/12/16   [provider]    Physical Exam: Vitals:   12/14/23 1505 12/14/23 1630 12/14/23 1643 12/14/23 1700  BP: (!) 143/90 128/82  130/85  Pulse: 98 76  85  Resp: 17 15  19   Temp: 98.6 F (37 C)     TempSrc: Oral     SpO2:  96%  98%  Weight:   98.1 kg    Height:   5\' 8"  (1.727 m)    Physical Exam Vitals and nursing note reviewed.  Constitutional:      General: She is not in acute distress.    Appearance: She is normal weight. She is not toxic-appearing.  HENT:     Head: Atraumatic.     Mouth/Throat:     Mouth: Mucous membranes are moist.     Pharynx: Oropharynx is clear.  Eyes:     Conjunctiva/sclera: Conjunctivae normal.     Pupils: Pupils are equal, round, and reactive to light.  Cardiovascular:     Rate and Rhythm: Normal rate and regular rhythm.     Heart sounds: No murmur heard.    No gallop.  Pulmonary:     Effort: Pulmonary effort is normal.  No respiratory distress.     Breath sounds: No wheezing, rhonchi or rales.  Abdominal:     General: Bowel sounds are normal. There is no distension.     Palpations: Abdomen is soft.     Tenderness: There is no abdominal tenderness. There is no guarding.  Musculoskeletal:     Right lower leg: No edema.     Left lower leg: No edema.  Skin:    General: Skin is warm and dry.  Neurological:     General: No focal deficit present.     Mental Status: She is alert and oriented to person, place, and time. Mental status is at baseline.  Psychiatric:        Mood and Affect: Mood normal.        Behavior: Behavior normal.    Data Reviewed: CBC with WBC of 9.3, hemoglobin of 12.5, platelets of 90 Troponin less than 2  EKG personally reviewed.  Sinus rhythm with rate of 85.  No ST or T wave changes concerning for acute ischemia.  Results are pending, will review when available.  Assessment and Plan:  * Acute pulmonary embolism (HCC) Patient is presenting with 2 days of left-sided chest pain, found to have a LLL pulmonary embolism with moderate clot burden.  No evidence of RV strain, hemodynamic instability or hypoxia.  Given strong family history, will obtain hypercoagulable workup (obtained prior to administration of anticoagulation).  - Eliquis twice daily - Second troponin  pending - Telemetry monitoring overnight - Hypercoagulable workup pending - Bilateral lower extremity Doppler  Thrombocytopenia (HCC) Likely reactive in the setting of PE.  - Recheck CBC in the a.m.  Lung nodule 9 mm large right upper lobe lung nodule noted.  - Repeat CT chest in 3 months  Postherpetic neuralgia - Continue home gabapentin  Essential hypertension - Resume home antihypertensives tomorrow if blood pressure remains stable  Advance Care Planning:   Code Status: Full Code   Consults: None  Family Communication: No family at bedside  Severity of Illness: The appropriate patient status for this patient is OBSERVATION. Observation status is judged to be reasonable and necessary in order to provide the required intensity of service to ensure the patient's safety. The patient's presenting symptoms, physical exam findings, and initial radiographic and laboratory data in the context of their medical condition is felt to place them at decreased risk for further clinical deterioration. Furthermore, it is anticipated that the patient will be medically stable for discharge from the hospital within 2 midnights of admission.   Author: Avi Body, MD 12/14/2023 5:53 PM  For on call review [email or web address].

## 2023-12-15 DIAGNOSIS — I2699 Other pulmonary embolism without acute cor pulmonale: Secondary | ICD-10-CM | POA: Diagnosis not present

## 2023-12-15 LAB — CBC WITH DIFFERENTIAL/PLATELET
Abs Immature Granulocytes: 0.02 10*3/uL (ref 0.00–0.07)
Basophils Absolute: 0 10*3/uL (ref 0.0–0.1)
Basophils Relative: 0 %
Eosinophils Absolute: 0 10*3/uL (ref 0.0–0.5)
Eosinophils Relative: 0 %
HCT: 36 % (ref 36.0–46.0)
Hemoglobin: 12.3 g/dL (ref 12.0–15.0)
Immature Granulocytes: 0 %
Lymphocytes Relative: 18 %
Lymphs Abs: 1.6 10*3/uL (ref 0.7–4.0)
MCH: 31 pg (ref 26.0–34.0)
MCHC: 34.2 g/dL (ref 30.0–36.0)
MCV: 90.7 fL (ref 80.0–100.0)
Monocytes Absolute: 0.6 10*3/uL (ref 0.1–1.0)
Monocytes Relative: 7 %
Neutro Abs: 6.5 10*3/uL (ref 1.7–7.7)
Neutrophils Relative %: 75 %
Platelets: 103 10*3/uL — ABNORMAL LOW (ref 150–400)
RBC: 3.97 MIL/uL (ref 3.87–5.11)
RDW: 14 % (ref 11.5–15.5)
WBC: 8.8 10*3/uL (ref 4.0–10.5)
nRBC: 0 % (ref 0.0–0.2)

## 2023-12-15 LAB — BASIC METABOLIC PANEL WITH GFR
Anion gap: 8 (ref 5–15)
BUN: 9 mg/dL (ref 6–20)
CO2: 28 mmol/L (ref 22–32)
Calcium: 8.9 mg/dL (ref 8.9–10.3)
Chloride: 107 mmol/L (ref 98–111)
Creatinine, Ser: 0.57 mg/dL (ref 0.44–1.00)
GFR, Estimated: >60 mL/min (ref >60)
Glucose, Bld: 103 mg/dL — ABNORMAL HIGH (ref 70–99)
Potassium: 3.1 mmol/L — ABNORMAL LOW (ref 3.5–5.1)
Sodium: 143 mmol/L (ref 135–145)

## 2023-12-15 LAB — ANTITHROMBIN III: AntiThromb III Func: 132 % — ABNORMAL HIGH (ref 75–120)

## 2023-12-15 MED ORDER — ENOXAPARIN SODIUM 100 MG/ML IJ SOSY
1.0000 mg/kg | PREFILLED_SYRINGE | Freq: Two times a day (BID) | INTRAMUSCULAR | Status: DC
  Administered 2023-12-15 – 2023-12-16 (×3): 100 mg via SUBCUTANEOUS
  Filled 2023-12-15 (×3): qty 1

## 2023-12-15 MED ORDER — POTASSIUM CHLORIDE CRYS ER 20 MEQ PO TBCR
40.0000 meq | EXTENDED_RELEASE_TABLET | ORAL | Status: AC
  Administered 2023-12-15 (×2): 40 meq via ORAL
  Filled 2023-12-15 (×2): qty 2

## 2023-12-15 MED ORDER — HYDROMORPHONE HCL 1 MG/ML IJ SOLN
1.0000 mg | INTRAMUSCULAR | Status: DC | PRN
  Administered 2023-12-15: 1 mg via INTRAVENOUS
  Filled 2023-12-15: qty 1

## 2023-12-15 NOTE — Progress Notes (Signed)
 Progress Note   Patient: [name]:[id number] DOB: November 03, 1980 DOA: 12/14/2023     0 DOS: the patient was seen and examined on 12/15/2023   Brief hospital course:  [name] is a 43 y.o. female with medical history significant of Migraines, trigeminal neuralgia, hypertension, who presents to the ED due to chest pain.   Ms. Croft states that she awoke in the middle of the night early on June 2 with left-sided chest pain, sweating, and chills.  She was worried that she was coming down with pneumonia.  She notes that since then, her left-sided chest pain has continued to worsen.  Chest pain is exacerbated by deep breathing.  She denies any shortness of breath at rest.  Otherwise, she denies any recent travel, illness, surgery.  She denies any nausea, vomiting, diarrhea, abdominal pain.  She does that approximately 1 month ago, she had an episode of right lower leg pain with swelling that came and went.   Both parents had history of DVTs, with her mother having DVT prior to the age of 17, and her father sometime before the age of 50.   ED course: On arrival to the ED, patient was hypertensive at 143/90 with heart rate of 94.  She was saturating at 97% on room air.  She was afebrile at 98.6.  Initial workup notable for platelets of 90 and negative troponin.  CTA with acute PE in the left lower lobe with moderate clot burden but no evidence of RV strain.  Small densities noted concerning for infarct.  TRH contacted for admission.  Assessment and Plan:  * Acute pulmonary embolism (HCC) Patient is presenting with 2 days of left-sided chest pain, found to have a LLL pulmonary embolism with moderate clot burden.  No evidence of RV strain, hemodynamic instability or hypoxia.  Given strong family history, will obtain hypercoagulable workup (obtained prior to administration of anticoagulation).   Due to contraindication with taking Eliquis and carbamazepine Eliquis have been  discontinued Continue Lovenox - Continue to follow-up hypercoagulable workup pending Doppler ultrasound ruled out VTE   Thrombocytopenia (HCC) Likely reactive in the setting of PE. Monitor CBC   Lung nodule 9 mm large right upper lobe lung nodule noted.   - Repeat CT chest in 3 months   Postherpetic neuralgia - Continue home gabapentin   Essential hypertension Will resume antihypertensives as blood pressure allows   Advance Care Planning:   Code Status: Full Code    Consults: None   Family Communication: No family at bedside     Subjective:  Patient seen and examined at bedside this morning She did have some midsternal chest pain Denies nausea vomiting abdominal pain cough  Physical Exam Vitals and nursing note reviewed.  Constitutional:      General: She is not in acute distress.    Appearance: She is normal weight. She is not toxic-appearing.  HENT:     Head: Atraumatic.     Mouth/Throat:     Mouth: Mucous membranes are moist.     Pharynx: Oropharynx is clear.  Eyes:     Conjunctiva/sclera: Conjunctivae normal.     Pupils: Pupils are equal, round, and reactive to light.  Cardiovascular:     Rate and Rhythm: Normal rate and regular rhythm.     Heart sounds: No murmur heard.    No gallop.  Pulmonary:     Effort: Pulmonary effort is normal. No respiratory distress.     Breath sounds: No wheezing, rhonchi or  rales.  Abdominal:     General: Bowel sounds are normal. There is no distension.     Palpations: Abdomen is soft.     Tenderness: There is no abdominal tenderness. There is no guarding.  Musculoskeletal:     Right lower leg: No edema.     Left lower leg: No edema.  Skin:    General: Skin is warm and dry.  Neurological:     General: No focal deficit present.     Mental Status: She is alert and oriented to person, place, and time. Mental status is at baseline.  Psychiatric:        Mood and Affect: Mood normal.        Behavior: Behavior normal.       Data Reviewed:    Latest Ref Rng & Units 12/15/2023    4:48 AM 12/14/2023    3:23 PM 09/24/2022    8:56 AM  CBC  WBC 4.0 - 10.5 K/uL 8.8  9.3  6.4   Hemoglobin 12.0 - 15.0 g/dL 60.4  54.0  98.1   Hematocrit 36.0 - 46.0 % 36.0  36.8  39.1   Platelets 150 - 400 K/uL 103  90  302        Latest Ref Rng & Units 12/15/2023    4:48 AM 09/24/2022    8:56 AM 02/28/2007    5:43 PM  BMP  Glucose 70 - 99 mg/dL 191  478  295   BUN 6 - 20 mg/dL 9  11  10    Creatinine 0.44 - 1.00 mg/dL 6.21  3.08  6.57   Sodium 135 - 145 mmol/L 143  137  142   Potassium 3.5 - 5.1 mmol/L 3.1  3.7  3.7   Chloride 98 - 111 mmol/L 107  106  114   CO2 22 - 32 mmol/L 28  23  21    Calcium 8.9 - 10.3 mg/dL 8.9  9.0  9.3      Vitals:   12/15/23 0351 12/15/23 0827 12/15/23 0827 12/15/23 1535  BP: 131/85 (!) 128/91 (!) 128/91 126/85  Pulse: 74 91 90 82  Resp: 20 14 14 18   Temp: 98.4 F (36.9 C) 98.6 F (37 C) 98.6 F (37 C) 98.3 F (36.8 C)  TempSrc: Oral Oral Oral   SpO2: 100% 99% 99% 97%  Weight:      Height:        Family Communication: None at bedside  Disposition: Home  Time spent: 55 minutes  Author: Ezzard Holms, MD 12/15/2023 3:46 PM  For on call review [email or web address].

## 2023-12-15 NOTE — TOC CM/SW Note (Signed)
 Transition of Care [facility]) - Inpatient Brief Assessment   Patient Details  Name: CHELSIE BUREL MRN: [id number] Date of Birth: 1981/02/28  Transition of [facility]) CM/SW Contact:    Loman Risk, RN Phone Number: 12/15/2023, 11:27 AM   Clinical Narrative:  Transition of Care (TOC) Screening Note   Patient Details  Name: KIMBERLIE CSASZAR Date of Birth: 15-Sep-1980   Transition of Care [facility]) CM/SW Contact:    Loman Risk, RN Phone Number: 12/15/2023, 11:27 AM    Transition of Care Department [facility] - ) has reviewed patient and no TOC needs have been identified at this time.  If new patient transition needs arise, please place a TOC consult.     Transition of Care Asessment: Insurance and Status: Insurance coverage has been reviewed Patient has primary care physician: Yes     Prior/Current Home Services: No current home services Social Drivers of Health Review: SDOH reviewed no interventions necessary Readmission risk has been reviewed: Yes Transition of care needs: no transition of care needs at this time [

## 2023-12-15 NOTE — Plan of Care (Signed)

## 2023-12-15 NOTE — Plan of Care (Signed)
  Problem: Clinical Measurements: Goal: Will remain free from infection Outcome: Progressing   Problem: Clinical Measurements: Goal: Diagnostic test results will improve Outcome: Progressing   Problem: Clinical Measurements: Goal: Respiratory complications will improve Outcome: Progressing   Problem: Activity: Goal: Risk for activity intolerance will decrease Outcome: Progressing   Problem: Nutrition: Goal: Adequate nutrition will be maintained Outcome: Progressing   Problem: Coping: Goal: Level of anxiety will decrease Outcome: Progressing   

## 2023-12-16 ENCOUNTER — Telehealth (HOSPITAL_COMMUNITY): Payer: Self-pay | Admitting: Pharmacy Technician

## 2023-12-16 DIAGNOSIS — I2699 Other pulmonary embolism without acute cor pulmonale: Secondary | ICD-10-CM | POA: Diagnosis not present

## 2023-12-16 LAB — HOMOCYSTEINE: Homocysteine: 32.3 umol/L — ABNORMAL HIGH (ref 0.0–14.5)

## 2023-12-16 LAB — BETA-2-GLYCOPROTEIN I ABS, IGG/M/A
Beta-2 Glyco I IgG: <9 GPI IgG units (ref 0–20)
Beta-2-Glycoprotein I IgA: <9 GPI IgA units (ref 0–25)
Beta-2-Glycoprotein I IgM: <9 GPI IgM units (ref 0–32)

## 2023-12-16 LAB — CBC
HCT: 38.2 % (ref 36.0–46.0)
Hemoglobin: 12.6 g/dL (ref 12.0–15.0)
MCH: 30 pg (ref 26.0–34.0)
MCHC: 33 g/dL (ref 30.0–36.0)
MCV: 91 fL (ref 80.0–100.0)
Platelets: 121 10*3/uL — ABNORMAL LOW (ref 150–400)
RBC: 4.2 MIL/uL (ref 3.87–5.11)
RDW: 14 % (ref 11.5–15.5)
WBC: 9.4 10*3/uL (ref 4.0–10.5)
nRBC: 0 % (ref 0.0–0.2)

## 2023-12-16 LAB — PROTEIN C ACTIVITY: Protein C Activity: 101 % (ref 73–180)

## 2023-12-16 LAB — PROTEIN S, TOTAL: Protein S Ag, Total: 121 % (ref 60–150)

## 2023-12-16 LAB — LUPUS ANTICOAGULANT PANEL
DRVVT: 46.5 s (ref 0.0–47.0)
PTT Lupus Anticoagulant: 31.5 s (ref 0.0–43.5)

## 2023-12-16 LAB — PROTEIN S ACTIVITY: Protein S Activity: 57 % — ABNORMAL LOW (ref 63–140)

## 2023-12-16 MED ORDER — POLYETHYLENE GLYCOL 3350 17 G PO PACK: 17.0000 g | PACK | Freq: Every day | ORAL | 0 refills | Status: AC | PRN

## 2023-12-16 MED ORDER — TRAMADOL HCL 50 MG PO TABS: 50.0000 mg | ORAL_TABLET | Freq: Four times a day (QID) | ORAL | 0 refills | Status: AC | PRN

## 2023-12-16 MED ORDER — ACETAMINOPHEN 500 MG PO TABS: 1000.0000 mg | ORAL_TABLET | Freq: Three times a day (TID) | ORAL | 0 refills | Status: AC

## 2023-12-16 MED ORDER — APIXABAN 5 MG PO TABS: ORAL_TABLET | ORAL | 4 refills | Status: DC

## 2023-12-16 MED ORDER — LIDOCAINE 5 % EX PTCH: 1.0000 | MEDICATED_PATCH | CUTANEOUS | 0 refills | Status: AC

## 2023-12-16 NOTE — Plan of Care (Signed)

## 2023-12-16 NOTE — Discharge Summary (Signed)
 Physician Discharge Summary   Long: [name] MRN: [id number] DOB: May 16, 1981  Admit date:     12/14/2023  Discharge date: 12/16/23  Discharge Physician: Ezzard Holms   PCP: Lyle San, MD   Recommendations at discharge:  Follow-up with PCP Coagulation profile pending  Discharge Diagnoses: Acute pulmonary embolism (HCC) Thrombocytopenia (HCC) Lung nodule Postherpetic neuralgia Essential hypertension    Hospital Course: [name] is a 43 y.o. female with medical history significant of Migraines, trigeminal neuralgia, hypertension, who presents to the ED due to chest pain.   [name] states that she awoke in the middle of the night early on June 2 with left-sided chest pain, sweating, and chills.  She was worried that she was coming down with pneumonia.  She notes that since then, her left-sided chest pain has continued to worsen.  Chest pain is exacerbated by deep breathing.  She denies any shortness of breath at rest.  Otherwise, she denies any recent travel, illness, surgery.  She denies any nausea, vomiting, diarrhea, abdominal pain.  She does that approximately 1 month ago, she had an episode of right lower leg pain with swelling that came and went.   Both parents had history of DVTs, with her mother having DVT prior to the age of 35, and her father sometime before the age of 38.   ED course: On arrival to the ED, Long was hypertensive at 143/90 with heart rate of 94.  She was saturating at 97% on room air.  She was afebrile at 98.6.  Initial workup notable for platelets of 90 and negative troponin.  CTA with acute PE in the left lower lobe with moderate clot burden but no evidence of RV strain.  Small densities noted concerning for infarct.  [facility] contacted for admission. Coagulation profile requested and currently pending.  Long has been counseled to follow-up with PCP as well as to have results followed up.  Her neurologist has recommended discontinuing  carbamazepine to allow for Eliquis dosing.  Consultants: None Procedures performed: None Disposition: Home Diet recommendation:  Cardiac diet DISCHARGE MEDICATION: Allergies as of 12/16/2023       Reactions   Penicillin G Other (See Comments)   Yeast-derived Drug Products Other (See Comments)   States she avoids yeast products as they cause her yeast infections   Penicillins Other (See Comments)   UNKNOWN - AS AN INFANT        Medication List     STOP taking these medications    carbamazepine 200 MG tablet Commonly known as: TEGRETOL   meloxicam  15 MG tablet Commonly known as: MOBIC    pregabalin 150 MG capsule Commonly known as: LYRICA       TAKE these medications    acetaminophen  500 MG tablet Commonly known as: TYLENOL  Take 2 tablets (1,000 mg total) by mouth every 8 (eight) hours.   albuterol 108 (90 Base) MCG/ACT inhaler Commonly known as: VENTOLIN HFA Inhale 2 puffs into the lungs every 6 (six) hours as needed for wheezing or shortness of breath.   apixaban 5 MG Tabs tablet Commonly known as: ELIQUIS Take 2 tablets (10 mg total) by mouth 2 (two) times daily for 7 days, THEN 1 tablet (5 mg total) 2 (two) times daily. Start taking on: December 16, 2023   budesonide-formoterol 160-4.5 MCG/ACT inhaler Commonly known as: SYMBICORT Inhale 2 puffs into the lungs.   cholecalciferol 25 MCG (1000 UNIT) tablet Commonly known as: VITAMIN D3 Take 1,000 Units by mouth daily.  cyclobenzaprine 10 MG tablet Commonly known as: FLEXERIL Take 10 mg by mouth as needed for muscle spasms.   diclofenac Sodium 1 % Gel Commonly known as: VOLTAREN APPLY 2 TO 4 GRAMS AA BID   folic acid 1 MG tablet Commonly known as: FOLVITE Take 1 mg by mouth daily.   gabapentin 300 MG capsule Commonly known as: NEURONTIN Take 600 mg by mouth 2 (two) times daily.   hydrochlorothiazide 12.5 MG tablet Commonly known as: HYDRODIURIL Take 12.5 mg by mouth daily.   lidocaine  5  % Commonly known as: LIDODERM  Place 1 patch onto the skin daily. Remove & Discard patch within 12 hours or as directed by MD   MAGNESIUM GLYCINATE PO Take 2 capsules by mouth at bedtime. Pt unsure of mg dose   metoprolol succinate 50 MG 24 hr tablet Commonly known as: TOPROL-XL Take 1 tablet by mouth daily.   pantoprazole 40 MG tablet Commonly known as: PROTONIX Take 40 mg by mouth 2 (two) times daily before a meal.   polyethylene glycol 17 g packet Commonly known as: MIRALAX / GLYCOLAX Take 17 g by mouth daily as needed for mild constipation.   sertraline  100 MG tablet Commonly known as: ZOLOFT  Take 1 tablet (100 mg total) by mouth daily.   traMADol  50 MG tablet Commonly known as: ULTRAM  Take 1 tablet (50 mg total) by mouth every 6 (six) hours as needed.   valACYclovir 1000 MG tablet Commonly known as: VALTREX Take 1,000 mg by mouth 2 (two) times daily.        Discharge Exam: Filed Weights   12/14/23 1643  Weight: 98.1 kg     General: She is not in acute distress.    Appearance: She is normal weight. She is not toxic-appearing.  HENT:     Head: Atraumatic.     Mouth/Throat:     Mouth: Mucous membranes are moist.     Pharynx: Oropharynx is clear.  Eyes:     Conjunctiva/sclera: Conjunctivae normal.     Pupils: Pupils are equal, round, and reactive to light.  Cardiovascular:     Rate and Rhythm: Normal rate and regular rhythm.     Heart sounds: No murmur heard.    No gallop.  Pulmonary:     Effort: Pulmonary effort is normal. No respiratory distress.     Breath sounds: No wheezing, rhonchi or rales.  Abdominal:     General: Bowel sounds are normal. There is no distension.     Palpations: Abdomen is soft.     Tenderness: There is no abdominal tenderness. There is no guarding.  Musculoskeletal:     Right lower leg: No edema.     Left lower leg: No edema.  Skin:    General: Skin is warm and dry.  Neurological:   Condition at discharge: good  The  results of significant diagnostics from this hospitalization (including imaging, microbiology, ancillary and laboratory) are listed below for reference.   Imaging Studies: US  Venous Img Lower Bilateral (DVT) Result Date: 12/14/2023 CLINICAL DATA:  Acute PE. EXAM: BILATERAL LOWER EXTREMITY VENOUS DOPPLER ULTRASOUND TECHNIQUE: Gray-scale sonography with graded compression, as well as color Doppler and duplex ultrasound were performed to evaluate the lower extremity deep venous systems from the level of the common femoral vein and including the common femoral, femoral, profunda femoral, popliteal and calf veins including the posterior tibial, peroneal and gastrocnemius veins when visible. The superficial great saphenous vein was also interrogated. Spectral Doppler was utilized to evaluate flow at  rest and with distal augmentation maneuvers in the common femoral, femoral and popliteal veins. COMPARISON:  None Available. FINDINGS: RIGHT LOWER EXTREMITY Common Femoral Vein: No evidence of thrombus. Normal compressibility, respiratory phasicity and response to augmentation. Saphenofemoral Junction: No evidence of thrombus. Normal compressibility and flow on color Doppler imaging. Profunda Femoral Vein: No evidence of thrombus. Normal compressibility and flow on color Doppler imaging. Femoral Vein: No evidence of thrombus. Normal compressibility, respiratory phasicity and response to augmentation. Popliteal Vein: No evidence of thrombus. Normal compressibility, respiratory phasicity and response to augmentation. Calf Veins: No evidence of thrombus. Normal compressibility and flow on color Doppler imaging. Superficial Great Saphenous Vein: No evidence of thrombus. Normal compressibility. Venous Reflux:  None. Other Findings:  None. LEFT LOWER EXTREMITY Common Femoral Vein: No evidence of thrombus. Normal compressibility, respiratory phasicity and response to augmentation. Saphenofemoral Junction: No evidence of thrombus.  Normal compressibility and flow on color Doppler imaging. Profunda Femoral Vein: No evidence of thrombus. Normal compressibility and flow on color Doppler imaging. Femoral Vein: No evidence of thrombus. Normal compressibility, respiratory phasicity and response to augmentation. Popliteal Vein: No evidence of thrombus. Normal compressibility, respiratory phasicity and response to augmentation. Calf Veins: No evidence of thrombus. Normal compressibility and flow on color Doppler imaging. Superficial Great Saphenous Vein: No evidence of thrombus. Normal compressibility. Venous Reflux:  None. Other Findings:  None. IMPRESSION: No evidence of deep venous thrombosis in either lower extremity. Electronically Signed   By: Tyron Gallon M.D.   On: 12/14/2023 18:37   CT Angio Chest PE W/Cm &/Or Wo Cm Addendum Date: 12/14/2023 ADDENDUM REPORT: 12/14/2023 16:42 ADDENDUM: Findings conveyed toTING TAN on 12/14/2023  at16:30 Electronically Signed   By: Deboraha Fallow M.D.   On: 12/14/2023 16:42   Result Date: 12/14/2023 CLINICAL DATA:  In 10 elevated D-dimer. Concern for pulmonary embolism. EXAM: CT ANGIOGRAPHY CHEST WITH CONTRAST TECHNIQUE: Multidetector CT imaging of the chest was performed using the standard protocol during bolus administration of intravenous contrast. Multiplanar CT image reconstructions and MIPs were obtained to evaluate the vascular anatomy. RADIATION DOSE REDUCTION: This exam was performed according to the departmental dose-optimization program which includes automated exposure control, adjustment of the mA and/or kV according to Long size and/or use of iterative reconstruction technique. CONTRAST:  75mL OMNIPAQUE IOHEXOL 350 MG/ML SOLN COMPARISON:  None Available. FINDINGS: Cardiovascular: Tubular filling defects within the proximal LEFT lower lobe segmental pulmonary arteries (images 51 through 66 series 4). Findings consistent with acute pulmonary thromboemboli. No evidence emboli within the RIGHT  lung. No evidence of RIGHT ventricular strain on CT imaging. Overall clot burden is moderate. Mediastinum/Nodes: No axillary or supraclavicular adenopathy. No mediastinal or hilar adenopathy. No pericardial fluid. Esophagus normal. Lungs/Pleura: Several peripheral densities in the LEFT lower lobe could represent small infarctions (image 72 and 82 of series 5). No pneumonia. No pneumothorax. In the RIGHT upper lobe, rounded pulmonary nodule measures 9 mm (image 51/series 5). Upper Abdomen: Limited view of the liver, kidneys, pancreas are unremarkable. Normal adrenal glands. Musculoskeletal: No aggressive osseous lesion. Review of the MIP images confirms the above findings. IMPRESSION: 1. Acute pulmonary thromboemboli in the LEFT lower lobe segmental pulmonary arteries. Moderate clot burden. No evidence of RIGHT ventricular strain. 2. Small peripheral densities in the LEFT lower lobe could represent small infarctions. 3. Right solid pulmonary nodule within the upper lobe measuring 9 mm. Per Fleischner Society Guidelines, consider a non-contrast Chest CT at 3 months, a PET/CT, or tissue sampling. These guidelines do not apply to immunocompromised  patients and patients with cancer. Follow up in patients with significant comorbidities as clinically warranted. For lung cancer screening, adhere to Lung-RADS guidelines. Reference: Radiology. 2017; 284(1):228-43. Electronically Signed: By: Deboraha Fallow M.D. On: 12/14/2023 16:27    Microbiology: No results found for this or any previous visit.  Labs: CBC: Recent Labs  Lab 12/14/23 1523 12/15/23 0448 12/16/23 0452  WBC 9.3 8.8 9.4  NEUTROABS 8.4* 6.5  --   HGB 12.5 12.3 12.6  HCT 36.8 36.0 38.2  MCV 89.1 90.7 91.0  PLT 90* 103* 121*   Basic Metabolic Panel: Recent Labs  Lab 12/15/23 0448  NA 143  K 3.1*  CL 107  CO2 28  GLUCOSE 103*  BUN 9  CREATININE 0.57  CALCIUM 8.9   Liver Function Tests: No results for input(s): "AST", "ALT",  "ALKPHOS", "BILITOT", "PROT", "ALBUMIN" in the last 168 hours. CBG: No results for input(s): "GLUCAP" in the last 168 hours.  Discharge time spent:  36 minutes.  Signed: Ezzard Holms, MD [facility] 12/16/2023

## 2023-12-16 NOTE — Telephone Encounter (Signed)
 Pharmacy Patient Advocate Encounter   Received notification from Fax that prior authorization for Lidocaine  5% patches is required/requested.   Insurance verification completed.   The patient is insured through [employer] [place]  .   Per test claim: PA required; PA submitted to above mentioned insurance via CoverMyMeds Key/confirmation #/EOC [id number] Status is pending

## 2023-12-17 LAB — CARDIOLIPIN ANTIBODIES, IGG, IGM, IGA
Anticardiolipin IgA: <9 U/mL (ref 0–11)
Anticardiolipin IgG: <9 GPL U/mL (ref 0–14)
Anticardiolipin IgM: 15 [MPL'U]/mL — ABNORMAL HIGH (ref 0–12)

## 2023-12-17 LAB — PROTEIN C, TOTAL: Protein C, Total: 98 % (ref 60–150)

## 2023-12-19 LAB — FACTOR 5 LEIDEN

## 2023-12-19 NOTE — Telephone Encounter (Signed)
 Pharmacy Patient Advocate Encounter  Received notification from [facility] [place]  that Prior Authorization for Lidocaine  5% patches  has been DENIED.  Full denial letter will be uploaded to the media tab. See denial reason below.   PA #/Case ID/Reference #: [id number]

## 2023-12-21 LAB — PROTHROMBIN GENE MUTATION

## 2024-01-05 ENCOUNTER — Inpatient Hospital Stay

## 2024-01-05 ENCOUNTER — Encounter: Payer: Self-pay | Admitting: Oncology

## 2024-01-05 ENCOUNTER — Inpatient Hospital Stay: Attending: Oncology | Admitting: Oncology

## 2024-01-05 VITALS — BP 129/90 | HR 70 | Temp 98.8°F | Resp 19 | Wt 210.9 lb

## 2024-01-05 DIAGNOSIS — M129 Arthropathy, unspecified: Secondary | ICD-10-CM | POA: Insufficient documentation

## 2024-01-05 DIAGNOSIS — D696 Thrombocytopenia, unspecified: Secondary | ICD-10-CM

## 2024-01-05 DIAGNOSIS — Z86718 Personal history of other venous thrombosis and embolism: Secondary | ICD-10-CM | POA: Diagnosis not present

## 2024-01-05 DIAGNOSIS — K219 Gastro-esophageal reflux disease without esophagitis: Secondary | ICD-10-CM | POA: Diagnosis not present

## 2024-01-05 DIAGNOSIS — Z7951 Long term (current) use of inhaled steroids: Secondary | ICD-10-CM | POA: Diagnosis not present

## 2024-01-05 DIAGNOSIS — Z86711 Personal history of pulmonary embolism: Secondary | ICD-10-CM | POA: Diagnosis present

## 2024-01-05 DIAGNOSIS — I10 Essential (primary) hypertension: Secondary | ICD-10-CM | POA: Insufficient documentation

## 2024-01-05 DIAGNOSIS — Z87891 Personal history of nicotine dependence: Secondary | ICD-10-CM | POA: Insufficient documentation

## 2024-01-05 DIAGNOSIS — R911 Solitary pulmonary nodule: Secondary | ICD-10-CM | POA: Diagnosis not present

## 2024-01-05 DIAGNOSIS — Z79624 Long term (current) use of inhibitors of nucleotide synthesis: Secondary | ICD-10-CM | POA: Diagnosis not present

## 2024-01-05 DIAGNOSIS — Z7901 Long term (current) use of anticoagulants: Secondary | ICD-10-CM | POA: Diagnosis not present

## 2024-01-05 DIAGNOSIS — F17210 Nicotine dependence, cigarettes, uncomplicated: Secondary | ICD-10-CM | POA: Diagnosis not present

## 2024-01-05 DIAGNOSIS — Z79899 Other long term (current) drug therapy: Secondary | ICD-10-CM | POA: Insufficient documentation

## 2024-01-05 DIAGNOSIS — Z791 Long term (current) use of non-steroidal anti-inflammatories (NSAID): Secondary | ICD-10-CM | POA: Insufficient documentation

## 2024-01-05 DIAGNOSIS — I2699 Other pulmonary embolism without acute cor pulmonale: Secondary | ICD-10-CM

## 2024-01-05 DIAGNOSIS — Z800 Family history of malignant neoplasm of digestive organs: Secondary | ICD-10-CM | POA: Diagnosis not present

## 2024-01-05 LAB — CBC WITH DIFFERENTIAL/PLATELET
Abs Immature Granulocytes: 0.02 10*3/uL (ref 0.00–0.07)
Basophils Absolute: 0 10*3/uL (ref 0.0–0.1)
Basophils Relative: 0 %
Eosinophils Absolute: 0 10*3/uL (ref 0.0–0.5)
Eosinophils Relative: 1 %
HCT: 37.5 % (ref 36.0–46.0)
Hemoglobin: 12.6 g/dL (ref 12.0–15.0)
Immature Granulocytes: 0 %
Lymphocytes Relative: 35 %
Lymphs Abs: 1.9 10*3/uL (ref 0.7–4.0)
MCH: 29.7 pg (ref 26.0–34.0)
MCHC: 33.6 g/dL (ref 30.0–36.0)
MCV: 88.4 fL (ref 80.0–100.0)
Monocytes Absolute: 0.3 10*3/uL (ref 0.1–1.0)
Monocytes Relative: 6 %
Neutro Abs: 3 10*3/uL (ref 1.7–7.7)
Neutrophils Relative %: 58 %
Platelets: 103 10*3/uL — ABNORMAL LOW (ref 150–400)
RBC: 4.24 MIL/uL (ref 3.87–5.11)
RDW: 13.4 % (ref 11.5–15.5)
Smear Review: NORMAL
WBC: 5.3 10*3/uL (ref 4.0–10.5)
nRBC: 0 % (ref 0.0–0.2)

## 2024-01-05 LAB — HEPATITIS PANEL, ACUTE
HCV Ab: NONREACTIVE
Hep A IgM: NONREACTIVE
Hep B C IgM: NONREACTIVE
Hepatitis B Surface Ag: NONREACTIVE

## 2024-01-05 LAB — LACTATE DEHYDROGENASE: LDH: 122 U/L (ref 98–192)

## 2024-01-05 LAB — FOLATE: Folate: 27 ng/mL (ref >5.9)

## 2024-01-05 LAB — IMMATURE PLATELET FRACTION: Immature Platelet Fraction: 7.6 % (ref 1.2–8.6)

## 2024-01-05 LAB — VITAMIN B12: Vitamin B-12: 381 pg/mL (ref 180–914)

## 2024-01-05 NOTE — Assessment & Plan Note (Signed)
 Check hepatitis panel, CBC, smear, SPEP, light chain ratio, flow cytometry B12 and folate.

## 2024-01-05 NOTE — Progress Notes (Signed)
 Hematology/Oncology Consult note [phone] [phone]        REFERRING PROVIDER: Valora Agent, MD   CHIEF COMPLAINTS/REASON FOR VISIT:  Evaluation of acute pulmonary embolism  ASSESSMENT & PLAN:   Acute pulmonary embolism (HCC) Unprovoked acute pulmonary embolism. I recommend patient to continue anticoagulation with Eliquis  5 mg twice daily for 6 months.  Followed by low-dose anticoagulation prophylaxis long-term.  Hypercoagulable workup results were reviewed with patient. Anticardiolipin IgM 15, no clinical significance. Decreased protein S activity, recommend patient to repeat values in 3 months. Elevated homocystine level, this could be secondary to vitamin deficiency, MTHFR mutation etc.  I recommend not to check MTHFR mutation as manipulating homocystine level does not lower thrombosis risk.   Thrombocytopenia (HCC) Check hepatitis panel, CBC, smear, SPEP, light chain ratio, flow cytometry B12 and folate.  Nodule of right lung Recommend repeat CT in 3 months.   Orders Placed This Encounter  Procedures   CT Angio Chest Pulmonary Embolism (PE) W or WO Contrast    Standing Status:   Future    Expected Date:   04/06/2024    Expiration Date:   01/04/2025    If indicated for the ordered procedure, I authorize the administration of contrast media per Radiology protocol:   Yes    Does the patient have a contrast media/X-ray dye allergy?:   No    Is patient pregnant?:   No    Preferred imaging location?:   [facility]   Vitamin B12    Standing Status:   Future    Number of Occurrences:   1    Expected Date:   01/05/2024    Expiration Date:   04/04/2024   Folate    Standing Status:   Future    Number of Occurrences:   1    Expected Date:   01/05/2024    Expiration Date:   04/04/2024   CBC with Differential/Platelet    Standing Status:   Future    Number of Occurrences:   1    Expected Date:   01/05/2024    Expiration Date:   04/04/2024    Immature Platelet Fraction    Standing Status:   Future    Number of Occurrences:   1    Expected Date:   01/05/2024    Expiration Date:   04/04/2024   Protein electrophoresis, serum    Standing Status:   Future    Number of Occurrences:   1    Expected Date:   01/05/2024    Expiration Date:   04/04/2024   Flow cytometry panel-leukemia/lymphoma work-up    Standing Status:   Future    Number of Occurrences:   1    Expected Date:   01/05/2024    Expiration Date:   04/04/2024   Lactate dehydrogenase    Standing Status:   Future    Number of Occurrences:   1    Expected Date:   01/05/2024    Expiration Date:   04/04/2024   Hepatitis panel, acute    Standing Status:   Future    Number of Occurrences:   1    Expected Date:   01/05/2024    Expiration Date:   04/04/2024   Follow-up in 3 months. All questions were answered. The patient knows to call the clinic with any problems, questions or concerns.  [name], MD, PhD [facility] Hematology Oncology 01/05/2024   HISTORY OF PRESENTING ILLNESS:   [name] is a  43 y.o.  female with PMH listed below was seen in consultation at the request of  Valora Agent, MD  for evaluation of acute unprovoked pulmonary embolism.  12/14/2023 - 12/16/2023, patient was hospitalized due to left chest pain, sweating and chills.  He went to emergency room for evaluation.  12/14/2023, CT angiogram chest PE protocol showed acute pulmonary emboli in the left lower lobe segmental pulmonary arteries, moderate clot burden.  No evidence of right heart strain.  Small pleural flow densities in the left lower lobe could represent small infarctions.  Right lung solid nodule within the upper lobe measuring 9 mm.  Patient is someday smoker, quit recently after this admission.  Bilateral lower extremity venous ultrasound is negative for DVT  Patient was admitted and started on anticoagulation.  She was discharged on Eliquis  10 mg twice daily for 7 days followed by 5 mg twice  daily. Currently she is on 5 mg twice daily.  She tolerates well.  Denies any bleeding events.  He denies any triggering immobilization factors for this event.  This is her first thrombosis event.  Family history of DVT. Since started on anticoagulation, chest pain has subsided.  No shortness of breath.  During this hospitalization, hypercoagulable workup was obtained. Patient has negative factor V Leiden mutation, prothrombin gene mutation. Slightly elevated cardiolipin IgM 15, normal IgG level.  Negative beta-2  glycoprotein.  Negative lupus anticoagulant. Patient has slightly decreased protein S activity.  Normal protein C antigen activity.  Increased homocystine level at 32.3.   MEDICAL HISTORY:  Past Medical History:  Diagnosis Date   Anxiety    Arthritis    right ankle   Bony exostosis 09/2014   right knee   GERD (gastroesophageal reflux disease)    History of migraine    Hypertension    Neuromuscular disorder (HCC)    trigeminal nerve pain   Pain, eye, left 10/10/2014   with tingling - states possible shingles; to see PCP   Painful orthopaedic hardware (HCC) 09/2014   right ankle    SURGICAL HISTORY: Past Surgical History:  Procedure Laterality Date   BONE EXOSTOSIS EXCISION Right 10/16/2014   Procedure: EXCISION OF BONY EXOSTOSIS FROM RIGHT KNEE;  Surgeon: Kay CHRISTELLA Cummins, MD;  Location: [facility];  Service: Orthopedics;  Laterality: Right;   CHOLECYSTECTOMY  07/12/2006   DILATATION & CURRETTAGE/HYSTEROSCOPY WITH RESECTOCOPE N/A 09/24/2022   Procedure: DILATATION & CURETTAGE/HYSTEROSCOPY WITH RESECTOCOPE;  Surgeon: Rutherford Gain, MD;  Location: [facility];  Service: Gynecology;  Laterality: N/A;   ENDOMETRIAL ABLATION N/A 09/24/2022   Procedure: ENDOMETRIAL ABLATION;  Surgeon: Rutherford Gain, MD;  Location: [facility];  Service: Gynecology;  Laterality: N/A;   HARDWARE REMOVAL Right 10/16/2014   Procedure: HARDWARE  REMOVAL RIGHT ANKLE;  Surgeon: Kay CHRISTELLA Cummins, MD;  Location: [facility];  Service: Orthopedics;  Laterality: Right;   KNEE ARTHROSCOPY Right 09/01/2007   ORIF ANKLE FRACTURE BIMALLEOLAR Right 02/28/2007   ORIF TIBIA FRACTURE Right 02/28/2007   rod placement right   TIBIA HARDWARE REMOVAL Right 09/01/2007    SOCIAL HISTORY: Social History   Socioeconomic History   Marital status: Single    Spouse name: Not on file   Number of children: Not on file   Years of education: Not on file   Highest education level: Not on file  Occupational History   Not on file  Tobacco Use   Smoking status: Former    Current packs/day: 0.25    Types:  Cigarettes   Smokeless tobacco: Never   Tobacco comments:    Pt. States she occasionally will smoke not everyday  Vaping Use   Vaping status: Never Used  Substance and Sexual Activity   Alcohol use: Not Currently    Comment: occasionally   Drug use: No   Sexual activity: Not Currently    Birth control/protection: None  Other Topics Concern   Not on file  Social History Narrative   Not on file   Social Drivers of Health   Financial Resource Strain: Patient Declined (08/11/2023)   Received from [facility]   Overall Financial Resource Strain (CARDIA)    Difficulty of Paying Living Expenses: Patient declined  Food Insecurity: No Food Insecurity (01/05/2024)   Hunger Vital Sign    Worried About Running Out of Food in the Last Year: Never true    Ran Out of Food in the Last Year: Never true  Transportation Needs: No Transportation Needs (01/05/2024)   PRAPARE - Administrator, Civil Service (Medical): No    Lack of Transportation (Non-Medical): No  Physical Activity: Insufficiently Active (09/29/2017)   Exercise Vital Sign    Days of Exercise per Week: 2 days    Minutes of Exercise per Session: 30 min  Stress: Stress Concern Present (09/29/2017)   [employer] of Occupational Health - Occupational  Stress Questionnaire    Feeling of Stress : To some extent  Social Connections: Unknown (02/10/2022)   Received from [facility]   Social Network    Social Network: Not on file  Intimate Partner Violence: Not At Risk (01/05/2024)   Humiliation, Afraid, Rape, and Kick questionnaire    Fear of Current or Ex-Partner: No    Emotionally Abused: No    Physically Abused: No    Sexually Abused: No    FAMILY HISTORY: Family History  Problem Relation Age of Onset   Lupus Mother    Heart disease Father    Rheum arthritis Father    Myasthenia gravis Maternal Grandmother    Diabetes Paternal Grandmother    Breast cancer Paternal Grandmother 6   Colon cancer Paternal Uncle    Colon cancer Maternal Uncle     ALLERGIES:  is allergic to penicillin g, yeast-derived drug products, and penicillins.  MEDICATIONS:  Current Outpatient Medications  Medication Sig Dispense Refill   acetaminophen  (TYLENOL ) 500 MG tablet Take 2 tablets (1,000 mg total) by mouth every 8 (eight) hours. 30 tablet 0   albuterol (VENTOLIN HFA) 108 (90 Base) MCG/ACT inhaler Inhale 2 puffs into the lungs every 6 (six) hours as needed for wheezing or shortness of breath.     apixaban  (ELIQUIS ) 5 MG TABS tablet Take 2 tablets (10 mg total) by mouth 2 (two) times daily for 7 days, THEN 1 tablet (5 mg total) 2 (two) times daily. 88 tablet 4   cholecalciferol (VITAMIN D3) 25 MCG (1000 UNIT) tablet Take 1,000 Units by mouth daily.     cyclobenzaprine (FLEXERIL) 10 MG tablet Take 10 mg by mouth as needed for muscle spasms.     folic acid (FOLVITE) 1 MG tablet Take 1 mg by mouth daily.     gabapentin  (NEURONTIN ) 300 MG capsule Take 600 mg by mouth 2 (two) times daily.     hydrochlorothiazide  (HYDRODIURIL ) 12.5 MG tablet Take 12.5 mg by mouth daily.     MAGNESIUM GLYCINATE PO Take 2 capsules by mouth at bedtime. Pt unsure of mg dose     metoprolol  succinate (  TOPROL -XL) 50 MG 24 hr tablet Take 1 tablet by mouth daily.     pantoprazole   (PROTONIX ) 40 MG tablet Take 40 mg by mouth 2 (two) times daily before a meal.     polyethylene glycol (MIRALAX  / GLYCOLAX ) 17 g packet Take 17 g by mouth daily as needed for mild constipation. 14 each 0   sertraline  (ZOLOFT ) 100 MG tablet Take 1 tablet (100 mg total) by mouth daily. 30 tablet 11   traMADol  (ULTRAM ) 50 MG tablet Take 1 tablet (50 mg total) by mouth every 6 (six) hours as needed. 10 tablet 0   valACYclovir (VALTREX) 1000 MG tablet Take 1,000 mg by mouth 2 (two) times daily.     budesonide-formoterol (SYMBICORT) 160-4.5 MCG/ACT inhaler Inhale 2 puffs into the lungs. (Patient not taking: Reported on 01/05/2024)     diclofenac Sodium (VOLTAREN) 1 % GEL APPLY 2 TO 4 GRAMS AA BID (Patient not taking: Reported on 01/05/2024)     lidocaine  (LIDODERM ) 5 % Place 1 patch onto the skin daily. Remove & Discard patch within 12 hours or as directed by MD (Patient not taking: Reported on 01/05/2024) 10 patch 0   Current Facility-Administered Medications  Medication Dose Route Frequency Provider Last Rate Last Admin   betamethasone  acetate-betamethasone  sodium phosphate  (CELESTONE ) injection 3 mg  3 mg Intra-articular Once         Review of Systems  Constitutional:  Negative for appetite change, chills, fatigue and fever.  HENT:   Negative for hearing loss and voice change.   Eyes:  Negative for eye problems.  Respiratory:  Negative for chest tightness and cough.   Cardiovascular:  Negative for chest pain.  Gastrointestinal:  Negative for abdominal distention, abdominal pain and blood in stool.  Endocrine: Negative for hot flashes.  Genitourinary:  Negative for difficulty urinating and frequency.   Musculoskeletal:  Negative for arthralgias.  Skin:  Negative for itching and rash.  Neurological:  Negative for extremity weakness.  Hematological:  Negative for adenopathy.  Psychiatric/Behavioral:  Negative for confusion.    PHYSICAL EXAMINATION: ECOG PERFORMANCE STATUS: 0 -  Asymptomatic Vitals:   01/05/24 1529  BP: (!) 129/90  Pulse: 70  Resp: 19  Temp: 98.8 F (37.1 C)  SpO2: 98%   Filed Weights   01/05/24 1529  Weight: 210 lb 14.4 oz (95.7 kg)    Physical Exam Constitutional:      General: She is not in acute distress. HENT:     Head: Normocephalic and atraumatic.   Eyes:     General: No scleral icterus.   Cardiovascular:     Rate and Rhythm: Normal rate and regular rhythm.     Heart sounds: Normal heart sounds.  Pulmonary:     Effort: Pulmonary effort is normal. No respiratory distress.     Breath sounds: No wheezing.  Abdominal:     General: Bowel sounds are normal. There is no distension.     Palpations: Abdomen is soft.   Musculoskeletal:        General: No deformity. Normal range of motion.     Cervical back: Normal range of motion and neck supple.   Skin:    General: Skin is warm and dry.     Findings: No erythema or rash.   Neurological:     Mental Status: She is alert and oriented to person, place, and time. Mental status is at baseline.     Cranial Nerves: No cranial nerve deficit.     Coordination: Coordination normal.  Psychiatric:        Mood and Affect: Mood normal.     LABORATORY DATA:  I have reviewed the data as listed    Latest Ref Rng & Units 01/05/2024    4:07 PM 12/16/2023    4:52 AM 12/15/2023    4:48 AM  CBC  WBC 4.0 - 10.5 K/uL 5.3  9.4  8.8   Hemoglobin 12.0 - 15.0 g/dL 87.3  87.3  87.6   Hematocrit 36.0 - 46.0 % 37.5  38.2  36.0   Platelets 150 - 400 K/uL 103  121  103       Latest Ref Rng & Units 12/15/2023    4:48 AM 09/24/2022    8:56 AM 02/28/2007    5:43 PM  CMP  Glucose 70 - 99 mg/dL 896  895  899   BUN 6 - 20 mg/dL 9  11  10    Creatinine 0.44 - 1.00 mg/dL 9.42  9.34  9.27   Sodium 135 - 145 mmol/L 143  137  142   Potassium 3.5 - 5.1 mmol/L 3.1  3.7  3.7   Chloride 98 - 111 mmol/L 107  106  114   CO2 22 - 32 mmol/L 28  23  21    Calcium 8.9 - 10.3 mg/dL 8.9  9.0  9.3        RADIOGRAPHIC STUDIES: I have personally reviewed the radiological images as listed and agreed with the findings in the report. US  Venous Img Lower Bilateral (DVT) Result Date: 12/14/2023 CLINICAL DATA:  Acute PE. EXAM: BILATERAL LOWER EXTREMITY VENOUS DOPPLER ULTRASOUND TECHNIQUE: Gray-scale sonography with graded compression, as well as color Doppler and duplex ultrasound were performed to evaluate the lower extremity deep venous systems from the level of the common femoral vein and including the common femoral, femoral, profunda femoral, popliteal and calf veins including the posterior tibial, peroneal and gastrocnemius veins when visible. The superficial great saphenous vein was also interrogated. Spectral Doppler was utilized to evaluate flow at rest and with distal augmentation maneuvers in the common femoral, femoral and popliteal veins. COMPARISON:  None Available. FINDINGS: RIGHT LOWER EXTREMITY Common Femoral Vein: No evidence of thrombus. Normal compressibility, respiratory phasicity and response to augmentation. Saphenofemoral Junction: No evidence of thrombus. Normal compressibility and flow on color Doppler imaging. Profunda Femoral Vein: No evidence of thrombus. Normal compressibility and flow on color Doppler imaging. Femoral Vein: No evidence of thrombus. Normal compressibility, respiratory phasicity and response to augmentation. Popliteal Vein: No evidence of thrombus. Normal compressibility, respiratory phasicity and response to augmentation. Calf Veins: No evidence of thrombus. Normal compressibility and flow on color Doppler imaging. Superficial Great Saphenous Vein: No evidence of thrombus. Normal compressibility. Venous Reflux:  None. Other Findings:  None. LEFT LOWER EXTREMITY Common Femoral Vein: No evidence of thrombus. Normal compressibility, respiratory phasicity and response to augmentation. Saphenofemoral Junction: No evidence of thrombus. Normal compressibility and flow on  color Doppler imaging. Profunda Femoral Vein: No evidence of thrombus. Normal compressibility and flow on color Doppler imaging. Femoral Vein: No evidence of thrombus. Normal compressibility, respiratory phasicity and response to augmentation. Popliteal Vein: No evidence of thrombus. Normal compressibility, respiratory phasicity and response to augmentation. Calf Veins: No evidence of thrombus. Normal compressibility and flow on color Doppler imaging. Superficial Great Saphenous Vein: No evidence of thrombus. Normal compressibility. Venous Reflux:  None. Other Findings:  None. IMPRESSION: No evidence of deep venous thrombosis in either lower extremity. Electronically Signed   By: Greig Maple HERO.D.  On: 12/14/2023 18:37   CT Angio Chest PE W/Cm &/Or Wo Cm Addendum Date: 12/14/2023 ADDENDUM REPORT: 12/14/2023 16:42 ADDENDUM: Findings conveyed toTING TAN on 12/14/2023  at16:30 Electronically Signed   By: Jackquline Boxer M.D.   On: 12/14/2023 16:42   Result Date: 12/14/2023 CLINICAL DATA:  In 10 elevated D-dimer. Concern for pulmonary embolism. EXAM: CT ANGIOGRAPHY CHEST WITH CONTRAST TECHNIQUE: Multidetector CT imaging of the chest was performed using the standard protocol during bolus administration of intravenous contrast. Multiplanar CT image reconstructions and MIPs were obtained to evaluate the vascular anatomy. RADIATION DOSE REDUCTION: This exam was performed according to the departmental dose-optimization program which includes automated exposure control, adjustment of the mA and/or kV according to patient size and/or use of iterative reconstruction technique. CONTRAST:  75mL OMNIPAQUE  IOHEXOL  350 MG/ML SOLN COMPARISON:  None Available. FINDINGS: Cardiovascular: Tubular filling defects within the proximal LEFT lower lobe segmental pulmonary arteries (images 51 through 66 series 4). Findings consistent with acute pulmonary thromboemboli. No evidence emboli within the RIGHT lung. No evidence of RIGHT  ventricular strain on CT imaging. Overall clot burden is moderate. Mediastinum/Nodes: No axillary or supraclavicular adenopathy. No mediastinal or hilar adenopathy. No pericardial fluid. Esophagus normal. Lungs/Pleura: Several peripheral densities in the LEFT lower lobe could represent small infarctions (image 72 and 82 of series 5). No pneumonia. No pneumothorax. In the RIGHT upper lobe, rounded pulmonary nodule measures 9 mm (image 51/series 5). Upper Abdomen: Limited view of the liver, kidneys, pancreas are unremarkable. Normal adrenal glands. Musculoskeletal: No aggressive osseous lesion. Review of the MIP images confirms the above findings. IMPRESSION: 1. Acute pulmonary thromboemboli in the LEFT lower lobe segmental pulmonary arteries. Moderate clot burden. No evidence of RIGHT ventricular strain. 2. Small peripheral densities in the LEFT lower lobe could represent small infarctions. 3. Right solid pulmonary nodule within the upper lobe measuring 9 mm. Per Fleischner Society Guidelines, consider a non-contrast Chest CT at 3 months, a PET/CT, or tissue sampling. These guidelines do not apply to immunocompromised patients and patients with cancer. Follow up in patients with significant comorbidities as clinically warranted. For lung cancer screening, adhere to Lung-RADS guidelines. Reference: Radiology. 2017; 284(1):228-43. Electronically Signed: By: Jackquline Boxer M.D. On: 12/14/2023 16:27

## 2024-01-05 NOTE — Assessment & Plan Note (Signed)
 Recommend repeat CT in 3 months.

## 2024-01-05 NOTE — Assessment & Plan Note (Addendum)
 Unprovoked acute pulmonary embolism. I recommend patient to continue anticoagulation with Eliquis  5 mg twice daily for 6 months.  Followed by low-dose anticoagulation prophylaxis long-term. Repeat CT in 3 months.  Hypercoagulable workup results were reviewed with patient. Anticardiolipin IgM 15, no clinical significance. Decreased protein S activity, recommend patient to repeat values in 3 months. Elevated homocystine level, this could be secondary to vitamin deficiency, MTHFR mutation etc.  I recommend not to check MTHFR mutation as manipulating homocystine level does not lower thrombosis risk.

## 2024-01-09 LAB — PROTEIN ELECTROPHORESIS, SERUM
A/G Ratio: 1.2 (ref 0.7–1.7)
Albumin ELP: 3.8 g/dL (ref 2.9–4.4)
Alpha-1-Globulin: 0.2 g/dL (ref 0.0–0.4)
Alpha-2-Globulin: 0.6 g/dL (ref 0.4–1.0)
Beta Globulin: 1.1 g/dL (ref 0.7–1.3)
Gamma Globulin: 1.2 g/dL (ref 0.4–1.8)
Globulin, Total: 3.2 g/dL (ref 2.2–3.9)
Total Protein ELP: 7 g/dL (ref 6.0–8.5)

## 2024-01-11 ENCOUNTER — Other Ambulatory Visit: Payer: Self-pay | Admitting: Family Medicine

## 2024-01-11 DIAGNOSIS — Z1231 Encounter for screening mammogram for malignant neoplasm of breast: Secondary | ICD-10-CM

## 2024-01-11 LAB — COMP PANEL: LEUKEMIA/LYMPHOMA

## 2024-01-12 IMAGING — MG MM DIGITAL SCREENING BILAT W/ TOMO AND CAD
8 series · 9 of 24 positions shown · non-contrast
Comparison: Previous exam(s).

CLINICAL DATA: Screening.

EXAM:
DIGITAL SCREENING BILATERAL MAMMOGRAM WITH TOMOSYNTHESIS AND CAD
TECHNIQUE: Bilateral screening digital craniocaudal and mediolateral oblique
mammograms were obtained. Bilateral screening digital breast
tomosynthesis was performed. The images were evaluated with
computer-aided detection.

[L MLO synth-2D]
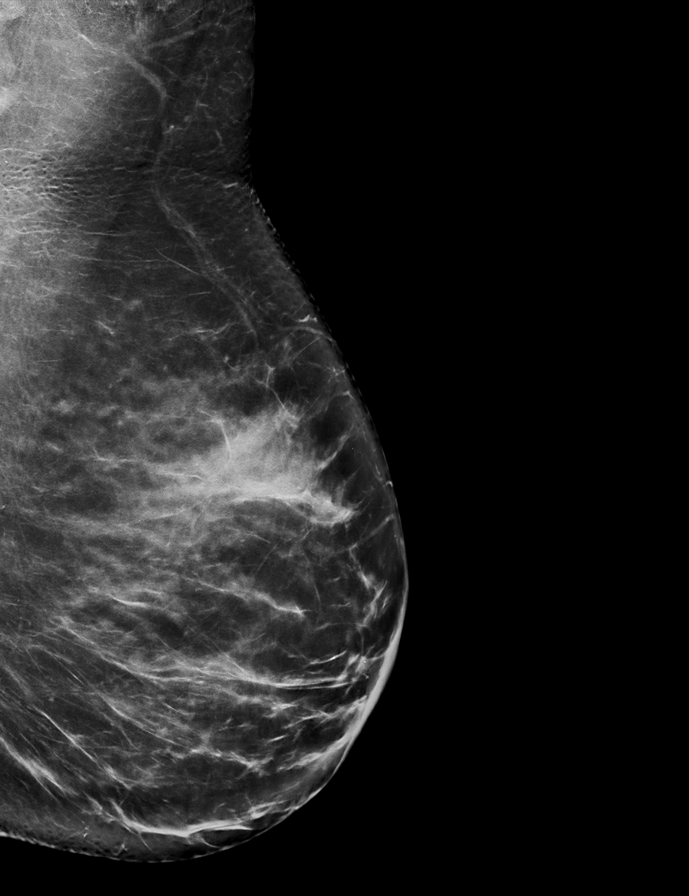

[L CC synth-2D]
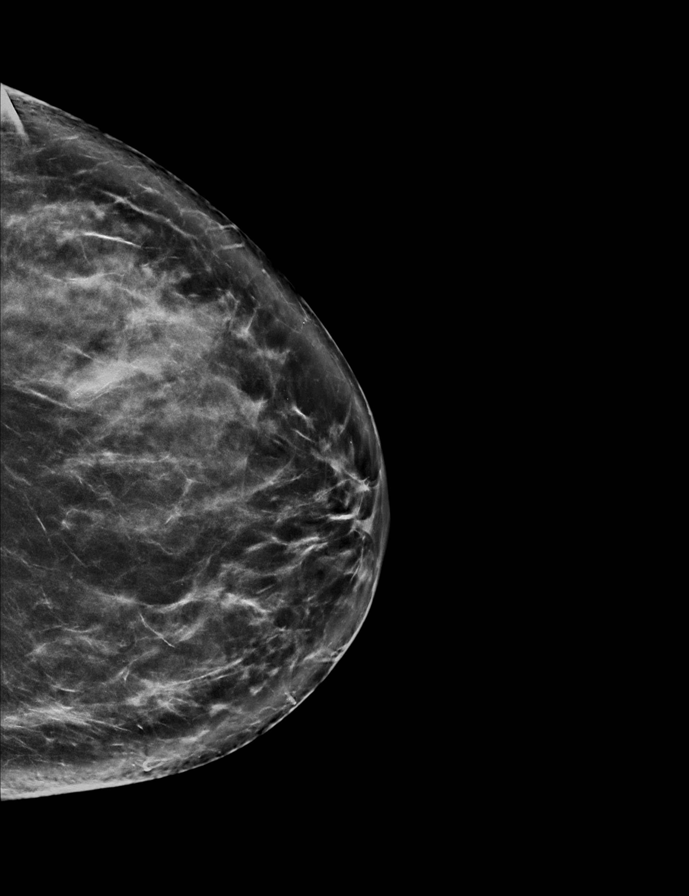

[R CC synth-2D]
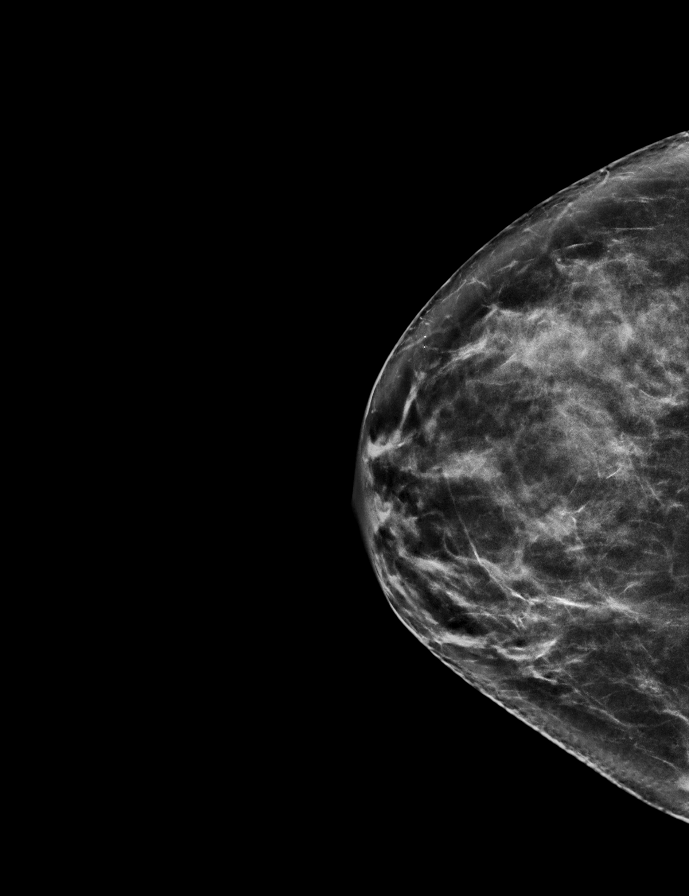

[R MLO synth-2D]
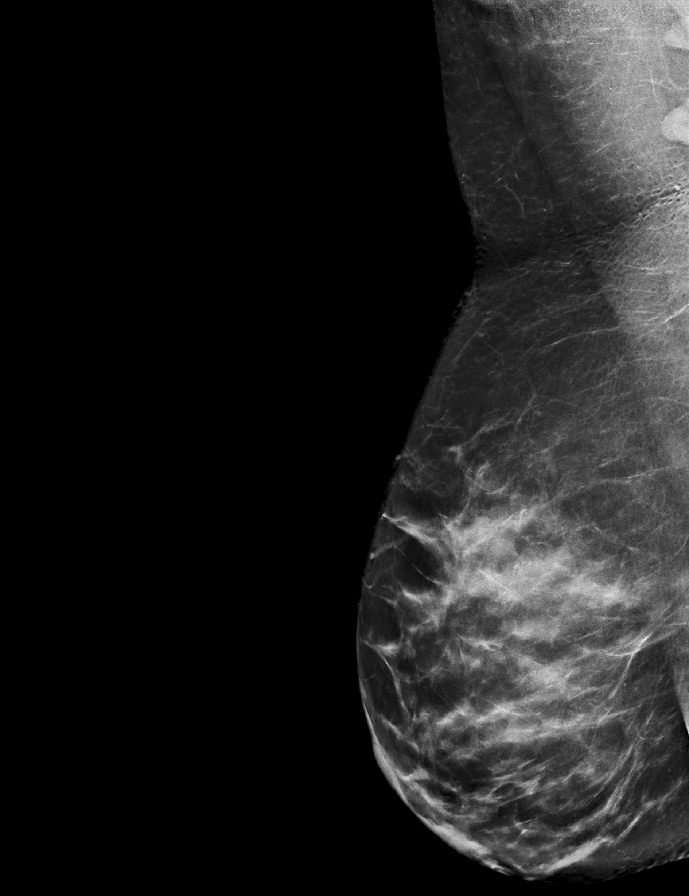

[L MLO tomo · 2 of 84 frames shown]
[frame 28/84]
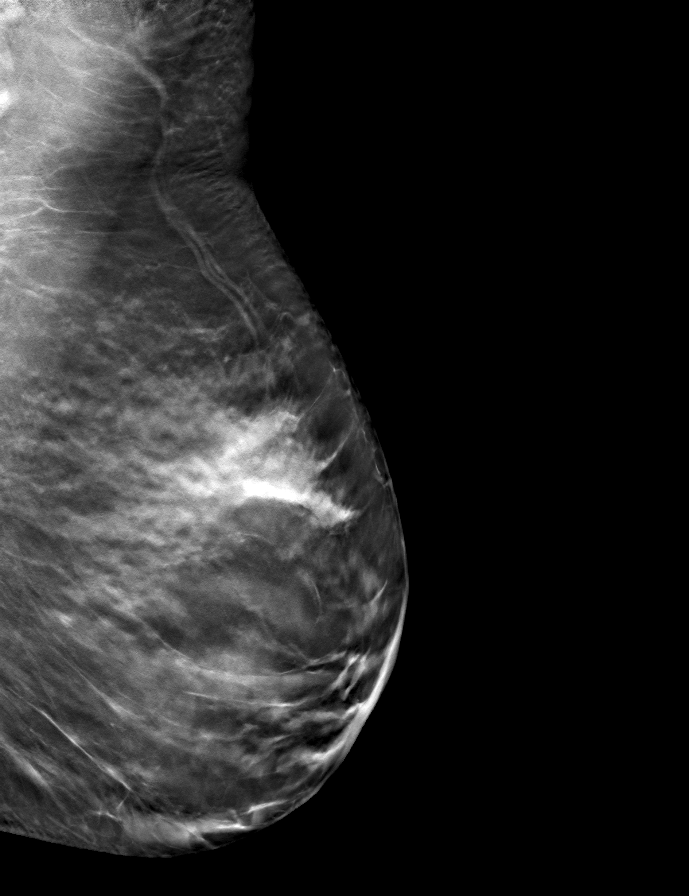
[frame 43/84]
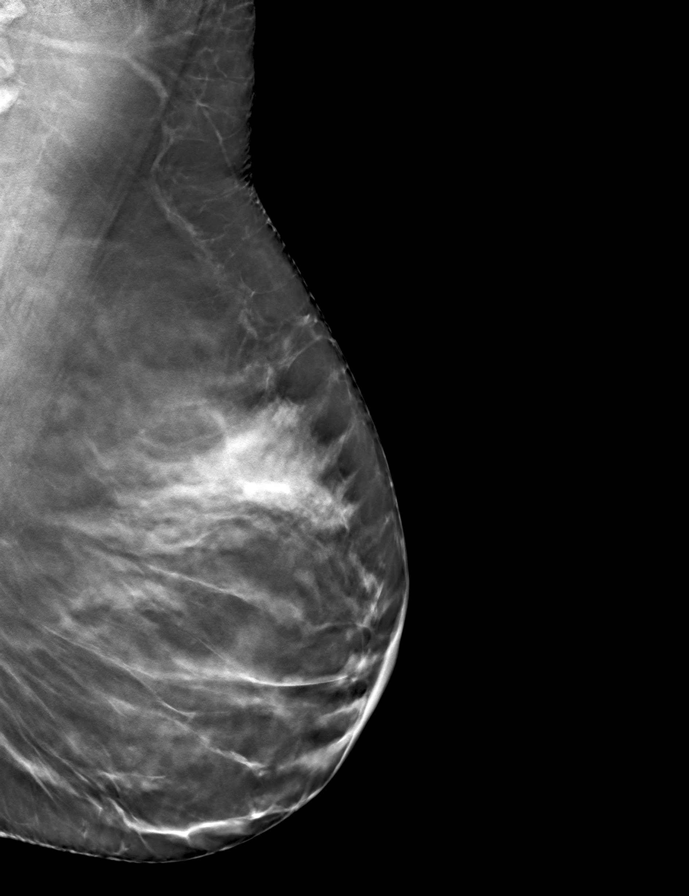

[R MLO tomo · tomo slice 45/88.0]
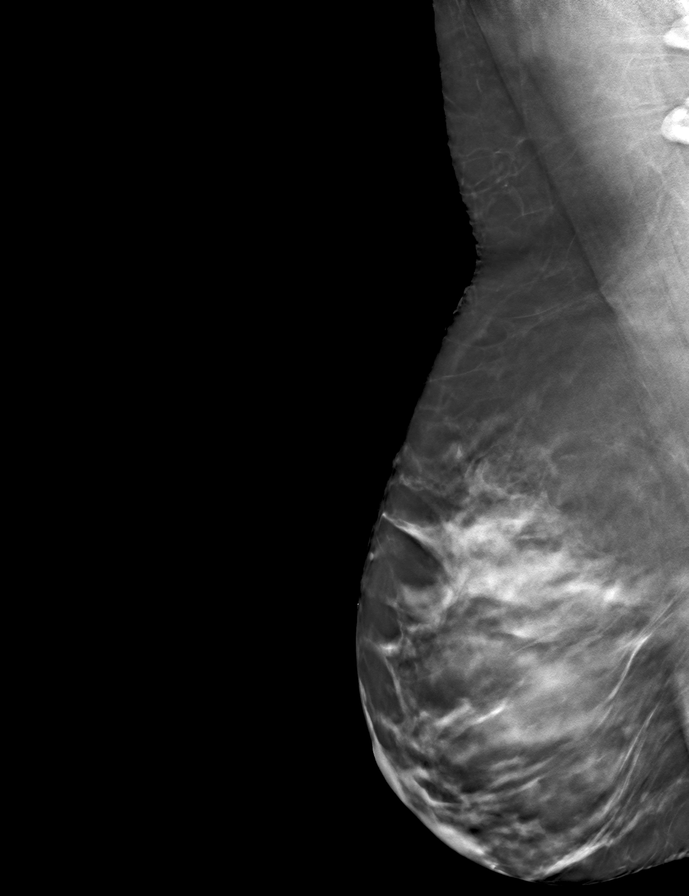

[R CC tomo · tomo slice 35/70.0]
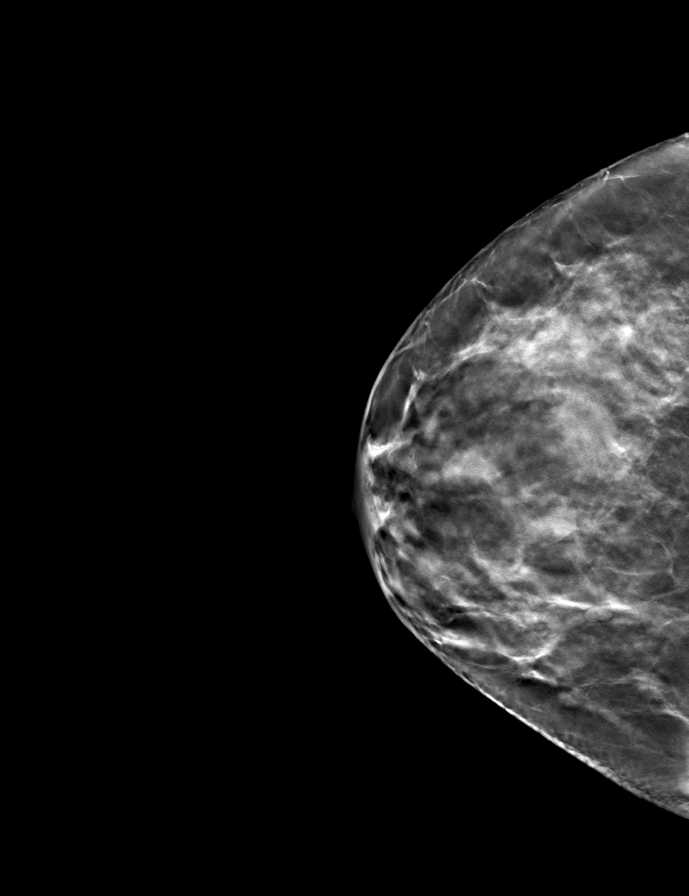

[L CC tomo · tomo slice 40/79.0]
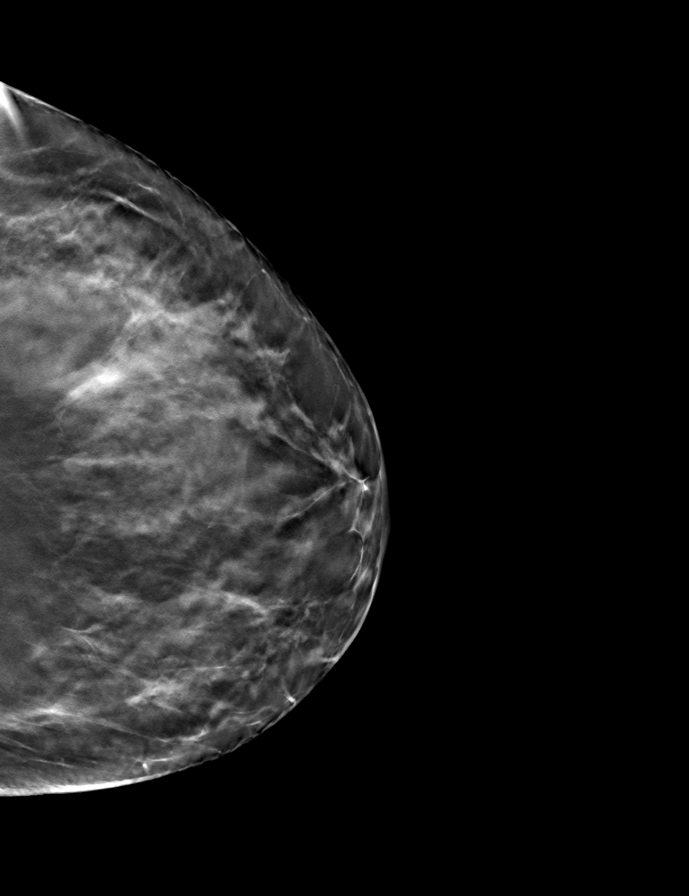

[9 of 24 positions shown; findings below may reference images not displayed]

ACR Breast Density Category c: The breast tissue is heterogeneously
dense, which may obscure small masses.
FINDINGS: There are no findings suspicious for malignancy.
IMPRESSION: No mammographic evidence of malignancy. A result letter of this
screening mammogram will be mailed directly to the patient.

RECOMMENDATION:
Screening mammogram in one year. (Code:Q3-W-BC3)

BI-RADS CATEGORY  1: Negative.

## 2024-02-01 ENCOUNTER — Other Ambulatory Visit: Payer: Self-pay

## 2024-02-01 ENCOUNTER — Encounter: Payer: Self-pay | Admitting: Oncology

## 2024-02-01 DIAGNOSIS — I2699 Other pulmonary embolism without acute cor pulmonale: Secondary | ICD-10-CM

## 2024-02-01 DIAGNOSIS — R911 Solitary pulmonary nodule: Secondary | ICD-10-CM

## 2024-02-01 NOTE — Telephone Encounter (Signed)
Please see attachment...

## 2024-02-03 ENCOUNTER — Encounter: Payer: Self-pay | Admitting: Family Medicine

## 2024-02-14 ENCOUNTER — Ambulatory Visit
Admission: RE | Admit: 2024-02-14 | Discharge: 2024-02-14 | Disposition: A | Discharge status: Home / Self Care | Source: Ambulatory Visit | Attending: Family Medicine | Admitting: Family Medicine

## 2024-02-14 DIAGNOSIS — Z1231 Encounter for screening mammogram for malignant neoplasm of breast: Secondary | ICD-10-CM | POA: Diagnosis present

## 2024-02-24 ENCOUNTER — Other Ambulatory Visit: Payer: Self-pay | Admitting: Emergency Medicine

## 2024-02-24 DIAGNOSIS — R0781 Pleurodynia: Secondary | ICD-10-CM

## 2024-03-09 ENCOUNTER — Ambulatory Visit
Admission: RE | Admit: 2024-03-09 | Discharge: 2024-03-09 | Disposition: A | Discharge status: Home / Self Care | Source: Ambulatory Visit | Attending: Emergency Medicine | Admitting: Emergency Medicine

## 2024-03-09 DIAGNOSIS — R0781 Pleurodynia: Secondary | ICD-10-CM | POA: Diagnosis present

## 2024-03-09 MED ORDER — TECHNETIUM TC 99M MEDRONATE IV KIT
20.0000 | PACK | Freq: Once | INTRAVENOUS | Status: AC | PRN
  Administered 2024-03-09: 21.53 via INTRAVENOUS

## 2024-04-06 ENCOUNTER — Ambulatory Visit
Admission: RE | Admit: 2024-04-06 | Discharge: 2024-04-06 | Disposition: A | Discharge status: Home / Self Care | Source: Ambulatory Visit | Attending: Oncology | Admitting: Oncology

## 2024-04-06 DIAGNOSIS — R911 Solitary pulmonary nodule: Secondary | ICD-10-CM | POA: Diagnosis present

## 2024-04-06 DIAGNOSIS — I2699 Other pulmonary embolism without acute cor pulmonale: Secondary | ICD-10-CM | POA: Insufficient documentation

## 2024-04-06 MED ORDER — IOHEXOL 350 MG/ML SOLN
75.0000 mL | Freq: Once | INTRAVENOUS | Status: AC | PRN
  Administered 2024-04-06: 75 mL via INTRAVENOUS

## 2024-04-13 ENCOUNTER — Other Ambulatory Visit: Payer: Self-pay

## 2024-04-13 DIAGNOSIS — I2699 Other pulmonary embolism without acute cor pulmonale: Secondary | ICD-10-CM

## 2024-04-16 ENCOUNTER — Inpatient Hospital Stay: Admitting: Oncology

## 2024-04-16 ENCOUNTER — Encounter: Payer: Self-pay | Admitting: Oncology

## 2024-04-16 ENCOUNTER — Inpatient Hospital Stay: Attending: Oncology

## 2024-04-16 VITALS — BP 113/80 | HR 75 | Temp 97.9°F | Resp 18 | Ht 68.0 in | Wt 203.0 lb

## 2024-04-16 DIAGNOSIS — Z7901 Long term (current) use of anticoagulants: Secondary | ICD-10-CM | POA: Diagnosis not present

## 2024-04-16 DIAGNOSIS — R911 Solitary pulmonary nodule: Secondary | ICD-10-CM | POA: Diagnosis not present

## 2024-04-16 DIAGNOSIS — Z87891 Personal history of nicotine dependence: Secondary | ICD-10-CM | POA: Insufficient documentation

## 2024-04-16 DIAGNOSIS — Z800 Family history of malignant neoplasm of digestive organs: Secondary | ICD-10-CM | POA: Insufficient documentation

## 2024-04-16 DIAGNOSIS — Z791 Long term (current) use of non-steroidal anti-inflammatories (NSAID): Secondary | ICD-10-CM | POA: Diagnosis not present

## 2024-04-16 DIAGNOSIS — K219 Gastro-esophageal reflux disease without esophagitis: Secondary | ICD-10-CM | POA: Diagnosis not present

## 2024-04-16 DIAGNOSIS — Z7951 Long term (current) use of inhaled steroids: Secondary | ICD-10-CM | POA: Insufficient documentation

## 2024-04-16 DIAGNOSIS — Z803 Family history of malignant neoplasm of breast: Secondary | ICD-10-CM | POA: Diagnosis not present

## 2024-04-16 DIAGNOSIS — D6852 Prothrombin gene mutation: Secondary | ICD-10-CM | POA: Insufficient documentation

## 2024-04-16 DIAGNOSIS — Z79899 Other long term (current) drug therapy: Secondary | ICD-10-CM | POA: Diagnosis not present

## 2024-04-16 DIAGNOSIS — I10 Essential (primary) hypertension: Secondary | ICD-10-CM | POA: Diagnosis not present

## 2024-04-16 DIAGNOSIS — I2699 Other pulmonary embolism without acute cor pulmonale: Secondary | ICD-10-CM

## 2024-04-16 DIAGNOSIS — Z86711 Personal history of pulmonary embolism: Secondary | ICD-10-CM

## 2024-04-16 DIAGNOSIS — Z79624 Long term (current) use of inhibitors of nucleotide synthesis: Secondary | ICD-10-CM | POA: Diagnosis not present

## 2024-04-16 LAB — D-DIMER, QUANTITATIVE: D-Dimer, Quant: 0.43 ug{FEU}/mL (ref 0.00–0.50)

## 2024-04-16 LAB — CBC WITH DIFFERENTIAL (CANCER CENTER ONLY)
Abs Immature Granulocytes: 0.04 K/uL (ref 0.00–0.07)
Basophils Absolute: 0 K/uL (ref 0.0–0.1)
Basophils Relative: 0 %
Eosinophils Absolute: 0.1 K/uL (ref 0.0–0.5)
Eosinophils Relative: 1 %
HCT: 37.9 % (ref 36.0–46.0)
Hemoglobin: 12.3 g/dL (ref 12.0–15.0)
Immature Granulocytes: 0 %
Lymphocytes Relative: 25 %
Lymphs Abs: 2.3 K/uL (ref 0.7–4.0)
MCH: 27.8 pg (ref 26.0–34.0)
MCHC: 32.5 g/dL (ref 30.0–36.0)
MCV: 85.6 fL (ref 80.0–100.0)
Monocytes Absolute: 0.5 K/uL (ref 0.1–1.0)
Monocytes Relative: 5 %
Neutro Abs: 6.2 K/uL (ref 1.7–7.7)
Neutrophils Relative %: 69 %
Platelet Count: 265 K/uL (ref 150–400)
RBC: 4.43 MIL/uL (ref 3.87–5.11)
RDW: 14.2 % (ref 11.5–15.5)
WBC Count: 9.1 K/uL (ref 4.0–10.5)
nRBC: 0 % (ref 0.0–0.2)

## 2024-04-16 LAB — CMP (CANCER CENTER ONLY)
ALT: 18 U/L (ref 0–44)
AST: 20 U/L (ref 15–41)
Albumin: 4.3 g/dL (ref 3.5–5.0)
Alkaline Phosphatase: 86 U/L (ref 38–126)
Anion gap: 9 (ref 5–15)
BUN: 9 mg/dL (ref 6–20)
CO2: 25 mmol/L (ref 22–32)
Calcium: 9.4 mg/dL (ref 8.9–10.3)
Chloride: 101 mmol/L (ref 98–111)
Creatinine: 0.68 mg/dL (ref 0.44–1.00)
GFR, Estimated: >60 mL/min (ref >60)
Glucose, Bld: 87 mg/dL (ref 70–99)
Potassium: 3.7 mmol/L (ref 3.5–5.1)
Sodium: 135 mmol/L (ref 135–145)
Total Bilirubin: 0.5 mg/dL (ref 0.0–1.2)
Total Protein: 7.8 g/dL (ref 6.5–8.1)

## 2024-04-16 MED ORDER — APIXABAN 5 MG PO TABS: 5.0000 mg | ORAL_TABLET | Freq: Two times a day (BID) | ORAL | 2 refills | Status: DC

## 2024-04-16 NOTE — Assessment & Plan Note (Addendum)
 Unprovoked acute pulmonary embolism. I recommend patient to continue anticoagulation with Eliquis  5 mg twice daily for total of 6 months.  Followed by low-dose anticoagulation prophylaxis long-term. Repeat CT showed resolution of left lung pulmonary embolism, subtle filling defect over a subsegmental right lower lobar artery.  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 I recommend patient to continue anticoagulation for another 3 months.  Hypercoagulable workup results were reviewed with patient. Anticardiolipin IgM 15, no clinical significance. Decreased protein S activity, recommend patient to repeat values in 3 months-result is pending. Elevated homocystine level, this could be secondary to vitamin deficiency, MTHFR mutation etc.  I recommend not to check MTHFR mutation as manipulating homocystine level does not lower thrombosis risk.

## 2024-04-16 NOTE — Progress Notes (Signed)
 Follow-up   History of Present Illness: [name] is a 43 y.o. female that presents to clinic for follow up visit. Still having left side pain, rpeat ct no clots, bone scan negative, ??? RLL clot. On eliquis , heme following. Pfts noted.     CLINICAL HISTORY: 21.72mCi MDP GIVEN VIA RT AC IV; RIB PAIN ON LEFT SIDE; PT TREATED FOR BLOOD CLOT ON LEFT SIDE - JUNE 2025 - LEFT RIB PAIN SINCE; NO HX FALLS / ACCIDENTS / TRAUMA.  FINDINGS: Physiologic uptake is shown within the bones. No abnormal uptake.  IMPRESSION: 1. No abnormal skeletal uptake.  Electronically signed by: Lonni Necessary MD 03/09/2024 03:40 PM EDT RP Workstation: [id number] Exam End: 03/09/24 15:32   Current Medications:  Current Outpatient Medications  Medication Sig Dispense Refill  . acetaminophen  (TYLENOL ) 500 MG tablet Take 1,000 mg by mouth every 8 (eight) hours    . albuterol MDI, PROVENTIL, VENTOLIN, PROAIR, HFA 90 mcg/actuation inhaler Inhale 2 inhalations into the lungs every 4 (four) hours as needed for Wheezing 1 each 0  . budesonide-formoteroL (SYMBICORT) 160-4.5 mcg/actuation inhaler Inhale 2 inhalations into the lungs 2 (two) times daily 10.2 g 0  . cholecalciferol 1000 unit tablet Take 2,000 Units by mouth once daily    . clotrimazole-betamethasone  (LOTRISONE) 1-0.05 % cream Apply topically 2 (two) times daily 45 g 0  . gabapentin  (NEURONTIN ) 300 MG capsule Take 2 capsules (600 mg total) by mouth 2 (two) times daily 360 capsule 3  . hydroCHLOROthiazide  (HYDRODIURIL ) 12.5 MG tablet Take 1 tablet (12.5 mg total) by mouth once daily 90 tablet 3  . metoprolol  SUCCinate (TOPROL -XL) 100 MG XL tablet Take 0.5 tablets (50 mg total) by mouth once daily 15 tablet 11  . pantoprazole  (PROTONIX ) 40 MG DR tablet Take 1 tablet (40 mg total) by mouth 2 (two) times daily before meals 180 tablet 3  . sertraline  (ZOLOFT ) 25 MG tablet Take 25 mg by mouth once daily as needed Per period cycle    . traMADoL  (ULTRAM )  50 mg tablet Take 50 mg by mouth every 6 (six) hours as needed    . valACYclovir (VALTREX) 1000 MG tablet Take 1 tablet (1,000 mg total) by mouth 2 (two) times daily 120 tablet 1  . apixaban  (ELIQUIS ) 5 mg tablet Take by mouth     No current facility-administered medications for this visit.    Problem List:  Patient Active Problem List  Diagnosis  . ANA positive  . Chronic fatigue  . Muscle pain, myofacial  . Chronic neck pain  . Numbness and tingling  . Carpal tunnel syndrome  . Radial neuropathy, right  . Facial numbness  . Arthritis of right ankle  . Migraine with aura and without status migrainosus, not intractable  . Trigeminal neuralgia of left side of face  . Pain in joint of right ankle  . Abdominal pain, lower  . Acute upper respiratory infection  . Biceps tendinitis, right  . Constipation  . Dizziness  . Esophageal reflux  . Family history of lupus erythematosus  . Herpes simplex infection  . Knee pain, chronic  . Localized edema  . Other specified health status  . Encounter for other general counseling and advice on contraception  . Thyromegaly  . Urinary frequency  . Vaginitis  . Vitamin D deficiency, unspecified  . Rib pain on left side  . Pulmonary nodules    Allergies:  Penicillin and Yeast, dried  History:  Past Medical History:  Diagnosis Date  . Anxiety   .  Constipation   . GERD (gastroesophageal reflux disease)   . Migraines     Past Surgical History:  Procedure Laterality Date  . EGD @ [facility]  05/02/2023   Normal EGD biopsy/No repeat/TKT  . CHOLECYSTECTOMY    . FRACTURE SURGERY      Family History  Problem Relation Name Age of Onset  . Kidney disease Mother    . Arthritis Mother    . Allergies Mother    . Autoimmune disease Mother    . Deep vein thrombosis (DVT or abnormal blood clot formation) Mother    . Lupus Mother  52  . Arthritis Father    . Myocardial Infarction (Heart attack) Father  27  . Hyperlipidemia (Elevated  cholesterol) Father    . High blood pressure (Hypertension) Father    . Benign prostatic hyperplasia Father    . Deep vein thrombosis (DVT or abnormal blood clot formation) Father    . Autoimmune disease Maternal Grandmother    . Glaucoma Maternal Grandmother    . Alzheimer's disease Paternal Grandmother    . Breast cancer Paternal Grandmother    . Depression Paternal Grandmother    . Stroke Paternal Grandmother       reports that she has quit smoking. Her smoking use included cigarettes. She has been exposed to tobacco smoke. She has never used smokeless tobacco. She reports that she does not currently use alcohol. She reports that she does not use drugs.  Review of System: As per above. All systems were reviewed with her in totality. Again, no other cardiopulmonary symptoms. No nausea, vomiting, diarrhea, blood in stools,dysuria or flank pain. No GERD, dysphagia, choking spells, polyphagia, polydipsia, heat or cold intolerance, rashes, lymph nodes, bleeding, seizures, TIAs, passing out spells, suicidaly ideation, proximal muscle tenderness, joint effusions. No strong history to suggest sleep apnea.     Physical Exam: BP 116/79 (BP Location: Left upper arm, Patient Position: Sitting, BP Cuff Size: Adult)   Pulse 83   Ht 170.2 cm (5' 7.01)   Wt 92.8 kg (204 lb 9.4 oz)   SpO2 99%   BMI 32.03 kg/m  92.8 kg (204 lb 9.4 oz) 99% General:  NAD. Able to speak in complete sentences without cough or dyspnea HEENT: Normocephalic, nontraumatic. Extraocular movements intact NECK: Supple. No JVD, nodes, thryomegaly CV: RRR no murmurs, gallops, rubs PULM: Normal respiratory effort, Clear to auscultation bilaterally without wheezing or crackles EXTREMITIES: No significant edema, cyanosis or Homans'signs SKIN: Fair turgor. No rashes LYMPHATIC: No nodes NEURO: No gross deficits PSYCH: Appropriate affect   Diagnostics: PFT: results documented in note by respiratory therapist with my  interpretation SPIROMETRY: FVC was 2.38 L, 70 % of predicted FEV1 was 1.64 L, 60 % of predicted FEV1/FVC ratio was  85 % of predicted FEF 25-75% liters per second was 42 % of predicted  LUNG VOLUMES: TLC was 85 % of predicted RV was 166 % of predicted  DIFFUSION CAPACITY: DLCO was 78 % of predicted DLCO/VA was 118 % of predicted   Good patient effort with good repeatability. Rib pain may have limited efforts on this study. Showed patient how to splint for pain but not certain how helpful it was.   Interpretation: Chest wall pain the spiro suggest mild obstruction, rv elevated ? Air trapping, dlco is slightly decreased  Repeat when chest wall pain is resolved  Interpreting Physician Dr. Theotis     Units 1 mo ago Comments  D-Dimer, Quant 0.00 - 0.50 ug/mL-FEU 2.67 High  CT ANGIOGRAPHY CHEST WITH CONTRAST   TECHNIQUE:  Multidetector CT imaging of the chest was performed using the  standard protocol during bolus administration of intravenous  contrast. Multiplanar CT image reconstructions and MIPs were  obtained to evaluate the vascular anatomy.   RADIATION DOSE REDUCTION: This exam was performed according to the  departmental dose-optimization program which includes automated  exposure control, adjustment of the mA and/or kV according to  patient size and/or use of iterative reconstruction technique.   CONTRAST:  75mL OMNIPAQUE  IOHEXOL  350 MG/ML SOLN   COMPARISON:  None Available.   FINDINGS:  Cardiovascular: Tubular filling defects within the proximal LEFT  lower lobe segmental pulmonary arteries (images 51 through 66 series  4). Findings consistent with acute pulmonary thromboemboli. No  evidence emboli within the RIGHT lung.   No evidence of RIGHT ventricular strain on CT imaging. Overall clot  burden is moderate.   Mediastinum/Nodes: No axillary or supraclavicular adenopathy. No  mediastinal or hilar adenopathy. No pericardial fluid. Esophagus   normal.   Lungs/Pleura:   Several peripheral densities in the LEFT lower lobe could represent  small infarctions (image 72 and 82 of series 5). No pneumonia. No  pneumothorax.   In the RIGHT upper lobe, rounded pulmonary nodule measures 9 mm  (image 51/series 5).   Upper Abdomen: Limited view of the liver, kidneys, pancreas are  unremarkable. Normal adrenal glands.   Musculoskeletal: No aggressive osseous lesion.   Review of the MIP images confirms the above findings.   IMPRESSION:  1. Acute pulmonary thromboemboli in the LEFT lower lobe segmental  pulmonary arteries. Moderate clot burden. No evidence of RIGHT  ventricular strain.  2. Small peripheral densities in the LEFT lower lobe could represent  small infarctions.  3. Right solid pulmonary nodule within the upper lobe measuring 9  mm. Per Fleischner Society Guidelines, consider a non-contrast Chest  CT at 3 months, a PET/CT, or tissue sampling. These guidelines do  not apply to immunocompromised patients and patients with cancer.  Follow up in patients with significant comorbidities as clinically  warranted. For lung cancer screening, adhere to Lung-RADS  guidelines. Reference: Radiology. 2017; 284(1):228-43.   Electronically Signed:  By: Jackquline Boxer M.D.  On: 12/14/2023 16:27      IMPRESSION:  No evidence of deep venous thrombosis in either lower extremity.    Electronically Signed    By: Greig Pique M.D.    On: 12/14/2023 18:37    EXAM: X-ray chest PA and lateral   INDICATION:   Left-sided chest pain [R07.9 (ICD-10-CM)] Left upper quadrant pain [R10.12 (ICD-10-CM)] Shortness of breath [R06.02 (ICD-10-CM)] Chills [M31.16 (ICD-10-CM)]   COMPARISON: 06/13/2020   FINDINGS: No acute osseus abnormality. Pleural spaces are normal. Relative hyperinflation in comparison to prior study. No focal consolidation. Normal appearance of the heart and mediastinum.   IMPRESSION: No acute cardiopulmonary  process.   BRENDAN CHRISTOPHER CLINE, MD    Component Ref Range & Units 1 mo ago Comments  AntiThromb III Func 75 - 120 % 132 High       Protein C Activity 73 - 180 % 101    Protein C, Total 60 - 150 % 98    Protein S Activity 63 - 140 % 57 Low     Protein S Ag, Total 60 - 150 % 121    PTT Lupus Anticoagulant 0.0 - 43.5 sec 31.5    DRVVT 0.0 - 47.0 sec 46.5      Beta-2  Glyco I IgG 0 -  20 GPI IgG units <9    Beta-2 -Glycoprotein I IgM 0 - 32 GPI IgM units <9    Homocysteine 0.0 - 14.5 umol/L 32.3 High     Component Ref Range & Units 1 mo ago Comments  Anticardiolipin IgG 0 - 14 GPL U/mL <9 (NOTE)                          Negative:              <15                          Indeterminate:     15 - 20                          Low-Med Positive: >20 - 80                          High Positive:         >80  Anticardiolipin IgM 0 - 12 MPL U/mL 15 High  (NOTE)                          Negative:              <13                          Indeterminate:     13 - 20                          Low-Med Positive: >20 - 80                          High Positive:         >80  Anticardiolipin IgA 0 - 11 APL U/mL <9        omponent Ref Range & Units (hover) 4 mo ago (09/22/23) 1 yr ago (08/05/22) 2 yr ago (11/04/21) 2 yr ago (06/15/21) 3 yr ago (06/13/20)     Thyroid  Stimulating Hormone (TSH) 1.566 1.905 <redacted file path> CM 1.095 <redacted file path> CM 0.855 <redacted file path> CM 1.682 <redacted file path> C      Leukemia/lymphoma panel negative.    Last pap smear was NORMAL ( 1/25)    Component Ref Range & Units (hover) 2 yr ago     NIL 0.00   TB 1 Antigen 0.00   TB 2 Antigen 0.01   Mitogen 10.00   Quantiferon TB Negative    Creatinine 0.6      Component Ref Range & Units (hover) 7 d ago     D-Dimer - [facility] 0.24    ngio Convert Enzyme - [facility] 53    omponent Ref Range & Units (hover) 3 wk ago     Anti-MPO Antibodies - [facility] <0.2   Anti-PR3  Antibodies - [facility] <0.2   Gytoplasmic (C-ANCA) - [facility] <1:20   Perinuclear (P-ANCA) - [facility] <1:20  Comment: The presence of positive fluorescence exhibiting P-ANCA or C-ANCA patterns alone is not specific for the diagnosis of Wegener's Granulomatosis (WG) or microscopic polyangiitis. Decisions about treatment should not be based solely on ANCA IFA results.  The International ANCA Group Consensus recommends follow up testing of positive sera with  both PR-3 and MPO-ANCA enzyme immunoassays. As many as 5% serum samples are positive only by EIA. Ref. AM J Clin Pathol 1999;111:507-513.   Atypical pANCA - [facility] <1:20    Component Ref Range & Units (hover) 3 wk ago     QuantiFERON Incubation - [facility] Incubation performed.   QuantiFERON Criteria - [facility] Comment  Comment: QuantiFERON-TB Gold Plus is a Chartered certified accountant for M tuberculosis infection (including disease) and is intended for use in conjunction with risk assessment, radiography, and other medical and diagnostic evaluations. The QuantiFERON-TB Gold Plus result is determined by subtracting the Nil value from either TB antigen (Ag) value. The Mitogen tube serves as a control for the test.   QuantiFERON TB1 Ag Value - [facility] 0.10   QuantiFERON TB2 Ag Value - [facility] 0.08   QuantiFERON Nil Value - [facility] 0.05   QuantiFERON Mitogen Value - [facility] >10.00   QuantiFERON TB Gold - [facility] Negative      EKG: Normal bradycardia, on tprol and no ischemic changes   Cxr left lower lung atelectasis, vs infarst   Impression? Plan:    1. Pulmonary  embolism ( LLL), resooved on recent cta. ? RLL clot on recent chest ct.,  on eliquis , still c/o left side ? chest discomfort, last hours. Doses not sound cardiac. No pleuritc. ? Residual infarct vs neuro muscular. Cxr atelectasis. Boner scan showed no skeletal lesions or fractures - eliquis   - tramadol  50 mg tid prnm ( for pain)) - trial of colchicine ( chest wall pain) - heme  following   2. Nine  mm RML  pulmonary  nodule  in a smolker, no tb exposure ( hx of negative ppd in the past)   chest ct in 9/25 showed no change -repeat chest ct in 3- 6 months   3. A little over weight, no strong hx of osa - weight loss   4. 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

## 2024-04-16 NOTE — Progress Notes (Signed)
 Hematology/Oncology Consult note [phone] [phone]        REFERRING PROVIDER: Stanton Lynwood FALCON, MD   CHIEF COMPLAINTS/REASON FOR VISIT:  acute pulmonary embolism  ASSESSMENT & PLAN:   History of pulmonary embolism Unprovoked acute pulmonary embolism. I recommend patient to continue anticoagulation with Eliquis  5 mg twice daily for total of 6 months.  Followed by low-dose anticoagulation prophylaxis long-term. Repeat CT showed resolution of left lung pulmonary embolism, subtle filling defect over a subsegmental right lower lobar artery.  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 I recommend patient to continue anticoagulation for another 3 months.  Hypercoagulable workup results were reviewed with patient. Anticardiolipin IgM 15, no clinical significance. Decreased protein S activity, recommend patient to repeat values in 3 months-result is pending. Elevated homocystine level, this could be secondary to vitamin deficiency, MTHFR mutation etc.  I recommend not to check MTHFR mutation as manipulating homocystine level does not lower thrombosis risk.   Nodule of right lung Lung nodule is stable on CT scan.  Size is borderline for detect threshold for PET scan Recommend repeat CT chest without contrast in 3 months.   Orders Placed This Encounter  Procedures   CT Chest Wo Contrast    Standing Status:   Future    Expected Date:   07/17/2024    Expiration Date:   04/16/2025    Is patient pregnant?:   No    Preferred imaging location?:   [facility]   CBC with Differential (Cancer Center Only)    Standing Status:   Future    Expected Date:   07/17/2024    Expiration Date:   10/15/2024   CMP (Cancer Center only)    Standing Status:   Future    Expected Date:   07/17/2024    Expiration Date:   10/15/2024   D-dimer, quantitative    Standing Status:   Future    Expected Date:   07/17/2024    Expiration Date:   10/15/2024   Follow-up in 3 months. All questions  were answered. The patient knows to call the clinic with any problems, questions or concerns.  [name], MD, PhD [facility] Hematology Oncology 04/16/2024   HISTORY OF PRESENTING ILLNESS:   [name] is a  44 y.o.  female with PMH listed below was seen in consultation at the request of  Stanton Lynwood FALCON, MD  for evaluation of acute unprovoked pulmonary embolism.  12/14/2023 - 12/16/2023, patient was hospitalized due to left chest pain, sweating and chills.  He went to emergency room for evaluation.  12/14/2023, CT angiogram chest PE protocol showed acute pulmonary emboli in the left lower lobe segmental pulmonary arteries, moderate clot burden.  No evidence of right heart strain.  Small pleural flow densities in the left lower lobe could represent small infarctions.  Right lung solid nodule within the upper lobe measuring 9 mm.  Patient is someday smoker, quit recently after this admission.  Bilateral lower extremity venous ultrasound is negative for DVT  Patient was admitted and started on anticoagulation.  She was discharged on Eliquis  10 mg twice daily for 7 days followed by 5 mg twice daily. Currently she is on 5 mg twice daily.  She tolerates well.  Denies any bleeding events.  He denies any triggering immobilization factors for this event.  This is her first thrombosis event.  Family history of DVT. Since started on anticoagulation, chest pain has subsided.  No shortness of breath.  During this hospitalization, hypercoagulable workup  was obtained. Patient has negative factor V Leiden mutation, prothrombin gene mutation. Slightly elevated cardiolipin IgM 15, normal IgG level.  Negative beta-2  glycoprotein.  Negative lupus anticoagulant. Patient has slightly decreased protein S activity.  Normal protein C antigen activity.  Increased homocystine level at 32.3.  INTERVAL HISTORY [name] is a 43 y.o. female who has above history reviewed by me today presents for follow up visit  for history of unprovoked acute pulm embolism.  Patient takes Eliquis  5 mg twice daily.  She reports feeling well.  Denies shortness of breath, chest pain or acute bleeding events.    MEDICAL HISTORY:  Past Medical History:  Diagnosis Date   Anxiety    Arthritis    right ankle   Bony exostosis 09/2014   right knee   GERD (gastroesophageal reflux disease)    History of migraine    Hypertension    Neuromuscular disorder (HCC)    trigeminal nerve pain   Pain, eye, left 10/10/2014   with tingling - states possible shingles; to see PCP   Painful orthopaedic hardware 09/2014   right ankle    SURGICAL HISTORY: Past Surgical History:  Procedure Laterality Date   BONE EXOSTOSIS EXCISION Right 10/16/2014   Procedure: EXCISION OF BONY EXOSTOSIS FROM RIGHT KNEE;  Surgeon: Kay CHRISTELLA Cummins, MD;  Location: [facility];  Service: Orthopedics;  Laterality: Right;   CHOLECYSTECTOMY  07/12/2006   DILATATION & CURRETTAGE/HYSTEROSCOPY WITH RESECTOCOPE N/A 09/24/2022   Procedure: DILATATION & CURETTAGE/HYSTEROSCOPY WITH RESECTOCOPE;  Surgeon: Rutherford Gain, MD;  Location: [facility];  Service: Gynecology;  Laterality: N/A;   ENDOMETRIAL ABLATION N/A 09/24/2022   Procedure: ENDOMETRIAL ABLATION;  Surgeon: Rutherford Gain, MD;  Location: [facility];  Service: Gynecology;  Laterality: N/A;   HARDWARE REMOVAL Right 10/16/2014   Procedure: HARDWARE REMOVAL RIGHT ANKLE;  Surgeon: Kay CHRISTELLA Cummins, MD;  Location: [facility];  Service: Orthopedics;  Laterality: Right;   KNEE ARTHROSCOPY Right 09/01/2007   ORIF ANKLE FRACTURE BIMALLEOLAR Right 02/28/2007   ORIF TIBIA FRACTURE Right 02/28/2007   rod placement right   TIBIA HARDWARE REMOVAL Right 09/01/2007    SOCIAL HISTORY: Social History   Socioeconomic History   Marital status: Single    Spouse name: Not on file   Number of children: Not on file   Years of education: Not on file    Highest education level: Not on file  Occupational History   Not on file  Tobacco Use   Smoking status: Former    Current packs/day: 0.25    Types: Cigarettes   Smokeless tobacco: Never   Tobacco comments:    Pt. States she occasionally will smoke not everyday  Vaping Use   Vaping status: Never Used  Substance and Sexual Activity   Alcohol use: Not Currently    Comment: occasionally   Drug use: No   Sexual activity: Not Currently    Birth control/protection: None  Other Topics Concern   Not on file  Social History Narrative   Not on file   Social Drivers of Health   Financial Resource Strain: Low Risk  (01/13/2024)   Received from [facility]   Overall Financial Resource Strain (CARDIA)    Difficulty of Paying Living Expenses: Not very hard  Food Insecurity: Food Insecurity Present (01/13/2024)   Received from [facility]   Hunger Vital Sign    Within the past 12 months, you worried that your food would  run out before you got the money to buy more.: Sometimes true    Within the past 12 months, the food you bought just didn't last and you didn't have money to get more.: Sometimes true  Transportation Needs: No Transportation Needs (01/13/2024)   Received from [facility] - Transportation    In the past 12 months, has lack of transportation kept you from medical appointments or from getting medications?: No    Lack of Transportation (Non-Medical): No  Physical Activity: Insufficiently Active (09/29/2017)   Exercise Vital Sign    Days of Exercise per Week: 2 days    Minutes of Exercise per Session: 30 min  Stress: Stress Concern Present (09/29/2017)   [employer] [facility] [employer] - Occupational Stress Questionnaire    Feeling of Stress : To some extent  Social Connections: Unknown (02/10/2022)   Received from [facility]   Social Network    Social Network: Not on file  Intimate Partner Violence: Not At  Risk (01/05/2024)   Humiliation, Afraid, Rape, and Kick questionnaire    Fear of Current or Ex-Partner: No    Emotionally Abused: No    Physically Abused: No    Sexually Abused: No    FAMILY HISTORY: Family History  Problem Relation Age of Onset   Lupus Mother    Heart disease Father    Rheum arthritis Father    Myasthenia gravis Maternal Grandmother    Diabetes Paternal Grandmother    Breast cancer Paternal Grandmother 72   Colon cancer Paternal Uncle    Colon cancer Maternal Uncle     ALLERGIES:  is allergic to penicillin g, yeast-derived drug products, and penicillins.  MEDICATIONS:  Current Outpatient Medications  Medication Sig Dispense Refill   acetaminophen  (TYLENOL ) 500 MG tablet Take 2 tablets (1,000 mg total) by mouth every 8 (eight) hours. 30 tablet 0   albuterol (VENTOLIN HFA) 108 (90 Base) MCG/ACT inhaler Inhale 2 puffs into the lungs every 6 (six) hours as needed for wheezing or shortness of breath.     apixaban  (ELIQUIS ) 5 MG TABS tablet Take 1 tablet (5 mg total) by mouth 2 (two) times daily. 60 tablet 2   cholecalciferol (VITAMIN D3) 25 MCG (1000 UNIT) tablet Take 1,000 Units by mouth daily.     colchicine 0.6 MG tablet Take 0.6 mg by mouth.     cyclobenzaprine (FLEXERIL) 10 MG tablet Take 10 mg by mouth as needed for muscle spasms.     folic acid  (FOLVITE ) 1 MG tablet Take 1 mg by mouth daily.     gabapentin  (NEURONTIN ) 300 MG capsule Take 600 mg by mouth 2 (two) times daily.     hydrochlorothiazide  (HYDRODIURIL ) 12.5 MG tablet Take 12.5 mg by mouth daily.     MAGNESIUM GLYCINATE PO Take 2 capsules by mouth at bedtime. Pt unsure of mg dose     metoprolol  succinate (TOPROL -XL) 50 MG 24 hr tablet Take 1 tablet by mouth daily.     pantoprazole  (PROTONIX ) 40 MG tablet Take 40 mg by mouth 2 (two) times daily before a meal.     polyethylene glycol (MIRALAX  / GLYCOLAX ) 17 g packet Take 17 g by mouth daily as needed for mild constipation. 14 each 0   sertraline   (ZOLOFT ) 100 MG tablet Take 1 tablet (100 mg total) by mouth daily. 30 tablet 11   traMADol  (ULTRAM ) 50 MG tablet Take 1 tablet (50 mg total) by mouth every 6 (six) hours as needed. 10  tablet 0   valACYclovir (VALTREX) 1000 MG tablet Take 1,000 mg by mouth 2 (two) times daily.     budesonide-formoterol (SYMBICORT) 160-4.5 MCG/ACT inhaler Inhale 2 puffs into the lungs. (Patient not taking: Reported on 04/16/2024)     diclofenac Sodium (VOLTAREN) 1 % GEL APPLY 2 TO 4 GRAMS AA BID (Patient not taking: Reported on 04/16/2024)     lidocaine  (LIDODERM ) 5 % Place 1 patch onto the skin daily. Remove & Discard patch within 12 hours or as directed by MD (Patient not taking: Reported on 04/16/2024) 10 patch 0   Current Facility-Administered Medications  Medication Dose Route Frequency Provider Last Rate Last Admin   betamethasone  acetate-betamethasone  sodium phosphate  (CELESTONE ) injection 3 mg  3 mg Intra-articular Once         Review of Systems  Constitutional:  Negative for appetite change, chills, fatigue and fever.  HENT:   Negative for hearing loss and voice change.   Eyes:  Negative for eye problems.  Respiratory:  Negative for chest tightness and cough.   Cardiovascular:  Negative for chest pain.  Gastrointestinal:  Negative for abdominal distention, abdominal pain and blood in stool.  Endocrine: Negative for hot flashes.  Genitourinary:  Negative for difficulty urinating and frequency.   Musculoskeletal:  Negative for arthralgias.  Skin:  Negative for itching and rash.  Neurological:  Negative for extremity weakness.  Hematological:  Negative for adenopathy.  Psychiatric/Behavioral:  Negative for confusion.    PHYSICAL EXAMINATION: ECOG PERFORMANCE STATUS: 0 - Asymptomatic Vitals:   04/16/24 1433  BP: 113/80  Pulse: 75  Resp: 18  Temp: 97.9 F (36.6 C)  SpO2: 100%   Filed Weights   04/16/24 1433  Weight: 203 lb (92.1 kg)    Physical Exam Constitutional:      General: She is  not in acute distress. HENT:     Head: Normocephalic and atraumatic.  Eyes:     General: No scleral icterus. Cardiovascular:     Rate and Rhythm: Normal rate and regular rhythm.     Heart sounds: Normal heart sounds.  Pulmonary:     Effort: Pulmonary effort is normal. No respiratory distress.     Breath sounds: No wheezing.  Abdominal:     General: Bowel sounds are normal. There is no distension.     Palpations: Abdomen is soft.  Musculoskeletal:        General: No deformity. Normal range of motion.     Cervical back: Normal range of motion and neck supple.  Skin:    General: Skin is warm and dry.     Findings: No erythema or rash.  Neurological:     Mental Status: She is alert and oriented to person, place, and time. Mental status is at baseline.     Cranial Nerves: No cranial nerve deficit.     Coordination: Coordination normal.  Psychiatric:        Mood and Affect: Mood normal.     LABORATORY DATA:  I have reviewed the data as listed    Latest Ref Rng & Units 04/16/2024    2:12 PM 01/05/2024    4:07 PM 12/16/2023    4:52 AM  CBC  WBC 4.0 - 10.5 K/uL 9.1  5.3  9.4   Hemoglobin 12.0 - 15.0 g/dL 87.6  87.3  87.3   Hematocrit 36.0 - 46.0 % 37.9  37.5  38.2   Platelets 150 - 400 K/uL 265  103  121       Latest  Ref Rng & Units 04/16/2024    2:12 PM 12/15/2023    4:48 AM 09/24/2022    8:56 AM  CMP  Glucose 70 - 99 mg/dL 87  896  895   BUN 6 - 20 mg/dL 9  9  11    Creatinine 0.44 - 1.00 mg/dL 9.31  9.42  9.34   Sodium 135 - 145 mmol/L 135  143  137   Potassium 3.5 - 5.1 mmol/L 3.7  3.1  3.7   Chloride 98 - 111 mmol/L 101  107  106   CO2 22 - 32 mmol/L 25  28  23    Calcium 8.9 - 10.3 mg/dL 9.4  8.9  9.0   Total Protein 6.5 - 8.1 g/dL 7.8     Total Bilirubin 0.0 - 1.2 mg/dL 0.5     Alkaline Phos 38 - 126 U/L 86     AST 15 - 41 U/L 20     ALT 0 - 44 U/L 18         RADIOGRAPHIC STUDIES: I have personally reviewed the radiological images as listed and agreed with the  findings in the report. CT Angio Chest Pulmonary Embolism (PE) W or WO Contrast Addendum Date: 04/06/2024 ADDENDUM REPORT: 04/06/2024 10:38 ADDENDUM: These results were called by telephone at the time of interpretation on 04/06/2024 at 10:38 am to provider [facility] , who verbally acknowledged these results. Electronically Signed   By: Toribio Agreste M.D.   On: 04/06/2024 10:38   Result Date: 04/06/2024 CLINICAL DATA:  Dyspnea and left lower chest pain and pressure since June 2025. EXAM: CT ANGIOGRAPHY CHEST WITH CONTRAST TECHNIQUE: Multidetector CT imaging of the chest was performed using the standard protocol during bolus administration of intravenous contrast. Multiplanar CT image reconstructions and MIPs were obtained to evaluate the vascular anatomy. RADIATION DOSE REDUCTION: This exam was performed according to the departmental dose-optimization program which includes automated exposure control, adjustment of the mA and/or kV according to patient size and/or use of iterative reconstruction technique. CONTRAST:  75mL OMNIPAQUE  IOHEXOL  350 MG/ML SOLN COMPARISON:  12/14/2023 FINDINGS: Cardiovascular: Heart is normal in size. Thoracic aorta is normal in caliber. Pulmonary arterial system is well opacified and demonstrates resolution of the previously seen emboli over the left pulmonary arterial tree. There is a subtle filling defect over a subsegmental right lower lobar artery over the medial basilar segment likely representing small embolus which may be acute or chronic. This was not well seen on the previous exam. Remaining vascular structures are unremarkable. Mediastinum/Nodes: No significant mediastinal or hilar adenopathy. A few small prevascular space lymph nodes. Remaining mediastinal structures are unremarkable. Stable small bilateral axillary and retropectoral lymph nodes. Lungs/Pleura: Lungs are adequately inflated. Minimal dependent bibasilar and lingular atelectasis. No lobar consolidation or  effusion. Stable 8-9 mm nodule over the central right upper lobe (image 46). Airways are normal. Upper Abdomen: No acute findings. Musculoskeletal: No acute findings. Review of the MIP images confirms the above findings. IMPRESSION: 1. Resolution of the previously seen left-sided pulmonary emboli. Subtle filling defect over a subsegmental right lower lobar artery over the medial basilar segment likely representing small embolus which may be acute or chronic. 2. No acute pulmonary disease. Minimal dependent bibasilar and lingular atelectasis. 3. Stable 8-9 mm nodule over the central right upper lobe. Consider one of the following in 3 months for both low-risk and high-risk individuals: (a) repeat chest CT, (b) follow-up PET-CT, or (c) tissue sampling. This recommendation follows the consensus statement: Guidelines for  Management of Incidental Pulmonary Nodules Detected on CT Images: From the Fleischner Society 2017; Radiology 2017; 284:228-243. Paging provider. Electronically Signed: By: Toribio Agreste M.D. On: 04/06/2024 09:06   NM Bone Scan Limited Result Date: 03/09/2024 EXAM: LIMITED BONE SCAN 03/09/2024 03:32:00 PM TECHNIQUE: Following administration of radiotracer, dynamic planar images are obtained followed by blood pool/soft tissue phase planar images. Delayed phase planar images are also obtained. Images are obtained of the ribs. RADIOPHARMACEUTICAL: 21.53 mCi Tc-46m MDP. COMPARISON: None available. CLINICAL HISTORY: 21.17mCi MDP GIVEN VIA RT AC IV; RIB PAIN ON LEFT SIDE; PT TREATED FOR BLOOD CLOT ON LEFT SIDE - JUNE 2025 - LEFT RIB PAIN SINCE; NO HX FALLS / ACCIDENTS / TRAUMA. FINDINGS: Physiologic uptake is shown within the bones. No abnormal uptake. IMPRESSION: 1. No abnormal skeletal uptake. Electronically signed by: Lonni Necessary MD 03/09/2024 03:40 PM EDT RP Workstation: [id number]   MM 3D SCREENING MAMMOGRAM BILATERAL BREAST Result Date: 02/16/2024 CLINICAL DATA:  Screening. EXAM: DIGITAL  SCREENING BILATERAL MAMMOGRAM WITH TOMOSYNTHESIS AND CAD TECHNIQUE: Bilateral screening digital craniocaudal and mediolateral oblique mammograms were obtained. Bilateral screening digital breast tomosynthesis was performed. The images were evaluated with computer-aided detection. COMPARISON:  Previous exam(s). ACR Breast Density Category c: The breasts are heterogeneously dense, which may obscure small masses. FINDINGS: There are no findings suspicious for malignancy. IMPRESSION: No mammographic evidence of malignancy. A result letter of this screening mammogram will be mailed directly to the patient. RECOMMENDATION: Screening mammogram in one year. (Code:SM-B-01Y) BI-RADS CATEGORY  1: Negative. Electronically Signed   By: Alm Parkins M.D.   On: 02/16/2024 15:30

## 2024-04-16 NOTE — Assessment & Plan Note (Signed)
 Lung nodule is stable on CT scan.  Size is borderline for detect threshold for PET scan Recommend repeat CT chest without contrast in 3 months.

## 2024-04-17 ENCOUNTER — Encounter: Payer: Self-pay | Admitting: Oncology

## 2024-04-18 LAB — PROTEIN S ACTIVITY: Protein S Activity: 86 % (ref 63–140)

## 2024-04-25 ENCOUNTER — Telehealth: Payer: Self-pay | Admitting: *Deleted

## 2024-04-25 NOTE — Telephone Encounter (Signed)
 The dentist office called just a if they have gotten the paper and when it will be faxed back to them so that they can take care of the patient.  I told him that they have the paper and they are waiting for the doctor to have a moment so that they can sign for and so they will fax it after that.  They asked me if we knew the fax number and it looks like it is [phone]

## 2024-05-15 ENCOUNTER — Other Ambulatory Visit: Payer: Self-pay | Admitting: Physical Medicine & Rehabilitation

## 2024-05-15 DIAGNOSIS — M5414 Radiculopathy, thoracic region: Secondary | ICD-10-CM

## 2024-05-23 ENCOUNTER — Ambulatory Visit
Admission: RE | Admit: 2024-05-23 | Discharge: 2024-05-23 | Disposition: A | Discharge status: Home / Self Care | Source: Ambulatory Visit | Attending: Physical Medicine & Rehabilitation | Admitting: Physical Medicine & Rehabilitation

## 2024-05-23 DIAGNOSIS — M5414 Radiculopathy, thoracic region: Secondary | ICD-10-CM

## 2024-06-05 ENCOUNTER — Telehealth: Payer: Self-pay

## 2024-06-05 ENCOUNTER — Other Ambulatory Visit: Payer: Self-pay

## 2024-06-05 DIAGNOSIS — I2699 Other pulmonary embolism without acute cor pulmonale: Secondary | ICD-10-CM

## 2024-06-05 NOTE — Telephone Encounter (Signed)
 Pt scheduled for CT chest wo in January. Per Dr. Babara please switch to CT chest angio instead and cancel CT chest wo. Keep other appts as scheduled.

## 2024-06-12 ENCOUNTER — Telehealth: Payer: Self-pay | Admitting: Oncology

## 2024-06-12 NOTE — Telephone Encounter (Signed)
 Called pt to let her know.

## 2024-06-12 NOTE — Telephone Encounter (Signed)
 Lauren, can you cancel CT chest please. She only need CT chest angio

## 2024-06-22 ENCOUNTER — Encounter: Payer: Self-pay | Admitting: Podiatry

## 2024-06-22 ENCOUNTER — Ambulatory Visit

## 2024-06-22 ENCOUNTER — Ambulatory Visit (INDEPENDENT_AMBULATORY_CARE_PROVIDER_SITE_OTHER)

## 2024-06-22 ENCOUNTER — Ambulatory Visit: Admitting: Podiatry

## 2024-06-22 DIAGNOSIS — M7742 Metatarsalgia, left foot: Secondary | ICD-10-CM | POA: Diagnosis not present

## 2024-06-22 DIAGNOSIS — M7751 Other enthesopathy of right foot: Secondary | ICD-10-CM | POA: Diagnosis not present

## 2024-06-22 DIAGNOSIS — M7741 Metatarsalgia, right foot: Secondary | ICD-10-CM | POA: Diagnosis not present

## 2024-06-22 MED ORDER — MELOXICAM 15 MG PO TABS: 15.0000 mg | ORAL_TABLET | Freq: Every day | ORAL | 1 refills | Status: AC

## 2024-06-22 MED ORDER — METHYLPREDNISOLONE 4 MG PO TBPK: ORAL_TABLET | ORAL | 0 refills | Status: AC

## 2024-06-22 NOTE — Progress Notes (Signed)
 Patient seen for orthotic casting during visit wth Dr. Janit  Patient will benefit from custom foot orthotics to provide total contact to bilateral medial longitudinal arches to help balance and distribute body weight more evenly.  Thus reducing plantar pressure and pain.   Orthotic will encourage forefoot and rearfoot alignment.    Patient was scanned today with OHI scanner.    Orthotics are ordered.  Signature obtained for notification of pricing/ fees for the device.  When the orthotic is ready for pick up, will call to make an appointment for a fitting.    Onetta Spainhower, DPM

## 2024-06-22 NOTE — Patient Instructions (Signed)
 Orthotics DME (durable medical equipment) code: [phone]

## 2024-06-22 NOTE — Progress Notes (Signed)
 Chief Complaint  Patient presents with   Foot Pain    Pt is here due to right foot pain, states she was seen her for the same issue last year, the pain is at the top, bottom and side of the foot, no injuries pain began 3 weeks ago, has a burning sensation as well, states when the pain begins she rests.    Subjective:  43 y.o. female presenting today for recurrent onset of pain and tenderness associated to the lateral aspect of the right forefoot.  She has a history of this pain about 1 year ago which seemed to resolve.  Over the last few weeks she has noticed a recurrence of the pain.  No history of injury.  No change in activity that she can recall  Brief history: Patient has a history of ORIF right ankle 2008 followed by removal of fibular hardware 2016.  Patient states that in February 2024 she began to experience right ankle pain.  Idiopathic onset.  After few months she began to experience right forefoot pain.  She does admit to compensating with an altered gait.  Denies any history of injury or change in activity.  She was seen at [facility] and also with Ortho care and physical therapy was initiated.    Past Medical History:  Diagnosis Date   Anxiety    Arthritis    right ankle   Bony exostosis 09/2014   right knee   GERD (gastroesophageal reflux disease)    History of migraine    Hypertension    Neuromuscular disorder (HCC)    trigeminal nerve pain   Pain, eye, left 10/10/2014   with tingling - states possible shingles; to see PCP   Painful orthopaedic hardware 09/2014   right ankle    Past Surgical History:  Procedure Laterality Date   BONE EXOSTOSIS EXCISION Right 10/16/2014   Procedure: EXCISION OF BONY EXOSTOSIS FROM RIGHT KNEE;  Surgeon: Kay CHRISTELLA Cummins, MD;  Location: [facility];  Service: Orthopedics;  Laterality: Right;   CHOLECYSTECTOMY  07/12/2006   DILATATION & CURRETTAGE/HYSTEROSCOPY WITH RESECTOCOPE N/A 09/24/2022   Procedure: DILATATION &  CURETTAGE/HYSTEROSCOPY WITH RESECTOCOPE;  Surgeon: Rutherford Gain, MD;  Location: [facility];  Service: Gynecology;  Laterality: N/A;   ENDOMETRIAL ABLATION N/A 09/24/2022   Procedure: ENDOMETRIAL ABLATION;  Surgeon: Rutherford Gain, MD;  Location: [facility];  Service: Gynecology;  Laterality: N/A;   HARDWARE REMOVAL Right 10/16/2014   Procedure: HARDWARE REMOVAL RIGHT ANKLE;  Surgeon: Kay CHRISTELLA Cummins, MD;  Location: [facility];  Service: Orthopedics;  Laterality: Right;   KNEE ARTHROSCOPY Right 09/01/2007   ORIF ANKLE FRACTURE BIMALLEOLAR Right 02/28/2007   ORIF TIBIA FRACTURE Right 02/28/2007   rod placement right   TIBIA HARDWARE REMOVAL Right 09/01/2007    Allergies  Allergen Reactions   Penicillin G Other (See Comments)   Yeast-Derived Drug Products Other (See Comments)    States she avoids yeast products as they cause her yeast infections   Penicillins Other (See Comments)    UNKNOWN - AS AN INFANT    Objective / Physical Exam:  General:  The patient is alert and oriented x3 in no acute distress. Dermatology:  Skin is warm, dry and supple bilateral lower extremities. Negative for open lesions or macerations. Vascular:  Palpable pedal pulses bilaterally. No edema or erythema noted. Capillary refill within normal limits. Neurological:  Grossly intact via light touch Musculoskeletal Exam:  The pain to the right ankle  joint resolved.  There is no pain with palpation or range of motion.  Muscle strength 5/5. There can use to be pain and tenderness to the lateral aspect of the right forefoot most localized around the fifth MTP.  There is pain with palpation and range of motion of the fifth MTP  XR Ankle Complete Right 01/05/2023 No acute fracture noted.  She does have mild degenerative changes  throughout the ankle in addition to a healed fibula fracture.  No hardware complication of the tibial nail.     XR Foot Complete Right  01/05/2023 Narrative No acute or structural abnormalities   Radiographic exam RT foot 06/22/2024 Normal osseous mineralization.  Joint spaces preserved.  No acute fractures identified.  Impression: Negative  Assessment: 1.  Capsulitis right ankle; resolved 2.  Fourth and fifth ray pain right suspicious for stress reaction fracture based on clinical exam  Plan of Care:  -Patient was evaluated. X-Rays reviewed.  -Ankle continues to do well without any significant pain or tenderness.  -Medrol  Dosepak -Meloxicam  15 mg daily after completion of the Dosepak - Patient experienced significant back pain with immobilization in a cam boot last year.  Will refrain from immobilizing in a cam boot -Continue OTC prefabricated power step insoles -I do believe that long-term the patient would benefit from custom molded orthotics to support the medial longitudinal arch of the foot and offload pressure from the forefoot.  Today the patient was molded for custom orthotics -Return to clinic orthotics pickup  Thresa EMERSON Sar, DPM [facility]  Dr. Thresa EMERSON Sar, DPM    [address] [place], [place] [address]                Office [phone]  Fax [phone]

## 2024-06-29 ENCOUNTER — Encounter: Payer: Self-pay | Admitting: Podiatry

## 2024-07-04 ENCOUNTER — Encounter: Payer: Self-pay | Admitting: Oncology

## 2024-07-10 ENCOUNTER — Ambulatory Visit: Admitting: Podiatry

## 2024-07-10 DIAGNOSIS — M7741 Metatarsalgia, right foot: Secondary | ICD-10-CM | POA: Diagnosis not present

## 2024-07-10 DIAGNOSIS — M7751 Other enthesopathy of right foot: Secondary | ICD-10-CM

## 2024-07-10 NOTE — Progress Notes (Signed)
 "  Subjective:  Patient ID: [name], female    DOB: [birth year],  MRN: [id number]  Chief Complaint  Patient presents with   Foot Pain    Right foot pain follow up  Pt stated that she is still having discomfort with her foot     43 y.o. female presents with the above complaint.  Patient presents with complaint of right fifth metatarsophalangeal joint capsulitis she states that has been causing her a lot of discomfort she wanted to get it evaluated she has seen Dr. Janit in the past for it.  Is still having some discomfort.  Pain scale 7 out of 10 dull achy in nature .   Review of Systems: Negative except as noted in the HPI. Denies N/V/F/Ch.  Past Medical History:  Diagnosis Date   Anxiety    Arthritis    right ankle   Bony exostosis 09/2014   right knee   GERD (gastroesophageal reflux disease)    History of migraine    Hypertension    Neuromuscular disorder (HCC)    trigeminal nerve pain   Pain, eye, left 10/10/2014   with tingling - states possible shingles; to see PCP   Painful orthopaedic hardware 09/2014   right ankle   Current Medications[1]  Tobacco Use History[2]  Allergies[3] Objective:  There were no vitals filed for this visit. There is no height or weight on file to calculate BMI. Constitutional Well developed. Well nourished.  Vascular Dorsalis pedis pulses palpable bilaterally. Posterior tibial pulses palpable bilaterally. Capillary refill normal to all digits.  No cyanosis or clubbing noted. Pedal hair growth normal.  Neurologic Normal speech. Oriented to person, place, and time. Epicritic sensation to light touch grossly present bilaterally.  Dermatologic Nails well groomed and normal in appearance. No open wounds. No skin lesions.  Orthopedic: Pain on palpation right fifth metatarsal phalangeal joint pain with range of motion of the joint no deep intra-articular pain noted no extensor flexor tendinitis noted   Radiographs: None Assessment:    1. Metatarsalgia, right foot   2. Capsulitis of metatarsophalangeal (MTP) joint of right foot    Plan:  Patient was evaluated and treated and all questions answered.  Right fifth MTP capsulitis with underlying metatarsalgia - All questions and concerns were discussed with the patient in extensive detail given the amount of pain that she is having she would benefit from steroid injection of decreasing inflammatory, surgical pain.  Patient agrees with plan to proceed with steroid injection -A steroid injection was performed at right fifth MTP using 1% plain Lidocaine  and 10 mg of Kenalog. This was well tolerated. - Shoe gear modification discussed - She will benefit from surgical shoe.  Surgical shoe was dispensed   No follow-ups on file.     [1]  Current Outpatient Medications:    acetaminophen  (TYLENOL ) 500 MG tablet, Take 2 tablets (1,000 mg total) by mouth every 8 (eight) hours., Disp: 30 tablet, Rfl: 0   albuterol (VENTOLIN HFA) 108 (90 Base) MCG/ACT inhaler, Inhale 2 puffs into the lungs every 6 (six) hours as needed for wheezing or shortness of breath., Disp: , Rfl:    apixaban  (ELIQUIS ) 5 MG TABS tablet, Take 1 tablet (5 mg total) by mouth 2 (two) times daily., Disp: 60 tablet, Rfl: 2   budesonide-formoterol (SYMBICORT) 160-4.5 MCG/ACT inhaler, Inhale 2 puffs into the lungs., Disp: , Rfl:    cholecalciferol (VITAMIN D3) 25 MCG (1000 UNIT) tablet, Take 1,000 Units by mouth daily., Disp: , Rfl:  colchicine 0.6 MG tablet, Take 0.6 mg by mouth., Disp: , Rfl:    cyclobenzaprine (FLEXERIL) 10 MG tablet, Take 10 mg by mouth as needed for muscle spasms., Disp: , Rfl:    diclofenac Sodium (VOLTAREN) 1 % GEL, APPLY 2 TO 4 GRAMS AA BID, Disp: , Rfl:    folic acid  (FOLVITE ) 1 MG tablet, Take 1 mg by mouth daily., Disp: , Rfl:    gabapentin  (NEURONTIN ) 300 MG capsule, Take 600 mg by mouth 2 (two) times daily., Disp: , Rfl:    hydrochlorothiazide  (HYDRODIURIL ) 12.5 MG tablet, Take 12.5 mg  by mouth daily., Disp: , Rfl:    lidocaine  (LIDODERM ) 5 %, Place 1 patch onto the skin daily. Remove & Discard patch within 12 hours or as directed by MD, Disp: 10 patch, Rfl: 0   MAGNESIUM GLYCINATE PO, Take 2 capsules by mouth at bedtime. Pt unsure of mg dose, Disp: , Rfl:    meloxicam  (MOBIC ) 15 MG tablet, Take 1 tablet (15 mg total) by mouth daily., Disp: 60 tablet, Rfl: 1   methylPREDNISolone  (MEDROL  DOSEPAK) 4 MG TBPK tablet, 6 day dose pack - take as directed, Disp: 21 tablet, Rfl: 0   metoprolol  succinate (TOPROL -XL) 50 MG 24 hr tablet, Take 1 tablet by mouth daily., Disp: , Rfl:    pantoprazole  (PROTONIX ) 40 MG tablet, Take 40 mg by mouth 2 (two) times daily before a meal., Disp: , Rfl:    polyethylene glycol (MIRALAX  / GLYCOLAX ) 17 g packet, Take 17 g by mouth daily as needed for mild constipation., Disp: 14 each, Rfl: 0   sertraline  (ZOLOFT ) 100 MG tablet, Take 1 tablet (100 mg total) by mouth daily., Disp: 30 tablet, Rfl: 11   traMADol  (ULTRAM ) 50 MG tablet, Take 1 tablet (50 mg total) by mouth every 6 (six) hours as needed., Disp: 10 tablet, Rfl: 0   valACYclovir (VALTREX) 1000 MG tablet, Take 1,000 mg by mouth 2 (two) times daily., Disp: , Rfl:   Current Facility-Administered Medications:    betamethasone  acetate-betamethasone  sodium phosphate  (CELESTONE ) injection 3 mg, 3 mg, Intra-articular, Once,  [2]  Social History Tobacco Use  Smoking Status Former   Current packs/day: 0.25   Types: Cigarettes  Smokeless Tobacco Never  Tobacco Comments   Pt. States she occasionally will smoke not everyday  [3]  Allergies Allergen Reactions   Penicillin G Other (See Comments)   Yeast-Derived Drug Products Other (See Comments)    States she avoids yeast products as they cause her yeast infections   Penicillins Other (See Comments)    UNKNOWN - AS AN INFANT   "

## 2024-07-18 ENCOUNTER — Ambulatory Visit
Admission: RE | Admit: 2024-07-18 | Discharge: 2024-07-18 | Disposition: A | Discharge status: Home / Self Care | Payer: Self-pay | Source: Ambulatory Visit | Attending: Oncology | Admitting: Oncology

## 2024-07-18 ENCOUNTER — Other Ambulatory Visit

## 2024-07-18 ENCOUNTER — Inpatient Hospital Stay: Payer: Self-pay | Attending: Oncology

## 2024-07-18 DIAGNOSIS — K219 Gastro-esophageal reflux disease without esophagitis: Secondary | ICD-10-CM | POA: Diagnosis not present

## 2024-07-18 DIAGNOSIS — M419 Scoliosis, unspecified: Secondary | ICD-10-CM | POA: Insufficient documentation

## 2024-07-18 DIAGNOSIS — Z86711 Personal history of pulmonary embolism: Secondary | ICD-10-CM | POA: Insufficient documentation

## 2024-07-18 DIAGNOSIS — Z803 Family history of malignant neoplasm of breast: Secondary | ICD-10-CM | POA: Insufficient documentation

## 2024-07-18 DIAGNOSIS — M129 Arthropathy, unspecified: Secondary | ICD-10-CM | POA: Diagnosis not present

## 2024-07-18 DIAGNOSIS — D1800 Hemangioma unspecified site: Secondary | ICD-10-CM | POA: Insufficient documentation

## 2024-07-18 DIAGNOSIS — Z87891 Personal history of nicotine dependence: Secondary | ICD-10-CM | POA: Insufficient documentation

## 2024-07-18 DIAGNOSIS — Z791 Long term (current) use of non-steroidal anti-inflammatories (NSAID): Secondary | ICD-10-CM | POA: Diagnosis not present

## 2024-07-18 DIAGNOSIS — Z79624 Long term (current) use of inhibitors of nucleotide synthesis: Secondary | ICD-10-CM | POA: Diagnosis not present

## 2024-07-18 DIAGNOSIS — I2699 Other pulmonary embolism without acute cor pulmonale: Secondary | ICD-10-CM | POA: Insufficient documentation

## 2024-07-18 DIAGNOSIS — Z7901 Long term (current) use of anticoagulants: Secondary | ICD-10-CM | POA: Insufficient documentation

## 2024-07-18 DIAGNOSIS — Z79899 Other long term (current) drug therapy: Secondary | ICD-10-CM | POA: Insufficient documentation

## 2024-07-18 DIAGNOSIS — Z7951 Long term (current) use of inhaled steroids: Secondary | ICD-10-CM | POA: Insufficient documentation

## 2024-07-18 DIAGNOSIS — Z800 Family history of malignant neoplasm of digestive organs: Secondary | ICD-10-CM | POA: Insufficient documentation

## 2024-07-18 DIAGNOSIS — R911 Solitary pulmonary nodule: Secondary | ICD-10-CM | POA: Insufficient documentation

## 2024-07-18 DIAGNOSIS — R079 Chest pain, unspecified: Secondary | ICD-10-CM | POA: Insufficient documentation

## 2024-07-18 DIAGNOSIS — I10 Essential (primary) hypertension: Secondary | ICD-10-CM | POA: Diagnosis not present

## 2024-07-18 LAB — CBC WITH DIFFERENTIAL (CANCER CENTER ONLY)
Abs Immature Granulocytes: 0.02 K/uL (ref 0.00–0.07)
Basophils Absolute: 0 K/uL (ref 0.0–0.1)
Basophils Relative: 0 %
Eosinophils Absolute: 0 K/uL (ref 0.0–0.5)
Eosinophils Relative: 0 %
HCT: 39.6 % (ref 36.0–46.0)
Hemoglobin: 12.8 g/dL (ref 12.0–15.0)
Immature Granulocytes: 0 %
Lymphocytes Relative: 23 %
Lymphs Abs: 1.6 K/uL (ref 0.7–4.0)
MCH: 27.9 pg (ref 26.0–34.0)
MCHC: 32.3 g/dL (ref 30.0–36.0)
MCV: 86.3 fL (ref 80.0–100.0)
Monocytes Absolute: 0.4 K/uL (ref 0.1–1.0)
Monocytes Relative: 5 %
Neutro Abs: 5 K/uL (ref 1.7–7.7)
Neutrophils Relative %: 72 %
Platelet Count: 351 K/uL (ref 150–400)
RBC: 4.59 MIL/uL (ref 3.87–5.11)
RDW: 14.5 % (ref 11.5–15.5)
WBC Count: 7.1 K/uL (ref 4.0–10.5)
nRBC: 0 % (ref 0.0–0.2)

## 2024-07-18 LAB — CMP (CANCER CENTER ONLY)
ALT: 11 U/L (ref 0–44)
AST: 16 U/L (ref 15–41)
Albumin: 4.3 g/dL (ref 3.5–5.0)
Alkaline Phosphatase: 93 U/L (ref 38–126)
Anion gap: 11 (ref 5–15)
BUN: 11 mg/dL (ref 6–20)
CO2: 22 mmol/L (ref 22–32)
Calcium: 9.3 mg/dL (ref 8.9–10.3)
Chloride: 103 mmol/L (ref 98–111)
Creatinine: 0.82 mg/dL (ref 0.44–1.00)
GFR, Estimated: >60 mL/min
Glucose, Bld: 103 mg/dL — ABNORMAL HIGH (ref 70–99)
Potassium: 4.1 mmol/L (ref 3.5–5.1)
Sodium: 137 mmol/L (ref 135–145)
Total Bilirubin: 0.5 mg/dL (ref 0.0–1.2)
Total Protein: 7.5 g/dL (ref 6.5–8.1)

## 2024-07-18 LAB — D-DIMER, QUANTITATIVE: D-Dimer, Quant: 0.29 ug{FEU}/mL (ref 0.00–0.50)

## 2024-07-18 MED ORDER — IOHEXOL 350 MG/ML SOLN
75.0000 mL | Freq: Once | INTRAVENOUS | Status: AC | PRN
  Administered 2024-07-18: 75 mL via INTRAVENOUS

## 2024-07-25 ENCOUNTER — Encounter: Payer: Self-pay | Admitting: Oncology

## 2024-07-25 ENCOUNTER — Inpatient Hospital Stay: Payer: Self-pay | Admitting: Oncology

## 2024-07-25 VITALS — BP 123/83 | HR 64 | Temp 96.3°F | Resp 18 | Wt 205.5 lb

## 2024-07-25 DIAGNOSIS — Z86711 Personal history of pulmonary embolism: Secondary | ICD-10-CM

## 2024-07-25 DIAGNOSIS — R911 Solitary pulmonary nodule: Secondary | ICD-10-CM

## 2024-07-25 MED ORDER — APIXABAN 2.5 MG PO TABS: 2.5000 mg | ORAL_TABLET | Freq: Two times a day (BID) | ORAL | 1 refills | Status: AC

## 2024-07-25 NOTE — Progress Notes (Signed)
 " Hematology/Oncology Consult note [phone] [phone]        REFERRING PROVIDER: Valora Lynwood FALCON, MD   CHIEF COMPLAINTS/REASON FOR VISIT:  acute pulmonary embolism, lung nodule.   ASSESSMENT & PLAN:   History of pulmonary embolism Unprovoked acute pulmonary embolism. Hypercoagulable workup results  Anticardiolipin IgM 15, no clinical significance.Decreased protein S activity- repeat protein S activity is normal. Elevated homocystine level, this could be secondary to vitamin deficiency, MTHFR mutation etc.  I recommend not to check MTHFR mutation as manipulating homocystine level does not lower thrombosis risk.  CT scan showed no PE.  S/p 6 months of therapeutic anticoagulation. Recommend patient to decrease Eliquis  to 2.5mg  BID   Nodule of right lung Lung nodule is stable on CT scan.  Recommend repeat CT chest without contrast in 1 year   Orders Placed This Encounter  Procedures   CBC with Differential (Cancer Center Only)    Standing Status:   Future    Expected Date:   01/22/2025    Expiration Date:   04/22/2025   CMP (Cancer Center only)    Standing Status:   Future    Expected Date:   01/22/2025    Expiration Date:   04/22/2025   Follow-up in 6 months. All questions were answered. The patient knows to call the clinic with any problems, questions or concerns.  [name], MD, PhD [facility] Hematology Oncology 07/25/2024   HISTORY OF PRESENTING ILLNESS:   [name] is a  44 y.o.  female with PMH listed below was seen in consultation at the request of  Valora Lynwood FALCON, MD  for evaluation of acute unprovoked pulmonary embolism.  12/14/2023 - 12/16/2023, patient was hospitalized due to left chest pain, sweating and chills.  He went to emergency room for evaluation.  12/14/2023, CT angiogram chest PE protocol showed acute pulmonary emboli in the left lower lobe segmental pulmonary arteries, moderate clot burden.  No evidence of right heart  strain.  Small pleural flow densities in the left lower lobe could represent small infarctions.  Right lung solid nodule within the upper lobe measuring 9 mm.  Patient is someday smoker, quit recently after this admission.  Bilateral lower extremity venous ultrasound is negative for DVT  Patient was admitted and started on anticoagulation.  She was discharged on Eliquis  10 mg twice daily for 7 days followed by 5 mg twice daily. Currently she is on 5 mg twice daily.  She tolerates well.  Denies any bleeding events.  He denies any triggering immobilization factors for this event.  This is her first thrombosis event.  Family history of DVT. Since started on anticoagulation, chest pain has subsided.  No shortness of breath.  During this hospitalization, hypercoagulable workup was obtained. Patient has negative factor V Leiden mutation, prothrombin gene mutation. Slightly elevated cardiolipin IgM 15, normal IgG level.  Negative beta-2  glycoprotein.  Negative lupus anticoagulant. Patient has slightly decreased protein S activity.  Normal protein C antigen activity.  Increased homocystine level at 32.3.  INTERVAL HISTORY [name] is a 44 y.o. female who has above history reviewed by me today presents for follow up visit for history of unprovoked acute pulm embolism.  Patient takes Eliquis  5 mg twice daily.  She reports feeling well.  Denies shortness of breath, chest pain or acute bleeding events.    MEDICAL HISTORY:  Past Medical History:  Diagnosis Date   Anxiety    Arthritis    right ankle   Bony exostosis 09/2014  right knee   GERD (gastroesophageal reflux disease)    History of migraine    Hypertension    Neuromuscular disorder (HCC)    trigeminal nerve pain   Pain, eye, left 10/10/2014   with tingling - states possible shingles; to see PCP   Painful orthopaedic hardware 09/2014   right ankle    SURGICAL HISTORY: Past Surgical History:  Procedure Laterality Date   BONE  EXOSTOSIS EXCISION Right 10/16/2014   Procedure: EXCISION OF BONY EXOSTOSIS FROM RIGHT KNEE;  Surgeon: Kay CHRISTELLA Cummins, MD;  Location: [facility];  Service: Orthopedics;  Laterality: Right;   CHOLECYSTECTOMY  07/12/2006   DILATATION & CURRETTAGE/HYSTEROSCOPY WITH RESECTOCOPE N/A 09/24/2022   Procedure: DILATATION & CURETTAGE/HYSTEROSCOPY WITH RESECTOCOPE;  Surgeon: Rutherford Gain, MD;  Location: [facility] [place];  Service: Gynecology;  Laterality: N/A;   ENDOMETRIAL ABLATION N/A 09/24/2022   Procedure: ENDOMETRIAL ABLATION;  Surgeon: Rutherford Gain, MD;  Location: [facility] [place];  Service: Gynecology;  Laterality: N/A;   HARDWARE REMOVAL Right 10/16/2014   Procedure: HARDWARE REMOVAL RIGHT ANKLE;  Surgeon: Kay CHRISTELLA Cummins, MD;  Location: [facility];  Service: Orthopedics;  Laterality: Right;   KNEE ARTHROSCOPY Right 09/01/2007   ORIF ANKLE FRACTURE BIMALLEOLAR Right 02/28/2007   ORIF TIBIA FRACTURE Right 02/28/2007   rod placement right   TIBIA HARDWARE REMOVAL Right 09/01/2007    SOCIAL HISTORY: Social History   Socioeconomic History   Marital status: Single    Spouse name: Not on file   Number of children: Not on file   Years of education: Not on file   Highest education level: Not on file  Occupational History   Not on file  Tobacco Use   Smoking status: Former    Current packs/day: 0.25    Types: Cigarettes   Smokeless tobacco: Never   Tobacco comments:    Pt. States she occasionally will smoke not everyday  Vaping Use   Vaping status: Never Used  Substance and Sexual Activity   Alcohol use: Not Currently    Comment: occasionally   Drug use: No   Sexual activity: Not Currently    Birth control/protection: None  Other Topics Concern   Not on file  Social History Narrative   Not on file   Social Drivers of Health   Tobacco Use: Medium Risk (07/25/2024)   Patient History    Smoking Tobacco Use: Former     Smokeless Tobacco Use: Never    Passive Exposure: Not on Actuary Strain: Low Risk  (05/14/2024)   Received from [facility]   Overall Financial Resource Strain (CARDIA)    Difficulty of Paying Living Expenses: Not hard at all  Food Insecurity: No Food Insecurity (05/14/2024)   Received from [facility]   Epic    Within the past 12 months, you worried that your food would run out before you got the money to buy more.: Never true    Within the past 12 months, the food you bought just didn't last and you didn't have money to get more.: Never true  Transportation Needs: No Transportation Needs (05/14/2024)   Received from [facility]   PRAPARE - Transportation    Lack of Transportation (Non-Medical): No    In the past 12 months, has lack of transportation kept you from medical appointments or from getting medications?: No  Physical Activity: Not on file  Stress: Not on file  Social Connections: Not  on file  Intimate Partner Violence: Not At Risk (01/05/2024)   Epic    Fear of Current or Ex-Partner: No    Emotionally Abused: No    Physically Abused: No    Sexually Abused: No  Depression (PHQ2-9): Low Risk (01/05/2024)   Depression (PHQ2-9)    PHQ-2 Score: 0  Alcohol Screen: Not on file  Housing: Low Risk  (05/14/2024)   Received from [facility]   Epic    At any time in the past 12 months, were you homeless or living in a shelter (including now)?: No    In the past 12 months, how many times have you moved where you were living?: 0    In the last 12 months, was there a time when you were not able to pay the mortgage or rent on time?: No  Utilities: Not At Risk (05/14/2024)   Received from [facility]   Epic    In the past 12 months has the electric, gas, oil, or water company threatened to shut off services in your home?: No  Health Literacy: Not on file    FAMILY HISTORY: Family  History  Problem Relation Age of Onset   Lupus Mother    Heart disease Father    Rheum arthritis Father    Myasthenia gravis Maternal Grandmother    Diabetes Paternal Grandmother    Breast cancer Paternal Grandmother 38   Colon cancer Paternal Uncle    Colon cancer Maternal Uncle     ALLERGIES:  is allergic to penicillin g, yeast-derived drug products, and penicillins.  MEDICATIONS:  Current Outpatient Medications  Medication Sig Dispense Refill   acetaminophen  (TYLENOL ) 500 MG tablet Take 2 tablets (1,000 mg total) by mouth every 8 (eight) hours. 30 tablet 0   albuterol (VENTOLIN HFA) 108 (90 Base) MCG/ACT inhaler Inhale 2 puffs into the lungs every 6 (six) hours as needed for wheezing or shortness of breath.     apixaban  (ELIQUIS ) 2.5 MG TABS tablet Take 1 tablet (2.5 mg total) by mouth 2 (two) times daily. 180 tablet 1   budesonide-formoterol (SYMBICORT) 160-4.5 MCG/ACT inhaler Inhale 2 puffs into the lungs.     cholecalciferol (VITAMIN D3) 25 MCG (1000 UNIT) tablet Take 1,000 Units by mouth daily.     colchicine 0.6 MG tablet Take 0.6 mg by mouth.     cyclobenzaprine (FLEXERIL) 10 MG tablet Take 10 mg by mouth as needed for muscle spasms.     diclofenac Sodium (VOLTAREN) 1 % GEL APPLY 2 TO 4 GRAMS AA BID     folic acid  (FOLVITE ) 1 MG tablet Take 1 mg by mouth daily.     gabapentin  (NEURONTIN ) 300 MG capsule Take 600 mg by mouth 2 (two) times daily.     hydrochlorothiazide  (HYDRODIURIL ) 12.5 MG tablet Take 12.5 mg by mouth daily.     lidocaine  (LIDODERM ) 5 % Place 1 patch onto the skin daily. Remove & Discard patch within 12 hours or as directed by MD 10 patch 0   MAGNESIUM GLYCINATE PO Take 2 capsules by mouth at bedtime. Pt unsure of mg dose     meloxicam  (MOBIC ) 15 MG tablet Take 1 tablet (15 mg total) by mouth daily. 60 tablet 1   methylPREDNISolone  (MEDROL  DOSEPAK) 4 MG TBPK tablet 6 day dose pack - take as directed 21 tablet 0   metoprolol  succinate (TOPROL -XL) 50 MG 24 hr  tablet Take 1 tablet by mouth daily.     pantoprazole  (PROTONIX )  40 MG tablet Take 40 mg by mouth 2 (two) times daily before a meal.     polyethylene glycol (MIRALAX  / GLYCOLAX ) 17 g packet Take 17 g by mouth daily as needed for mild constipation. 14 each 0   sertraline  (ZOLOFT ) 100 MG tablet Take 1 tablet (100 mg total) by mouth daily. 30 tablet 11   traMADol  (ULTRAM ) 50 MG tablet Take 1 tablet (50 mg total) by mouth every 6 (six) hours as needed. 10 tablet 0   valACYclovir (VALTREX) 1000 MG tablet Take 1,000 mg by mouth 2 (two) times daily.     Current Facility-Administered Medications  Medication Dose Route Frequency Provider Last Rate Last Admin   betamethasone  acetate-betamethasone  sodium phosphate  (CELESTONE ) injection 3 mg  3 mg Intra-articular Once         Review of Systems  Constitutional:  Negative for appetite change, chills, fatigue and fever.  HENT:   Negative for hearing loss and voice change.   Eyes:  Negative for eye problems.  Respiratory:  Negative for chest tightness and cough.   Cardiovascular:  Negative for chest pain.  Gastrointestinal:  Negative for abdominal distention, abdominal pain and blood in stool.  Endocrine: Negative for hot flashes.  Genitourinary:  Negative for difficulty urinating and frequency.   Musculoskeletal:  Negative for arthralgias.  Skin:  Negative for itching and rash.  Neurological:  Negative for extremity weakness.  Hematological:  Negative for adenopathy.  Psychiatric/Behavioral:  Negative for confusion.    PHYSICAL EXAMINATION: ECOG PERFORMANCE STATUS: 0 - Asymptomatic Vitals:   07/25/24 1528  BP: 123/83  Pulse: 64  Resp: 18  Temp: (!) 96.3 F (35.7 C)  SpO2: 100%   Filed Weights   07/25/24 1528  Weight: 205 lb 8 oz (93.2 kg)    Physical Exam Constitutional:      General: She is not in acute distress. HENT:     Head: Normocephalic and atraumatic.  Eyes:     General: No scleral icterus. Cardiovascular:     Rate and  Rhythm: Normal rate and regular rhythm.     Heart sounds: Normal heart sounds.  Pulmonary:     Effort: Pulmonary effort is normal. No respiratory distress.     Breath sounds: No wheezing.  Abdominal:     General: Bowel sounds are normal. There is no distension.     Palpations: Abdomen is soft.  Musculoskeletal:        General: No deformity. Normal range of motion.     Cervical back: Normal range of motion and neck supple.  Skin:    General: Skin is warm and dry.     Findings: No erythema or rash.  Neurological:     Mental Status: She is alert and oriented to person, place, and time. Mental status is at baseline.     Cranial Nerves: No cranial nerve deficit.     Coordination: Coordination normal.  Psychiatric:        Mood and Affect: Mood normal.     LABORATORY DATA:  I have reviewed the data as listed    Latest Ref Rng & Units 07/18/2024    7:52 AM 04/16/2024    2:12 PM 01/05/2024    4:07 PM  CBC  WBC 4.0 - 10.5 K/uL 7.1  9.1  5.3   Hemoglobin 12.0 - 15.0 g/dL 87.1  87.6  87.3   Hematocrit 36.0 - 46.0 % 39.6  37.9  37.5   Platelets 150 - 400 K/uL 351  265  103  Latest Ref Rng & Units 07/18/2024    7:52 AM 04/16/2024    2:12 PM 12/15/2023    4:48 AM  CMP  Glucose 70 - 99 mg/dL 896  87  896   BUN 6 - 20 mg/dL 11  9  9    Creatinine 0.44 - 1.00 mg/dL 9.17  9.31  9.42   Sodium 135 - 145 mmol/L 137  135  143   Potassium 3.5 - 5.1 mmol/L 4.1  3.7  3.1   Chloride 98 - 111 mmol/L 103  101  107   CO2 22 - 32 mmol/L 22  25  28    Calcium 8.9 - 10.3 mg/dL 9.3  9.4  8.9   Total Protein 6.5 - 8.1 g/dL 7.5  7.8    Total Bilirubin 0.0 - 1.2 mg/dL 0.5  0.5    Alkaline Phos 38 - 126 U/L 93  86    AST 15 - 41 U/L 16  20    ALT 0 - 44 U/L 11  18        RADIOGRAPHIC STUDIES: I have personally reviewed the radiological images as listed and agreed with the findings in the report. CT Angio Chest Pulmonary Embolism (PE) W or WO Contrast Result Date: 07/18/2024 CLINICAL DATA:  History  of pulmonary embolus. EXAM: CT ANGIOGRAPHY CHEST WITH CONTRAST TECHNIQUE: Multidetector CT imaging of the chest was performed using the standard protocol during bolus administration of intravenous contrast. Multiplanar CT image reconstructions and MIPs were obtained to evaluate the vascular anatomy. RADIATION DOSE REDUCTION: This exam was performed according to the departmental dose-optimization program which includes automated exposure control, adjustment of the mA and/or kV according to patient size and/or use of iterative reconstruction technique. CONTRAST:  75mL OMNIPAQUE  IOHEXOL  350 MG/ML SOLN COMPARISON:  04/06/2024 FINDINGS: Cardiovascular: Heart is upper normal to borderline enlarged. No substantial pericardial effusion. No thoracic aortic aneurysm. No substantial atherosclerotic calcification of the thoracic aorta. There is no filling defect within the opacified pulmonary arteries to suggest the presence of an acute pulmonary embolus. The tiny filling defect identified on the previous study in a right lower lobe medial basilar subsegmental branch is no longer evident. Mediastinum/Nodes: Scattered small mediastinal lymph nodes are similar. There is no hilar lymphadenopathy. The esophagus has normal imaging features. There is no axillary lymphadenopathy. Lungs/Pleura: 9 x 7 mm right parahilar nodule on 51/6 is stable in the interval no new suspicious pulmonary nodule or mass. Chronic atelectasis or scarring in the anterior and basilar lingula is unchanged. No pulmonary edema or pleural effusion. Upper Abdomen: Visualized portion of the upper abdomen shows no acute findings. Musculoskeletal: No worrisome lytic or sclerotic osseous abnormality. Review of the MIP images confirms the above findings. IMPRESSION: 1. No CT evidence for acute pulmonary embolus. The tiny filling defect identified on the previous study in a right lower lobe medial basilar subsegmental branch is no longer evident. 2. Stable 9 x 7 mm  right parahilar nodule. While interval stability is reassuring, continued attention warranted. Electronically Signed   By: Camellia Candle M.D.   On: 07/18/2024 07:46   DG Foot Complete Right Result Date: 06/22/2024 Please see detailed radiograph report in office note.  MR THORACIC SPINE WO CONTRAST Result Date: 05/24/2024 MR THORACIC SPINE WITHOUT IV CONTRAST COMPARISON: None available CLINICAL HISTORY: Thoracic radiculitis, back pain. Left arm PICC pain. TECHNIQUE: T1, T2 and STIR sagittal images were performed. Axial T2-weighted images were performed through the thoracic spine without IV contrast. FINDINGS: There is mild dextroscoliosis  of the thoracic spine with mild disc desiccation. There are a few scattered benign hemangiomas throughout the thoracic spine. There is no focal extrusion, foraminal or spinal stenosis. There is no vertebral body height loss, subluxation or marrow replacing process. The thoracic cord is unremarkable. There is a partially imaged nodule in the right midlung measuring approximately 9 mm in size. Correlation with CT of the chest results. Otherwise, the paraspinal structures demonstrate no significant abnormality. IMPRESSION: Mild degenerative spondylosis and slight dextroscoliosis. No acute abnormality. No focal extrusion, foraminal or spinal stenosis. Right lung nodule. See CT chest results for more detail. Electronically signed by: Norleen Satchel MD 05/24/2024 01:13 PM EST RP Workstation: [id number]         "

## 2024-07-25 NOTE — Assessment & Plan Note (Addendum)
 Unprovoked acute pulmonary embolism. Hypercoagulable workup results  Anticardiolipin IgM 15, no clinical significance.Decreased protein S activity- repeat protein S activity is normal. Elevated homocystine level, this could be secondary to vitamin deficiency, MTHFR mutation etc.  I recommend not to check MTHFR mutation as manipulating homocystine level does not lower thrombosis risk.  CT scan showed no PE.  S/p 6 months of therapeutic anticoagulation. Recommend patient to decrease Eliquis  to 2.5mg  BID

## 2024-07-25 NOTE — Assessment & Plan Note (Signed)
 Lung nodule is stable on CT scan.  Recommend repeat CT chest without contrast in 1 year

## 2024-07-27 ENCOUNTER — Telehealth: Payer: Self-pay | Admitting: Podiatry

## 2024-07-27 NOTE — Telephone Encounter (Signed)
 Orthotics in [facility] patient scheduled 1/20 for appt [facility]

## 2024-07-31 ENCOUNTER — Other Ambulatory Visit: Payer: Self-pay

## 2024-08-01 ENCOUNTER — Encounter: Payer: Self-pay | Admitting: Podiatry

## 2024-08-02 ENCOUNTER — Other Ambulatory Visit: Payer: Self-pay | Admitting: Podiatry

## 2024-08-02 MED ORDER — METHYLPREDNISOLONE 4 MG PO TBPK: ORAL_TABLET | ORAL | 0 refills | Status: AC

## 2024-08-10 ENCOUNTER — Other Ambulatory Visit

## 2025-02-04 ENCOUNTER — Inpatient Hospital Stay

## 2025-02-04 ENCOUNTER — Inpatient Hospital Stay: Admitting: Oncology
